# Patient Record
Sex: Female | Born: 1940 | Race: White | Hispanic: No | Marital: Married | Smoking: Never smoker
Health system: Southern US, Community
[De-identification: ages and names within clinical notes are randomized; demographics above are authoritative.]

## PROBLEM LIST (undated history)

## (undated) DIAGNOSIS — N1330 Unspecified hydronephrosis: Secondary | ICD-10-CM

## (undated) DIAGNOSIS — E785 Hyperlipidemia, unspecified: Secondary | ICD-10-CM

## (undated) DIAGNOSIS — K219 Gastro-esophageal reflux disease without esophagitis: Secondary | ICD-10-CM

## (undated) DIAGNOSIS — K222 Esophageal obstruction: Secondary | ICD-10-CM

## (undated) DIAGNOSIS — K635 Polyp of colon: Secondary | ICD-10-CM

## (undated) DIAGNOSIS — G231 Progressive supranuclear ophthalmoplegia [Steele-Richardson-Olszewski]: Secondary | ICD-10-CM

## (undated) DIAGNOSIS — N319 Neuromuscular dysfunction of bladder, unspecified: Secondary | ICD-10-CM

## (undated) DIAGNOSIS — G709 Myoneural disorder, unspecified: Secondary | ICD-10-CM

## (undated) DIAGNOSIS — E876 Hypokalemia: Secondary | ICD-10-CM

## (undated) DIAGNOSIS — K648 Other hemorrhoids: Secondary | ICD-10-CM

## (undated) DIAGNOSIS — I10 Essential (primary) hypertension: Secondary | ICD-10-CM

## (undated) DIAGNOSIS — M1990 Unspecified osteoarthritis, unspecified site: Secondary | ICD-10-CM

## (undated) DIAGNOSIS — G20 Parkinson's disease: Secondary | ICD-10-CM

## (undated) DIAGNOSIS — I6381 Other cerebral infarction due to occlusion or stenosis of small artery: Secondary | ICD-10-CM

## (undated) DIAGNOSIS — G20C Parkinsonism, unspecified: Secondary | ICD-10-CM

## (undated) DIAGNOSIS — N390 Urinary tract infection, site not specified: Secondary | ICD-10-CM

## (undated) HISTORY — PX: OTHER SURGICAL HISTORY: SHX169

## (undated) HISTORY — DX: Polyp of colon: K63.5

## (undated) HISTORY — DX: Urinary tract infection, site not specified: N39.0

## (undated) HISTORY — DX: Essential (primary) hypertension: I10

## (undated) HISTORY — DX: Progressive supranuclear ophthalmoplegia (steele-Richardson-olszewski): G23.1

## (undated) HISTORY — DX: Unspecified osteoarthritis, unspecified site: M19.90

## (undated) HISTORY — DX: Parkinson's disease: G20

## (undated) HISTORY — DX: Parkinsonism, unspecified: G20.C

## (undated) HISTORY — DX: Other cerebral infarction due to occlusion or stenosis of small artery: I63.81

## (undated) HISTORY — DX: Unspecified hydronephrosis: N13.30

## (undated) HISTORY — PX: TUBAL LIGATION: SHX77

## (undated) HISTORY — DX: Myoneural disorder, unspecified: G70.9

## (undated) HISTORY — DX: Hyperlipidemia, unspecified: E78.5

## (undated) HISTORY — DX: Other hemorrhoids: K64.8

## (undated) HISTORY — DX: Hypokalemia: E87.6

## (undated) HISTORY — DX: Gastro-esophageal reflux disease without esophagitis: K21.9

## (undated) HISTORY — DX: Neuromuscular dysfunction of bladder, unspecified: N31.9

## (undated) HISTORY — PX: CARPAL TUNNEL RELEASE: SHX101

## (undated) HISTORY — PX: CHOLECYSTECTOMY: SHX55

## (undated) HISTORY — PX: ESOPHAGEAL DILATION: SHX303

## (undated) HISTORY — DX: Esophageal obstruction: K22.2

## (undated) HISTORY — PX: KNEE SURGERY: SHX244

## (undated) HISTORY — PX: BREAST SURGERY: SHX581

---

## 1997-05-14 ENCOUNTER — Other Ambulatory Visit: Admission: RE | Admit: 1997-05-14 | Discharge: 1997-05-14 | Payer: Self-pay | Admitting: Gynecology

## 1997-06-04 ENCOUNTER — Encounter: Payer: Self-pay | Admitting: Internal Medicine

## 1998-05-29 ENCOUNTER — Emergency Department (HOSPITAL_COMMUNITY): Admission: EM | Admit: 1998-05-29 | Discharge: 1998-05-29 | Payer: Self-pay | Admitting: Emergency Medicine

## 1998-05-30 ENCOUNTER — Other Ambulatory Visit: Admission: RE | Admit: 1998-05-30 | Discharge: 1998-05-30 | Payer: Self-pay | Admitting: Gynecology

## 1998-12-23 ENCOUNTER — Other Ambulatory Visit: Admission: RE | Admit: 1998-12-23 | Discharge: 1998-12-23 | Payer: Self-pay | Admitting: Gynecology

## 1999-01-16 ENCOUNTER — Ambulatory Visit (HOSPITAL_COMMUNITY): Admission: RE | Admit: 1999-01-16 | Discharge: 1999-01-16 | Payer: Self-pay | Admitting: Internal Medicine

## 1999-01-16 ENCOUNTER — Encounter: Payer: Self-pay | Admitting: Internal Medicine

## 1999-07-24 ENCOUNTER — Other Ambulatory Visit: Admission: RE | Admit: 1999-07-24 | Discharge: 1999-07-24 | Payer: Self-pay | Admitting: Gynecology

## 1999-07-24 ENCOUNTER — Encounter: Admission: RE | Admit: 1999-07-24 | Discharge: 1999-07-24 | Payer: Self-pay | Admitting: Gynecology

## 1999-07-24 ENCOUNTER — Encounter: Payer: Self-pay | Admitting: Gynecology

## 1999-08-14 ENCOUNTER — Other Ambulatory Visit: Admission: RE | Admit: 1999-08-14 | Discharge: 1999-08-14 | Payer: Self-pay | Admitting: Gynecology

## 1999-08-14 ENCOUNTER — Encounter (INDEPENDENT_AMBULATORY_CARE_PROVIDER_SITE_OTHER): Payer: Self-pay

## 1999-12-22 ENCOUNTER — Other Ambulatory Visit: Admission: RE | Admit: 1999-12-22 | Discharge: 1999-12-22 | Payer: Self-pay | Admitting: Gynecology

## 2000-07-26 ENCOUNTER — Encounter: Payer: Self-pay | Admitting: Gynecology

## 2000-07-26 ENCOUNTER — Encounter: Admission: RE | Admit: 2000-07-26 | Discharge: 2000-07-26 | Payer: Self-pay | Admitting: Gynecology

## 2000-07-26 ENCOUNTER — Other Ambulatory Visit: Admission: RE | Admit: 2000-07-26 | Discharge: 2000-07-26 | Payer: Self-pay | Admitting: Gynecology

## 2001-03-25 ENCOUNTER — Other Ambulatory Visit: Admission: RE | Admit: 2001-03-25 | Discharge: 2001-03-25 | Payer: Self-pay | Admitting: Gynecology

## 2001-07-24 ENCOUNTER — Emergency Department (HOSPITAL_COMMUNITY): Admission: EM | Admit: 2001-07-24 | Discharge: 2001-07-24 | Payer: Self-pay | Admitting: Emergency Medicine

## 2001-07-24 ENCOUNTER — Encounter: Payer: Self-pay | Admitting: Emergency Medicine

## 2002-03-27 ENCOUNTER — Other Ambulatory Visit: Admission: RE | Admit: 2002-03-27 | Discharge: 2002-03-27 | Payer: Self-pay | Admitting: Gynecology

## 2002-06-02 ENCOUNTER — Emergency Department (HOSPITAL_COMMUNITY): Admission: EM | Admit: 2002-06-02 | Discharge: 2002-06-03 | Payer: Self-pay | Admitting: Emergency Medicine

## 2002-06-03 ENCOUNTER — Encounter: Payer: Self-pay | Admitting: Emergency Medicine

## 2002-06-08 ENCOUNTER — Ambulatory Visit (HOSPITAL_COMMUNITY): Admission: RE | Admit: 2002-06-08 | Discharge: 2002-06-08 | Payer: Self-pay | Admitting: Internal Medicine

## 2002-06-08 ENCOUNTER — Encounter: Payer: Self-pay | Admitting: Internal Medicine

## 2002-07-25 ENCOUNTER — Encounter: Payer: Self-pay | Admitting: Gynecology

## 2002-07-25 ENCOUNTER — Encounter: Admission: RE | Admit: 2002-07-25 | Discharge: 2002-07-25 | Payer: Self-pay | Admitting: Gynecology

## 2003-04-24 ENCOUNTER — Encounter: Admission: RE | Admit: 2003-04-24 | Discharge: 2003-04-24 | Payer: Self-pay | Admitting: Internal Medicine

## 2003-05-02 ENCOUNTER — Encounter: Admission: RE | Admit: 2003-05-02 | Discharge: 2003-05-02 | Payer: Self-pay | Admitting: Internal Medicine

## 2003-06-11 ENCOUNTER — Other Ambulatory Visit: Admission: RE | Admit: 2003-06-11 | Discharge: 2003-06-11 | Payer: Self-pay | Admitting: Gynecology

## 2003-12-07 ENCOUNTER — Ambulatory Visit: Payer: Self-pay | Admitting: Internal Medicine

## 2004-07-08 ENCOUNTER — Ambulatory Visit: Payer: Self-pay | Admitting: Internal Medicine

## 2004-07-18 ENCOUNTER — Ambulatory Visit: Payer: Self-pay | Admitting: Internal Medicine

## 2004-08-04 ENCOUNTER — Encounter: Admission: RE | Admit: 2004-08-04 | Discharge: 2004-08-04 | Payer: Self-pay | Admitting: Gynecology

## 2004-08-04 ENCOUNTER — Other Ambulatory Visit: Admission: RE | Admit: 2004-08-04 | Discharge: 2004-08-04 | Payer: Self-pay | Admitting: Gynecology

## 2004-11-18 ENCOUNTER — Ambulatory Visit: Payer: Self-pay | Admitting: Internal Medicine

## 2005-01-28 ENCOUNTER — Ambulatory Visit: Payer: Self-pay | Admitting: Internal Medicine

## 2005-02-09 ENCOUNTER — Ambulatory Visit: Payer: Self-pay | Admitting: Internal Medicine

## 2005-03-04 ENCOUNTER — Ambulatory Visit: Payer: Self-pay | Admitting: Internal Medicine

## 2005-03-09 ENCOUNTER — Ambulatory Visit: Payer: Self-pay | Admitting: Internal Medicine

## 2005-07-09 ENCOUNTER — Ambulatory Visit (HOSPITAL_COMMUNITY): Admission: RE | Admit: 2005-07-09 | Discharge: 2005-07-09 | Payer: Self-pay | Admitting: Gastroenterology

## 2005-07-09 ENCOUNTER — Encounter: Payer: Self-pay | Admitting: Gastroenterology

## 2005-07-14 ENCOUNTER — Ambulatory Visit: Payer: Self-pay | Admitting: Gastroenterology

## 2005-08-18 ENCOUNTER — Other Ambulatory Visit: Admission: RE | Admit: 2005-08-18 | Discharge: 2005-08-18 | Payer: Self-pay | Admitting: Gynecology

## 2006-05-31 ENCOUNTER — Ambulatory Visit: Payer: Self-pay | Admitting: Internal Medicine

## 2006-08-09 ENCOUNTER — Ambulatory Visit: Payer: Self-pay | Admitting: Internal Medicine

## 2006-08-09 DIAGNOSIS — E8881 Metabolic syndrome: Secondary | ICD-10-CM

## 2006-08-13 ENCOUNTER — Encounter (INDEPENDENT_AMBULATORY_CARE_PROVIDER_SITE_OTHER): Payer: Self-pay | Admitting: *Deleted

## 2006-08-17 ENCOUNTER — Ambulatory Visit: Payer: Self-pay | Admitting: Internal Medicine

## 2006-08-18 ENCOUNTER — Encounter (INDEPENDENT_AMBULATORY_CARE_PROVIDER_SITE_OTHER): Payer: Self-pay | Admitting: *Deleted

## 2006-09-02 ENCOUNTER — Encounter: Admission: RE | Admit: 2006-09-02 | Discharge: 2006-09-02 | Payer: Self-pay | Admitting: Gynecology

## 2006-09-02 ENCOUNTER — Encounter: Payer: Self-pay | Admitting: Internal Medicine

## 2006-09-02 ENCOUNTER — Other Ambulatory Visit: Admission: RE | Admit: 2006-09-02 | Discharge: 2006-09-02 | Payer: Self-pay | Admitting: Gynecology

## 2007-04-13 ENCOUNTER — Telehealth (INDEPENDENT_AMBULATORY_CARE_PROVIDER_SITE_OTHER): Payer: Self-pay | Admitting: *Deleted

## 2007-08-01 ENCOUNTER — Encounter: Payer: Self-pay | Admitting: Internal Medicine

## 2007-09-07 DIAGNOSIS — J45909 Unspecified asthma, uncomplicated: Secondary | ICD-10-CM | POA: Insufficient documentation

## 2007-09-08 ENCOUNTER — Ambulatory Visit: Payer: Self-pay | Admitting: Internal Medicine

## 2007-09-08 DIAGNOSIS — E781 Pure hyperglyceridemia: Secondary | ICD-10-CM | POA: Insufficient documentation

## 2007-09-12 ENCOUNTER — Encounter (INDEPENDENT_AMBULATORY_CARE_PROVIDER_SITE_OTHER): Payer: Self-pay | Admitting: *Deleted

## 2007-09-19 ENCOUNTER — Encounter (INDEPENDENT_AMBULATORY_CARE_PROVIDER_SITE_OTHER): Payer: Self-pay | Admitting: *Deleted

## 2007-09-21 ENCOUNTER — Ambulatory Visit: Payer: Self-pay | Admitting: Internal Medicine

## 2007-09-21 ENCOUNTER — Encounter (INDEPENDENT_AMBULATORY_CARE_PROVIDER_SITE_OTHER): Payer: Self-pay | Admitting: *Deleted

## 2007-09-21 LAB — CONVERTED CEMR LAB
OCCULT 1: NEGATIVE
OCCULT 2: NEGATIVE
OCCULT 3: NEGATIVE

## 2007-11-24 ENCOUNTER — Encounter: Admission: RE | Admit: 2007-11-24 | Discharge: 2007-11-24 | Payer: Self-pay | Admitting: Gynecology

## 2008-01-27 HISTORY — PX: OTHER SURGICAL HISTORY: SHX169

## 2008-01-27 LAB — HM COLONOSCOPY

## 2008-05-09 DIAGNOSIS — K649 Unspecified hemorrhoids: Secondary | ICD-10-CM | POA: Insufficient documentation

## 2008-05-09 DIAGNOSIS — K222 Esophageal obstruction: Secondary | ICD-10-CM

## 2008-05-10 ENCOUNTER — Ambulatory Visit: Payer: Self-pay | Admitting: Internal Medicine

## 2008-05-10 DIAGNOSIS — R1319 Other dysphagia: Secondary | ICD-10-CM | POA: Insufficient documentation

## 2008-05-10 DIAGNOSIS — K219 Gastro-esophageal reflux disease without esophagitis: Secondary | ICD-10-CM

## 2008-06-06 ENCOUNTER — Ambulatory Visit: Payer: Self-pay | Admitting: Internal Medicine

## 2008-07-02 ENCOUNTER — Encounter: Payer: Self-pay | Admitting: Internal Medicine

## 2008-07-02 ENCOUNTER — Ambulatory Visit: Payer: Self-pay | Admitting: Internal Medicine

## 2008-07-03 ENCOUNTER — Encounter: Payer: Self-pay | Admitting: Internal Medicine

## 2008-07-26 ENCOUNTER — Encounter: Payer: Self-pay | Admitting: Internal Medicine

## 2008-09-13 ENCOUNTER — Ambulatory Visit: Payer: Self-pay | Admitting: Internal Medicine

## 2008-09-13 DIAGNOSIS — R7309 Other abnormal glucose: Secondary | ICD-10-CM

## 2008-09-13 DIAGNOSIS — M810 Age-related osteoporosis without current pathological fracture: Secondary | ICD-10-CM | POA: Insufficient documentation

## 2008-09-13 DIAGNOSIS — M1990 Unspecified osteoarthritis, unspecified site: Secondary | ICD-10-CM | POA: Insufficient documentation

## 2008-09-17 ENCOUNTER — Encounter (INDEPENDENT_AMBULATORY_CARE_PROVIDER_SITE_OTHER): Payer: Self-pay | Admitting: *Deleted

## 2008-10-29 ENCOUNTER — Encounter: Payer: Self-pay | Admitting: Internal Medicine

## 2008-11-09 ENCOUNTER — Encounter: Payer: Self-pay | Admitting: Internal Medicine

## 2009-04-01 ENCOUNTER — Ambulatory Visit: Payer: Self-pay | Admitting: Internal Medicine

## 2009-04-01 DIAGNOSIS — M545 Low back pain: Secondary | ICD-10-CM

## 2009-09-01 ENCOUNTER — Encounter: Admission: RE | Admit: 2009-09-01 | Discharge: 2009-09-01 | Payer: Self-pay | Admitting: Neurosurgery

## 2009-09-26 DIAGNOSIS — I6381 Other cerebral infarction due to occlusion or stenosis of small artery: Secondary | ICD-10-CM

## 2009-09-26 HISTORY — DX: Other cerebral infarction due to occlusion or stenosis of small artery: I63.81

## 2009-10-15 ENCOUNTER — Encounter: Payer: Self-pay | Admitting: Internal Medicine

## 2010-01-29 ENCOUNTER — Telehealth: Payer: Self-pay | Admitting: Internal Medicine

## 2010-02-23 LAB — CONVERTED CEMR LAB
ALT: 17 units/L (ref 0–35)
ALT: 18 units/L (ref 0–35)
ALT: 22 units/L (ref 0–35)
AST: 22 units/L (ref 0–37)
AST: 23 units/L (ref 0–37)
Albumin: 3.6 g/dL (ref 3.5–5.2)
Albumin: 3.6 g/dL (ref 3.5–5.2)
Alkaline Phosphatase: 88 units/L (ref 39–117)
Alkaline Phosphatase: 88 units/L (ref 39–117)
BUN: 10 mg/dL (ref 6–23)
Basophils Absolute: 0.1 10*3/uL (ref 0.0–0.1)
Basophils Relative: 0.9 % (ref 0.0–1.0)
Basophils Relative: 0.9 % (ref 0.0–3.0)
Basophils Relative: 1 % (ref 0.0–3.0)
Bilirubin, Direct: 0.1 mg/dL (ref 0.0–0.3)
CO2: 31 meq/L (ref 19–32)
CO2: 31 meq/L (ref 19–32)
Calcium: 8.7 mg/dL (ref 8.4–10.5)
Cholesterol: 167 mg/dL (ref 0–200)
Cholesterol: 177 mg/dL (ref 0–200)
Cholesterol: 182 mg/dL (ref 0–200)
Creatinine, Ser: 0.6 mg/dL (ref 0.4–1.2)
Creatinine, Ser: 0.7 mg/dL (ref 0.4–1.2)
Creatinine,U: 197.8 mg/dL
Direct LDL: 117.3 mg/dL
Direct LDL: 88 mg/dL
Eosinophils Absolute: 0.4 10*3/uL (ref 0.0–0.7)
Eosinophils Absolute: 0.4 10*3/uL (ref 0.0–0.7)
Eosinophils Relative: 4.8 % (ref 0.0–5.0)
Eosinophils Relative: 5.3 % — ABNORMAL HIGH (ref 0.0–5.0)
Eosinophils Relative: 6.2 % — ABNORMAL HIGH (ref 0.0–5.0)
GFR calc Af Amer: 108 mL/min
GFR calc non Af Amer: 106.15 mL/min (ref >60)
HCT: 41.5 % (ref 36.0–46.0)
HCT: 41.7 % (ref 36.0–46.0)
Hemoglobin: 14.4 g/dL (ref 12.0–15.0)
Hgb A1c MFr Bld: 6.3 % — ABNORMAL HIGH (ref 4.6–6.0)
Lymphocytes Relative: 18.4 % (ref 12.0–46.0)
Lymphocytes Relative: 19.3 % (ref 12.0–46.0)
MCHC: 34 g/dL (ref 30.0–36.0)
MCHC: 34.9 g/dL (ref 30.0–36.0)
MCV: 90.3 fL (ref 78.0–100.0)
Microalb, Ur: 0.9 mg/dL (ref 0.0–1.9)
Monocytes Absolute: 0.7 10*3/uL (ref 0.1–1.0)
Monocytes Absolute: 0.7 10*3/uL (ref 0.1–1.0)
Monocytes Relative: 10.6 % (ref 3.0–11.0)
Monocytes Relative: 8.7 % (ref 3.0–12.0)
Monocytes Relative: 8.8 % (ref 3.0–12.0)
Neutro Abs: 5.1 10*3/uL (ref 1.4–7.7)
Neutro Abs: 5.7 10*3/uL (ref 1.4–7.7)
Neutrophils Relative %: 64.5 % (ref 43.0–77.0)
Neutrophils Relative %: 66.5 % (ref 43.0–77.0)
Platelets: 233 10*3/uL (ref 150–400)
Platelets: 278 10*3/uL (ref 150–400)
Potassium: 3.8 meq/L (ref 3.5–5.1)
RBC: 4.68 M/uL (ref 3.87–5.11)
RDW: 12.2 % (ref 11.5–14.6)
RDW: 12.5 % (ref 11.5–14.6)
TSH: 1.94 microintl units/mL (ref 0.35–5.50)
TSH: 3.2 microintl units/mL (ref 0.35–5.50)
Total Bilirubin: 0.7 mg/dL (ref 0.3–1.2)
Total Bilirubin: 0.7 mg/dL (ref 0.3–1.2)
Total CHOL/HDL Ratio: 5.2
Total Protein: 6.7 g/dL (ref 6.0–8.3)
Total Protein: 7.4 g/dL (ref 6.0–8.3)
Triglycerides: 208 mg/dL — ABNORMAL HIGH (ref 0.0–149.0)
VLDL: 41.6 mg/dL — ABNORMAL HIGH (ref 0.0–40.0)
VLDL: 50 mg/dL — ABNORMAL HIGH (ref 0–40)
Vit D, 25-Hydroxy: 33 ng/mL (ref 30–89)
WBC: 7.7 10*3/uL (ref 4.5–10.5)
WBC: 8.7 10*3/uL (ref 4.5–10.5)

## 2010-02-27 NOTE — Progress Notes (Signed)
Summary: Requesting to Switch PCP  Phone Note Call from Patient Call back at Home Phone [phone]   Caller: Patient Summary of Call: Pt requesting to switch from Dr. Alwyn Ren to Dr. Clent Ridges due to traveling arrangements. Initial call taken by: Trixie Dredge,  January 29, 2010 12:11 PM  Follow-up for Phone Call        certainly; I shall miss her  Follow-up by: Marga Melnick MD,  January 29, 2010 1:46 PM  Additional Follow-up for Phone Call Additional follow up Details #1::        Forward to Dr. Clent Ridges for approval. Lucious Groves CMA  January 29, 2010 2:45 PM     Additional Follow-up for Phone Call Additional follow up Details #2::    I would be happy to see her  Follow-up by: Nelwyn Salisbury MD,  January 31, 2010 10:05 AM  Additional Follow-up for Phone Call Additional follow up Details #3:: Details for Additional Follow-up Action Taken: Patient notified and notes that she still likes [facility], but due to her age she is getting as close as she can to home. Lucious Groves CMA  January 31, 2010 11:52 AM

## 2010-02-27 NOTE — Miscellaneous (Signed)
Summary: [facility]  [facility]   Imported By: Lanelle Bal 10/24/2009 09:41:11  _____________________________________________________________________  External Attachment:    Type:   Image     Comment:   External Document

## 2010-02-27 NOTE — Assessment & Plan Note (Signed)
Summary: backpain/kdc   Vital Signs:  Patient profile:   70 year old female Weight:      222.4 pounds BMI:     43.59 Temp:     98.3 degrees F oral Pulse rate:   72 / minute Resp:     17 per minute BP sitting:   114 / 68  (left arm) Cuff size:   large  Vitals Entered By: Shonna Chock (April 01, 2009 12:17 PM) CC: Back pain since June-2010, mainly on left side. Pain from the back will travel down the left leg at times. Comments REVIEWED MED LIST, PATIENT AGREED DOSE AND INSTRUCTION CORRECT    Primary Care Provider:  Marga Melnick MD  CC:  Back pain since June-2010 and mainly on left side. Pain from the back will travel down the left leg at times..  History of Present Illness: LS  pain as "stretching, worse than having a baby"  > 6 months with intermittent LLE radiation to below knee & occasionally to R LS area. No PMH of trauma to back. She did have syncope ? from dysphagia in 06/2008. S/P esophageal dilation.Rx: Tylenol  & topical heat  help , but pain progressing, worse walking & better leaning forward  Allergies: 1)  Sulfa 2)  * Ppi 3)  * Hctz  Review of Systems General:  Complains of sweats; denies chills, fever, and weight loss. GI:  Complains of gas and hemorrhoids; denies abdominal pain, bloody stools, dark tarry stools, and indigestion. GU:  Complains of urinary hesitancy; denies abnormal vaginal bleeding, discharge, dysuria, hematuria, and incontinence. Derm:  Denies lesion(s) and rash. Neuro:  Complains of numbness; denies brief paralysis, tingling, and weakness; Numbness L foot after sitting for a while.  Physical Exam  General:  in no acute distress; alert,appropriate and cooperative throughout examination Abdomen:  Bowel sounds positive,abdomen soft and non-tender without masses, organomegaly or hernias noted. Msk:  No deformity or scoliosis noted of thoracic or lumbar spine.  Sat up& lay down w/o help Extremities:  No clubbing, cyanosis, edema. Neg SLR ;  crepitus of knees Neurologic:  alert & oriented X3, strength ? decreased LLE, gait(including toe/heel walking)  normal, and DTRs symmetrical and normal.   Skin:  Intact without suspicious lesions or rashes   Impression & Recommendations:  Problem # 1:  LOW BACK PAIN, CHRONIC (ICD-724.2) R/O Spinal Stenosis Her updated medication list for this problem includes:    Tylenol Extra Strength 500 Mg Tabs (Acetaminophen) .Marland Kitchen... 3-4 x daily    Tramadol Hcl 50 Mg Tabs (Tramadol hcl) .Marland Kitchen... 1/2 -1  q 6 hrs as needed  Orders: T-Lumbar Spine Complete, 5 Views [id number]) Prescription Created Electronically [id number])  Problem # 2:  OSTEOPOROSIS (ICD-733.00)  Complete Medication List: 1)  Metoprolol Tartrate 100 Mg Tabs (Metoprolol tartrate) .... 1/2 bid 2)  Triamterene-hctz 37.5-25 Mg Tabs (Triamterene-hctz) .... 1/2 tab qd 3)  Vitamin D3 2000 Unit Caps (Cholecalciferol) .Marland Kitchen.. 1 by mouth once daily 4)  Tylenol Extra Strength 500 Mg Tabs (Acetaminophen) .... 3-4 x daily 5)  Advair Diskus 100-50 Mcg/dose Misc (Fluticasone-salmeterol) .Marland Kitchen.. 1 inhalation q 12 hrs ; gargle & swallow after use 6)  Tramadol Hcl 50 Mg Tabs (Tramadol hcl) .... 1/2 -1  q 6 hrs as needed  Other Orders: UA Dipstick w/o Micro (manual) ([id number])  Patient Instructions: 1)  Complete Xrays @ [facility] . Prescriptions: TRAMADOL HCL 50 MG TABS (TRAMADOL HCL) 1/2 -1  q 6 hrs as needed  #30 x 1  Entered and Authorized by:   Marga Melnick MD   Signed by:   Marga Melnick MD on 04/01/2009   Method used:   Faxed to ...       [facility] [address] [phone]* (retail)       [address] [place]  [address]       Ph: [phone]       Fax: [phone]   RxID:   [id number]   Laboratory Results

## 2010-03-26 ENCOUNTER — Ambulatory Visit (INDEPENDENT_AMBULATORY_CARE_PROVIDER_SITE_OTHER): Payer: Medicare Other | Admitting: Family Medicine

## 2010-03-26 ENCOUNTER — Encounter: Payer: Self-pay | Admitting: Family Medicine

## 2010-03-26 VITALS — BP 94/60 | HR 93 | Temp 98.2°F | Resp 12 | Ht 61.0 in | Wt 215.0 lb

## 2010-03-26 DIAGNOSIS — R7309 Other abnormal glucose: Secondary | ICD-10-CM

## 2010-03-26 DIAGNOSIS — R5383 Other fatigue: Secondary | ICD-10-CM

## 2010-03-26 DIAGNOSIS — R739 Hyperglycemia, unspecified: Secondary | ICD-10-CM

## 2010-03-26 DIAGNOSIS — E785 Hyperlipidemia, unspecified: Secondary | ICD-10-CM

## 2010-03-26 DIAGNOSIS — M899 Disorder of bone, unspecified: Secondary | ICD-10-CM

## 2010-03-26 DIAGNOSIS — R5381 Other malaise: Secondary | ICD-10-CM

## 2010-03-26 DIAGNOSIS — I10 Essential (primary) hypertension: Secondary | ICD-10-CM

## 2010-03-26 DIAGNOSIS — R531 Weakness: Secondary | ICD-10-CM

## 2010-03-26 DIAGNOSIS — M8580 Other specified disorders of bone density and structure, unspecified site: Secondary | ICD-10-CM

## 2010-03-26 LAB — CBC WITH DIFFERENTIAL/PLATELET
Basophils Absolute: 0 10*3/uL (ref 0.0–0.1)
Basophils Relative: 0.4 % (ref 0.0–3.0)
Eosinophils Absolute: 0.6 10*3/uL (ref 0.0–0.7)
Lymphocytes Relative: 21 % (ref 12.0–46.0)
Lymphs Abs: 1.8 10*3/uL (ref 0.7–4.0)
Platelets: 222 10*3/uL (ref 150.0–400.0)
RBC: 4.17 Mil/uL (ref 3.87–5.11)
WBC: 8.6 10*3/uL (ref 4.5–10.5)

## 2010-03-26 LAB — TSH: TSH: 1.72 u[IU]/mL (ref 0.35–5.50)

## 2010-03-26 LAB — HEPATIC FUNCTION PANEL
ALT: 18 U/L (ref 0–35)
Albumin: 3.9 g/dL (ref 3.5–5.2)
Alkaline Phosphatase: 71 U/L (ref 39–117)
Bilirubin, Direct: 0.1 mg/dL (ref 0.0–0.3)
Total Protein: 6.6 g/dL (ref 6.0–8.3)

## 2010-03-26 LAB — LIPID PANEL
Cholesterol: 187 mg/dL (ref 0–200)
Total CHOL/HDL Ratio: 4
Triglycerides: 264 mg/dL — ABNORMAL HIGH (ref 0.0–149.0)
VLDL: 52.8 mg/dL — ABNORMAL HIGH (ref 0.0–40.0)

## 2010-03-26 LAB — BASIC METABOLIC PANEL
Calcium: 8.9 mg/dL (ref 8.4–10.5)
Creatinine, Ser: 0.6 mg/dL (ref 0.4–1.2)
Glucose, Bld: 95 mg/dL (ref 70–99)
Sodium: 141 mEq/L (ref 135–145)

## 2010-03-26 LAB — POCT URINALYSIS DIPSTICK
Bilirubin, UA: NEGATIVE
Glucose, UA: NEGATIVE
Ketones, UA: NEGATIVE
Nitrite, UA: NEGATIVE
Spec Grav, UA: 1.02

## 2010-03-26 LAB — VITAMIN B12: Vitamin B-12: 961 pg/mL — ABNORMAL HIGH (ref 211–911)

## 2010-03-26 MED ORDER — OMEPRAZOLE 20 MG PO CPDR: 20.0000 mg | DELAYED_RELEASE_CAPSULE | Freq: Two times a day (BID) | ORAL | Status: DC

## 2010-03-26 MED ORDER — CALCIUM CARB-VIT D-SOY ISOFLAV 600-200-25 MG-UNIT-MG PO TABS: ORAL_TABLET | ORAL | Status: DC

## 2010-03-26 MED ORDER — LISINOPRIL-HYDROCHLOROTHIAZIDE 20-12.5 MG PO TABS: ORAL_TABLET | ORAL | Status: DC

## 2010-03-26 NOTE — Progress Notes (Signed)
  Subjective:    Patient ID: Katherine Ware, female    DOB: [birth year], 70 y.o.   MRN: [id number]  HPI 70 yr old female here with her husband to establish with [place]. She had seen Dr. Marga Melnick for years and then recently saw Dr. Maryelizabeth Rowan for several months. This patient had been doing well until one year ago when she started to develop some lower back pain. She has rapidly developed a number of other medical issues in the past 6 months. 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 She saw Dr. Newell Coral., and he gave her 2 epidural steroid injections, one in 08-2009 and another in 09-2009. These did not help her pain at all. Most of the pain is in the left lower back and the left leg. She also has significant weakness in the left leg which makes walking very difficult. Now in the past few months she has also developed weakness in the left arm and some mild generalized weakness. Her voice has been hoarse for no apparent reason. She had a head CT and a brain MRI without contrast in 11-2009, but these were unremarkable. Certainly no signs of a stroke were seen. She has had her BP meds adjusted a few times. She also complains of urgency to urinate, causing her to have to go to the bathroom every hour day and night. No burning or fever.    Review of Systems  Constitutional: Positive for activity change and fatigue. Negative for unexpected weight change.  HENT: Positive for voice change. Negative for congestion, sore throat, trouble swallowing, postnasal drip and sinus pressure.   Eyes: Negative.   Respiratory: Negative.   Cardiovascular: Negative.   Gastrointestinal: Negative.   Genitourinary: Positive for urgency and frequency. Negative for dysuria.  Musculoskeletal: Positive for back pain.  Neurological: Positive for speech difficulty and weakness. Negative for tremors, seizures and headaches.         Objective:   Physical Exam  Constitutional: She is oriented to person, place, and time.       Morbidly obese, walks very slowly, requires assistance to get up on the exam table  HENT:  Head: Normocephalic and atraumatic.  Eyes: EOM are normal. Pupils are equal, round, and reactive to light.  Neck: No thyromegaly present.  Cardiovascular: Regular rhythm, normal heart sounds and intact distal pulses.  Exam reveals no gallop and no friction rub.   No murmur heard. Pulmonary/Chest: Effort normal and breath sounds normal. No respiratory distress. She has no wheezes. She has no rales. She exhibits no tenderness.  Musculoskeletal:       3+ edema in both legs   Lymphadenopathy:    She has no cervical adenopathy.  Neurological: She is alert and oriented to person, place, and time. She exhibits abnormal muscle tone. Coordination abnormal.       Very weak diffusely          Assessment & Plan:  This patient has a number of issues currently, including low back pain, overactive bladder, and unexplained generalized weakness. We will get fasting labs today for a baseline. I suspect she may have a neurologic condition like myasthenia gravis, etc. So we will probably have Neurology see her soon.

## 2010-03-28 ENCOUNTER — Telehealth: Payer: Self-pay | Admitting: Family Medicine

## 2010-03-28 DIAGNOSIS — R531 Weakness: Secondary | ICD-10-CM

## 2010-03-28 NOTE — Telephone Encounter (Signed)
Requesting lab results done yesterday, home phone not working please call me at son's house [phone]

## 2010-03-28 NOTE — Progress Notes (Signed)
LMTCB for labs. 

## 2010-03-31 ENCOUNTER — Telehealth: Payer: Self-pay | Admitting: *Deleted

## 2010-03-31 NOTE — Telephone Encounter (Signed)
Pt called back again and is still needing to get lab results. Pls call back on pts sons #[phone]. Pt will be at this number til 11:15am today and then again after 2pm.

## 2010-03-31 NOTE — Telephone Encounter (Signed)
Left message on machine for patient  On home phone

## 2010-03-31 NOTE — Telephone Encounter (Signed)
Would like to have complete lab results.........did not understand. [phone] until 5: 30.  Then to her house.

## 2010-04-03 NOTE — Telephone Encounter (Signed)
Pt would like a referral to neurologist for left side weakness. Pt will be at her son house call back [phone]

## 2010-04-04 ENCOUNTER — Other Ambulatory Visit: Payer: Self-pay | Admitting: Family Medicine

## 2010-04-04 NOTE — Telephone Encounter (Signed)
Triage vm------called yesterday. Checking on status of a referral of urolgist. Please advise about her cholesterol being elevated. Requesting meds.

## 2010-04-04 NOTE — Telephone Encounter (Signed)
Call in Simvastatin 40 mg qd , #30 with 11 rf. Recheck labs in 90 days

## 2010-04-08 MED ORDER — ATORVASTATIN CALCIUM 20 MG PO TABS: 20.0000 mg | ORAL_TABLET | Freq: Every day | ORAL | Status: DC

## 2010-04-08 NOTE — Telephone Encounter (Signed)
Spoke with pt about her rx for simvastatin and she stated Dr Duanne Guess had her on this and it made her hair come out. Pls advise.

## 2010-04-08 NOTE — Telephone Encounter (Signed)
Change from Simvastatin to Lipitor 20 mg qd ,#30 with 11 rf

## 2010-04-08 NOTE — Telephone Encounter (Signed)
Pt aware of med changed and called to [facility].  Also, pt question about referral to neurologist. Referral is in the works and advised pt to call back in 3 days to office if no word from [facility].

## 2010-04-11 NOTE — Telephone Encounter (Signed)
Spoke with patient.

## 2010-04-24 ENCOUNTER — Ambulatory Visit: Payer: Medicare Other | Admitting: Physical Therapy

## 2010-04-24 ENCOUNTER — Ambulatory Visit: Payer: Medicare Other | Attending: Neurology | Admitting: Occupational Therapy

## 2010-04-24 DIAGNOSIS — Z5189 Encounter for other specified aftercare: Secondary | ICD-10-CM | POA: Insufficient documentation

## 2010-04-24 DIAGNOSIS — I69919 Unspecified symptoms and signs involving cognitive functions following unspecified cerebrovascular disease: Secondary | ICD-10-CM | POA: Insufficient documentation

## 2010-04-24 DIAGNOSIS — M6281 Muscle weakness (generalized): Secondary | ICD-10-CM | POA: Insufficient documentation

## 2010-04-24 DIAGNOSIS — I69998 Other sequelae following unspecified cerebrovascular disease: Secondary | ICD-10-CM | POA: Insufficient documentation

## 2010-04-24 DIAGNOSIS — R269 Unspecified abnormalities of gait and mobility: Secondary | ICD-10-CM | POA: Insufficient documentation

## 2010-04-24 DIAGNOSIS — R279 Unspecified lack of coordination: Secondary | ICD-10-CM | POA: Insufficient documentation

## 2010-04-28 ENCOUNTER — Ambulatory Visit: Payer: Medicare Other | Attending: Neurology | Admitting: Occupational Therapy

## 2010-04-28 DIAGNOSIS — R269 Unspecified abnormalities of gait and mobility: Secondary | ICD-10-CM | POA: Insufficient documentation

## 2010-04-28 DIAGNOSIS — I69998 Other sequelae following unspecified cerebrovascular disease: Secondary | ICD-10-CM | POA: Insufficient documentation

## 2010-04-28 DIAGNOSIS — Z5189 Encounter for other specified aftercare: Secondary | ICD-10-CM | POA: Insufficient documentation

## 2010-04-28 DIAGNOSIS — R279 Unspecified lack of coordination: Secondary | ICD-10-CM | POA: Insufficient documentation

## 2010-04-28 DIAGNOSIS — M6281 Muscle weakness (generalized): Secondary | ICD-10-CM | POA: Insufficient documentation

## 2010-04-28 DIAGNOSIS — I69919 Unspecified symptoms and signs involving cognitive functions following unspecified cerebrovascular disease: Secondary | ICD-10-CM | POA: Insufficient documentation

## 2010-04-29 ENCOUNTER — Encounter: Payer: Self-pay | Admitting: Family Medicine

## 2010-04-30 ENCOUNTER — Ambulatory Visit (INDEPENDENT_AMBULATORY_CARE_PROVIDER_SITE_OTHER): Payer: Medicare Other | Admitting: Family Medicine

## 2010-04-30 ENCOUNTER — Encounter: Payer: Self-pay | Admitting: Family Medicine

## 2010-04-30 VITALS — BP 130/84 | HR 88 | Temp 98.2°F

## 2010-04-30 DIAGNOSIS — G20 Parkinson's disease: Secondary | ICD-10-CM

## 2010-04-30 DIAGNOSIS — N3281 Overactive bladder: Secondary | ICD-10-CM

## 2010-04-30 DIAGNOSIS — N318 Other neuromuscular dysfunction of bladder: Secondary | ICD-10-CM

## 2010-04-30 MED ORDER — OXYBUTYNIN CHLORIDE 5 MG PO TABS: 5.0000 mg | ORAL_TABLET | Freq: Two times a day (BID) | ORAL | Status: DC

## 2010-04-30 NOTE — Progress Notes (Signed)
  Subjective:    Patient ID: Katherine Ware, female    DOB: 1940/06/28, 70 y.o.   MRN: [id number]  HPI Here to followup on bladder issues. She seems to have an overactive bladder with urgency and frequency. No burning or pain. She tried Information systems manager and this helped a lot, however it caused constipation. She saw Dr. Newell Coral, and he thinks she may have Parkinsons disease. She is set to see Dr. Pearlean Brownie soon, but Dr. Newell Coral went ahead and started her on Sinemet.    Review of Systems  Constitutional: Negative.   Respiratory: Negative.   Cardiovascular: Negative.   Gastrointestinal: Negative.   Genitourinary: Positive for frequency. Negative for dysuria.       Objective:   Physical Exam  Constitutional: She is oriented to person, place, and time. She appears well-developed and well-nourished.  Abdominal: Soft. Bowel sounds are normal.  Neurological: She is alert and oriented to person, place, and time. She exhibits normal muscle tone. Coordination normal.          Assessment & Plan:  Try Ditropan bid and let me know.

## 2010-05-01 ENCOUNTER — Ambulatory Visit: Payer: Medicare Other | Admitting: Occupational Therapy

## 2010-05-05 ENCOUNTER — Ambulatory Visit: Payer: Medicare Other | Admitting: Occupational Therapy

## 2010-05-07 ENCOUNTER — Ambulatory Visit: Payer: Medicare Other | Admitting: Occupational Therapy

## 2010-05-09 ENCOUNTER — Ambulatory Visit: Payer: Medicare Other | Admitting: Physical Therapy

## 2010-05-13 ENCOUNTER — Ambulatory Visit: Payer: Medicare Other | Admitting: Occupational Therapy

## 2010-05-13 ENCOUNTER — Ambulatory Visit: Payer: Medicare Other | Admitting: Physical Therapy

## 2010-05-16 ENCOUNTER — Ambulatory Visit: Payer: Medicare Other | Admitting: Physical Therapy

## 2010-05-16 ENCOUNTER — Ambulatory Visit: Payer: Medicare Other | Admitting: Occupational Therapy

## 2010-05-20 ENCOUNTER — Ambulatory Visit: Payer: Medicare Other | Admitting: Occupational Therapy

## 2010-05-20 ENCOUNTER — Ambulatory Visit: Payer: Medicare Other | Admitting: Physical Therapy

## 2010-05-23 ENCOUNTER — Ambulatory Visit: Payer: Medicare Other | Admitting: Occupational Therapy

## 2010-05-23 ENCOUNTER — Ambulatory Visit: Payer: Medicare Other | Admitting: Physical Therapy

## 2010-05-26 ENCOUNTER — Ambulatory Visit (INDEPENDENT_AMBULATORY_CARE_PROVIDER_SITE_OTHER): Payer: Medicare Other | Admitting: Family Medicine

## 2010-05-26 ENCOUNTER — Ambulatory Visit: Payer: BC Managed Care – PPO | Admitting: Physical Therapy

## 2010-05-26 ENCOUNTER — Encounter: Payer: Self-pay | Admitting: Family Medicine

## 2010-05-26 ENCOUNTER — Ambulatory Visit: Payer: Medicare Other | Admitting: Occupational Therapy

## 2010-05-26 VITALS — BP 122/72 | HR 81 | Temp 98.1°F | Wt 212.0 lb

## 2010-05-26 DIAGNOSIS — M79673 Pain in unspecified foot: Secondary | ICD-10-CM

## 2010-05-26 DIAGNOSIS — I10 Essential (primary) hypertension: Secondary | ICD-10-CM

## 2010-05-26 DIAGNOSIS — I639 Cerebral infarction, unspecified: Secondary | ICD-10-CM

## 2010-05-26 DIAGNOSIS — M79609 Pain in unspecified limb: Secondary | ICD-10-CM

## 2010-05-26 DIAGNOSIS — I6350 Cerebral infarction due to unspecified occlusion or stenosis of unspecified cerebral artery: Secondary | ICD-10-CM

## 2010-05-26 MED ORDER — METOPROLOL SUCCINATE ER 100 MG PO TB24: 50.0000 mg | ORAL_TABLET | Freq: Two times a day (BID) | ORAL | Status: DC

## 2010-05-26 MED ORDER — DICLOFENAC SODIUM 1 % TD GEL: 1.0000 "application " | Freq: Four times a day (QID) | TRANSDERMAL | Status: DC

## 2010-05-26 MED ORDER — LISINOPRIL 20 MG PO TABS: 20.0000 mg | ORAL_TABLET | Freq: Every day | ORAL | Status: DC

## 2010-05-26 MED ORDER — HYDROCHLOROTHIAZIDE 25 MG PO TABS: 25.0000 mg | ORAL_TABLET | Freq: Every day | ORAL | Status: DC

## 2010-05-26 NOTE — Progress Notes (Signed)
  Subjective:    Patient ID: Katherine Ware, female    DOB: [birth year], 70 y.o.   MRN: [id number]  HPI Here for several  Issues. First she has had biltaeral heel pain for about one year, but the left heel is now much worse. She is getting PT and is on her feet much more lately than usual. The heel hurts the worst around the back edge of it. Second, she asks that we separate out her BP prescriptions so that the diuretic is taken alone. This gives her the flexibility to not take it if she is going out that day.    Review of Systems  Constitutional: Negative.   Respiratory: Negative.   Cardiovascular: Negative.   Musculoskeletal: Positive for arthralgias.       Objective:   Physical Exam  Constitutional:       Walks slowly with a cane  Cardiovascular: Normal rate, regular rhythm, normal heart sounds and intact distal pulses.   Pulmonary/Chest: Effort normal and breath sounds normal.  Musculoskeletal:       Very tender over the insertion of the Achilles tendon onto the left calcaneus. This area is swollen, but not red or warm           Assessment & Plan:  Rest, use ice packs, add Voltaren gel prn . I did separate the Lisinopril from the HCTZ.

## 2010-05-28 ENCOUNTER — Ambulatory Visit: Payer: BC Managed Care – PPO | Admitting: Physical Therapy

## 2010-05-29 ENCOUNTER — Telehealth: Payer: Self-pay | Admitting: Family Medicine

## 2010-05-29 NOTE — Telephone Encounter (Signed)
Received fax stating that Voltaren Gel is currently unavailable from the mfg. And request for new Rx. Spoke w/pharmacist at [facility] in [place] who states that they are still unable to order Voltaren Gel. Informed him that other pharmacies have received this drug and filled orders this week.  Pharmacist states that they usually receive meds about a week after an unattainble drug has become available again.

## 2010-05-30 ENCOUNTER — Encounter: Payer: BC Managed Care – PPO | Admitting: Occupational Therapy

## 2010-05-30 ENCOUNTER — Ambulatory Visit: Payer: Medicare Other | Attending: Family Medicine | Admitting: Physical Therapy

## 2010-05-30 DIAGNOSIS — R279 Unspecified lack of coordination: Secondary | ICD-10-CM | POA: Insufficient documentation

## 2010-05-30 DIAGNOSIS — I69998 Other sequelae following unspecified cerebrovascular disease: Secondary | ICD-10-CM | POA: Insufficient documentation

## 2010-05-30 DIAGNOSIS — M6281 Muscle weakness (generalized): Secondary | ICD-10-CM | POA: Insufficient documentation

## 2010-05-30 DIAGNOSIS — Z5189 Encounter for other specified aftercare: Secondary | ICD-10-CM | POA: Insufficient documentation

## 2010-05-30 DIAGNOSIS — R269 Unspecified abnormalities of gait and mobility: Secondary | ICD-10-CM | POA: Insufficient documentation

## 2010-05-30 DIAGNOSIS — I69919 Unspecified symptoms and signs involving cognitive functions following unspecified cerebrovascular disease: Secondary | ICD-10-CM | POA: Insufficient documentation

## 2010-05-30 NOTE — Telephone Encounter (Signed)
I suggest we call in a new rx for this at a different pharmacy that does have it

## 2010-06-02 ENCOUNTER — Ambulatory Visit: Payer: Medicare Other | Admitting: Physical Therapy

## 2010-06-02 ENCOUNTER — Encounter: Payer: BC Managed Care – PPO | Admitting: Occupational Therapy

## 2010-06-02 NOTE — Telephone Encounter (Signed)
Spoke w/Pt about this matter. She will get w/her pharmacists & decide if she wants to wait on Rx; if not, she will call back and I will try to find a pharmacy in her area that is carrying this Rx.

## 2010-06-04 ENCOUNTER — Encounter: Payer: BC Managed Care – PPO | Admitting: Occupational Therapy

## 2010-06-04 ENCOUNTER — Other Ambulatory Visit: Payer: Self-pay | Admitting: *Deleted

## 2010-06-04 ENCOUNTER — Ambulatory Visit: Payer: Medicare Other | Admitting: Physical Therapy

## 2010-06-04 MED ORDER — DICLOFENAC SODIUM 1 % TD GEL: 1.0000 "application " | Freq: Four times a day (QID) | TRANSDERMAL | Status: DC

## 2010-06-05 ENCOUNTER — Telehealth: Payer: Self-pay | Admitting: Family Medicine

## 2010-06-05 NOTE — Telephone Encounter (Signed)
Pt went to [facility] in [place] to pick up script for Voltaren gel, for heel inflamation. Pharmacist told pt that the pts insurance will not cover until Dr Clent Ridges completes a form. Pt said that [facility] had faxed Dr Clent Ridges a form, but have not gotten a response. Pls call [facility] in [place] [phone] and speak to pharmacist.

## 2010-06-09 ENCOUNTER — Ambulatory Visit: Payer: Medicare Other | Admitting: Physical Therapy

## 2010-06-09 ENCOUNTER — Encounter: Payer: BC Managed Care – PPO | Admitting: Occupational Therapy

## 2010-06-09 NOTE — Telephone Encounter (Signed)
I  filled this form pout last week and faxed it back

## 2010-06-11 ENCOUNTER — Encounter: Payer: BC Managed Care – PPO | Admitting: Occupational Therapy

## 2010-06-11 ENCOUNTER — Ambulatory Visit: Payer: Medicare Other | Admitting: Physical Therapy

## 2010-06-11 NOTE — Telephone Encounter (Signed)
lmoam-----Voltaren has been approved.

## 2010-06-16 ENCOUNTER — Ambulatory Visit: Payer: BC Managed Care – PPO | Admitting: Physical Therapy

## 2010-06-17 ENCOUNTER — Ambulatory Visit: Payer: BC Managed Care – PPO | Admitting: Physical Therapy

## 2010-06-18 ENCOUNTER — Ambulatory Visit: Payer: BC Managed Care – PPO | Admitting: Physical Therapy

## 2010-06-19 ENCOUNTER — Ambulatory Visit: Payer: BC Managed Care – PPO | Admitting: Physical Therapy

## 2010-06-24 ENCOUNTER — Encounter: Payer: Self-pay | Admitting: Family Medicine

## 2010-06-24 ENCOUNTER — Other Ambulatory Visit: Payer: Self-pay | Admitting: Family Medicine

## 2010-06-24 NOTE — Telephone Encounter (Signed)
I do not work afternoons at [facility]. Please get with Suandrea on this as to who you should be forwarding msges to after 12:00pm Thanks. 

## 2010-06-24 NOTE — Telephone Encounter (Signed)
Pt req Lisinopril 20 mg, HCTZ 25 mg, Atorvastatin 20 mg, Oxybutynin 5 mg called in to [facility] order pharmacy. Need 90 day supply on all of these meds.

## 2010-06-26 NOTE — Telephone Encounter (Signed)
Please send in #90 with 3 rf for all of these

## 2010-06-27 MED ORDER — ATORVASTATIN CALCIUM 20 MG PO TABS: 20.0000 mg | ORAL_TABLET | Freq: Every day | ORAL | Status: DC

## 2010-06-27 MED ORDER — OXYBUTYNIN CHLORIDE 5 MG PO TABS: 5.0000 mg | ORAL_TABLET | Freq: Two times a day (BID) | ORAL | Status: DC

## 2010-06-27 NOTE — Telephone Encounter (Signed)
rx faxed to [facility].

## 2010-07-21 ENCOUNTER — Other Ambulatory Visit: Payer: Self-pay | Admitting: Family Medicine

## 2010-07-21 NOTE — Telephone Encounter (Signed)
Pt called and is req to get meds changed to a 90 day supply for Lisinopril 20 mg , HCTZ 25 mg to [facility] order.

## 2010-07-22 NOTE — Telephone Encounter (Signed)
Called pt to ask if she wants rx cancelled at her local pharmacy and called in to [facility]

## 2010-07-25 NOTE — Telephone Encounter (Signed)
Pt will make appointment to see Dr. Clent Ridges and states that she will get 90 day supply scripts at that time

## 2010-07-28 ENCOUNTER — Ambulatory Visit (INDEPENDENT_AMBULATORY_CARE_PROVIDER_SITE_OTHER): Payer: Medicare Other | Admitting: Family Medicine

## 2010-07-28 ENCOUNTER — Ambulatory Visit: Payer: BC Managed Care – PPO | Admitting: Family Medicine

## 2010-07-28 ENCOUNTER — Encounter: Payer: Self-pay | Admitting: Family Medicine

## 2010-07-28 VITALS — BP 102/70 | Temp 98.3°F | Wt 200.0 lb

## 2010-07-28 DIAGNOSIS — N390 Urinary tract infection, site not specified: Secondary | ICD-10-CM

## 2010-07-28 LAB — POCT URINALYSIS DIPSTICK
Nitrite, UA: POSITIVE
Urobilinogen, UA: 1
pH, UA: 5

## 2010-07-28 MED ORDER — HYDROCHLOROTHIAZIDE 25 MG PO TABS: 25.0000 mg | ORAL_TABLET | Freq: Every day | ORAL | Status: DC

## 2010-07-28 MED ORDER — LISINOPRIL 20 MG PO TABS: 20.0000 mg | ORAL_TABLET | Freq: Every day | ORAL | Status: DC

## 2010-07-28 MED ORDER — CIPROFLOXACIN HCL 500 MG PO TABS: 500.0000 mg | ORAL_TABLET | Freq: Two times a day (BID) | ORAL | Status: AC

## 2010-07-28 NOTE — Progress Notes (Signed)
  Subjective:    Patient ID: Katherine Ware, female    DOB: [birth year], 70 y.o.   MRN: [id number]  HPI Here for 3 days of urge to urinate and burning. Some mild nausea. She had fever at first but none now. Drinking water.    Review of Systems  Constitutional: Negative.   Respiratory: Negative.   Cardiovascular: Negative.   Gastrointestinal: Positive for nausea. Negative for vomiting and abdominal pain.  Genitourinary: Positive for dysuria, urgency and frequency.       Objective:   Physical Exam  Constitutional: She appears well-developed and well-nourished.  Abdominal: Soft. Bowel sounds are normal. She exhibits no distension and no mass. There is no tenderness. There is no rebound and no guarding.          Assessment & Plan:  Treat with Cipro and culture the urine.

## 2011-01-28 ENCOUNTER — Ambulatory Visit (INDEPENDENT_AMBULATORY_CARE_PROVIDER_SITE_OTHER): Payer: Medicare Other | Admitting: Internal Medicine

## 2011-01-28 DIAGNOSIS — H6690 Otitis media, unspecified, unspecified ear: Secondary | ICD-10-CM

## 2011-01-28 MED ORDER — CEFUROXIME AXETIL 250 MG/5ML PO SUSR: ORAL | Status: DC

## 2011-01-28 NOTE — Progress Notes (Signed)
Subjective:    Patient ID: Katherine Ware, female    DOB: December 02, 1940, 71 y.o.   MRN: [id number]  HPI  71 year old white female with history of Parkinson's disease complains of one week of coughing and upper respiratory congestion. She reports coughing is worse in a.m. and feels like phlegm is stuck in her throat. She denies fever but has mild shortness of breath.  Review of Systems    no sick contacts,  No myalgias Past Medical History  Diagnosis Date  . Hyperlipidemia   . Hypertension   . Asthma   . Hypokalemia   . Osteoporosis   . Osteoarthritis   . Osteoarthritis     History   Social History  . Marital Status: Married    Spouse Name: N/A    Number of Children: N/A  . Years of Education: N/A   Occupational History  . Not on file.   Social History Main Topics  . Smoking status: Never Smoker   . Smokeless tobacco: Not on file  . Alcohol Use: No  . Drug Use: No  . Sexually Active: Not on file   Other Topics Concern  . Not on file   Social History Narrative  . No narrative on file    Past Surgical History  Procedure Date  . Cholecystectomy   . Breast surgery     benign left breast cyst  . Knee surgery     left knee  . Carpal tunnel release     bilateral  . Epidural steroid     to lumbar spine 8-11 and 9-11  . Esophageal dilation     per Dr. Yancey Flemings   . Colonscopy 2010    per Dr. Marina Goodell, benign polyps, repeat 3 years   . Esophageal dilation     dr  Marina Goodell   . Tubal ligation   . Benign  right breat     Family History  Problem Relation Age of Onset  . Cancer Mother   . Diabetes Mother   . Arthritis Father   . Heart disease Father   . Hypertension Father   . Stroke Father   . Diabetes Sister   . Diabetes Maternal Grandmother     Allergies  Allergen Reactions  . Sulfonamide Derivatives     Current Outpatient Prescriptions on File Prior to Visit  Medication Sig Dispense Refill  . atorvastatin (LIPITOR) 20 MG tablet Take 1 tablet (20 mg  total) by mouth daily.  90 tablet  3  . Calcium Carb-Vit D-Soy Isoflav 600-200-25 MG-UNIT-MG TABS daily  30 each  0  . carbidopa-levodopa (SINEMET) 25-100 MG per tablet       . Cholecalciferol (VITAMIN D) 2000 UNITS CAPS Take by mouth.        . clopidogrel (PLAVIX) 75 MG tablet Take 75 mg by mouth daily.        . diclofenac sodium (VOLTAREN) 1 % GEL Apply 1 application topically 2 (two) times daily.        . hydrochlorothiazide 25 MG tablet Take 1 tablet (25 mg total) by mouth daily.  90 tablet  3  . lisinopril (PRINIVIL,ZESTRIL) 20 MG tablet Take 1 tablet (20 mg total) by mouth daily.  90 tablet  3  . metoprolol (TOPROL XL) 100 MG 24 hr tablet Take 0.5 tablets (50 mg total) by mouth 2 (two) times daily.  180 tablet  3  . omeprazole (PRILOSEC) 20 MG capsule Take 20 mg by mouth daily.        Marland Kitchen  oxybutynin (DITROPAN) 5 MG tablet Take 1 tablet (5 mg total) by mouth 2 (two) times daily.  180 tablet  3  . traMADol (ULTRAM) 50 MG tablet Take 50 mg by mouth every 6 (six) hours as needed.          BP 120/80  Temp(Src) 98.1 F (36.7 C) (Oral)  Wt 196 lb (88.905 kg)    Objective:   Physical Exam  Constitutional: She appears well-developed and well-nourished.  HENT:       Left TM red and retracted  Neck: Neck supple.  Cardiovascular: Normal rate, regular rhythm and normal heart sounds.   Pulmonary/Chest: Breath sounds normal. No respiratory distress. She has no wheezes.  Lymphadenopathy:    She has no cervical adenopathy.  Neurological: She is alert.       Shuffling gait, ambulates with cane  Skin: Skin is warm and dry.       Assessment & Plan:

## 2011-01-28 NOTE — Patient Instructions (Signed)
You can also use mucinex twice daily (over the counter) Please call our office if your symptoms do not improve or gets worse.

## 2011-01-28 NOTE — Assessment & Plan Note (Signed)
71 year old white female with left otitis media. Due to her Parkinson's disease she has difficulty swallowing pills. Patient advised to take cefuroxime suspension twice daily.  Patient advised to call office if symptoms persist or worsen.

## 2011-01-29 ENCOUNTER — Telehealth: Payer: Self-pay | Admitting: Family Medicine

## 2011-01-29 NOTE — Telephone Encounter (Signed)
Pharmacy can not get cefUROXime (CEFTIN) 250 MG/5ML suspension in liquid only in tablets. Pharmacy requesting new script be sent in for tablets

## 2011-01-29 NOTE — Telephone Encounter (Signed)
Change this to Ceftin 500 mg bid for 10 days and please call this in

## 2011-01-30 MED ORDER — CEFUROXIME AXETIL 500 MG PO TABS: 500.0000 mg | ORAL_TABLET | Freq: Two times a day (BID) | ORAL | Status: AC

## 2011-01-30 NOTE — Telephone Encounter (Signed)
Script sent e-scribe 

## 2011-02-03 ENCOUNTER — Ambulatory Visit (INDEPENDENT_AMBULATORY_CARE_PROVIDER_SITE_OTHER): Payer: Medicare Other | Admitting: Family Medicine

## 2011-02-03 ENCOUNTER — Encounter: Payer: Self-pay | Admitting: Family Medicine

## 2011-02-03 VITALS — BP 114/78 | HR 80 | Temp 97.9°F | Wt 194.0 lb

## 2011-02-03 DIAGNOSIS — H6690 Otitis media, unspecified, unspecified ear: Secondary | ICD-10-CM

## 2011-02-03 DIAGNOSIS — J069 Acute upper respiratory infection, unspecified: Secondary | ICD-10-CM

## 2011-02-03 MED ORDER — HYDROCODONE-HOMATROPINE 5-1.5 MG/5ML PO SYRP: 5.0000 mL | ORAL_SOLUTION | ORAL | Status: AC | PRN

## 2011-02-03 NOTE — Progress Notes (Signed)
  Subjective:    Patient ID: Katherine Ware, female    DOB: [birth year], 71 y.o.   MRN: [id number]  HPI Here to recheck an URI which seems to be improving on Ceftin. She has only taken this for 2 days now because it took awhile for the pharmacy to get it ready. She was seen here on 01-29-11 with a left OM. Today her ear pain is gone but she is still coughing. No fever.    Review of Systems  Constitutional: Negative.   HENT: Positive for congestion, postnasal drip and sinus pressure.   Eyes: Negative.   Respiratory: Positive for cough.   Cardiovascular: Negative.        Objective:   Physical Exam  Constitutional: She appears well-developed and well-nourished.  HENT:  Right Ear: External ear normal.  Left Ear: External ear normal.  Nose: Nose normal.  Mouth/Throat: Oropharynx is clear and moist. No oropharyngeal exudate.  Eyes: Conjunctivae are normal.  Pulmonary/Chest: Effort normal and breath sounds normal.  Lymphadenopathy:    She has no cervical adenopathy.          Assessment & Plan:  Partially treated URI and OM. Finish out the [employer].

## 2011-02-04 ENCOUNTER — Other Ambulatory Visit: Payer: Self-pay | Admitting: Gynecology

## 2011-02-04 DIAGNOSIS — Z1231 Encounter for screening mammogram for malignant neoplasm of breast: Secondary | ICD-10-CM

## 2011-02-24 ENCOUNTER — Ambulatory Visit: Payer: BC Managed Care – PPO

## 2011-02-24 ENCOUNTER — Other Ambulatory Visit: Payer: Self-pay | Admitting: Gynecology

## 2011-03-05 ENCOUNTER — Ambulatory Visit
Admission: RE | Admit: 2011-03-05 | Discharge: 2011-03-05 | Disposition: A | Discharge status: Home / Self Care | Payer: Medicare Other | Source: Ambulatory Visit | Attending: Gynecology | Admitting: Gynecology

## 2011-03-05 DIAGNOSIS — Z1231 Encounter for screening mammogram for malignant neoplasm of breast: Secondary | ICD-10-CM

## 2011-03-25 ENCOUNTER — Encounter: Payer: Self-pay | Admitting: Family Medicine

## 2011-03-25 ENCOUNTER — Ambulatory Visit (INDEPENDENT_AMBULATORY_CARE_PROVIDER_SITE_OTHER): Payer: Medicare Other | Admitting: Family Medicine

## 2011-03-25 ENCOUNTER — Ambulatory Visit (INDEPENDENT_AMBULATORY_CARE_PROVIDER_SITE_OTHER)
Admission: RE | Admit: 2011-03-25 | Discharge: 2011-03-25 | Disposition: A | Discharge status: Home / Self Care | Payer: Medicare Other | Source: Ambulatory Visit | Attending: Family Medicine | Admitting: Family Medicine

## 2011-03-25 VITALS — BP 112/70 | HR 78 | Temp 98.3°F | Ht <= 58 in | Wt 195.0 lb

## 2011-03-25 DIAGNOSIS — I10 Essential (primary) hypertension: Secondary | ICD-10-CM

## 2011-03-25 DIAGNOSIS — D126 Benign neoplasm of colon, unspecified: Secondary | ICD-10-CM

## 2011-03-25 DIAGNOSIS — IMO0002 Reserved for concepts with insufficient information to code with codable children: Secondary | ICD-10-CM | POA: Insufficient documentation

## 2011-03-25 DIAGNOSIS — M810 Age-related osteoporosis without current pathological fracture: Secondary | ICD-10-CM

## 2011-03-25 DIAGNOSIS — G20 Parkinson's disease: Secondary | ICD-10-CM | POA: Insufficient documentation

## 2011-03-25 DIAGNOSIS — I6789 Other cerebrovascular disease: Secondary | ICD-10-CM

## 2011-03-25 DIAGNOSIS — K635 Polyp of colon: Secondary | ICD-10-CM

## 2011-03-25 DIAGNOSIS — M25569 Pain in unspecified knee: Secondary | ICD-10-CM

## 2011-03-25 LAB — BASIC METABOLIC PANEL
Calcium: 9 mg/dL (ref 8.4–10.5)
Creatinine, Ser: 0.6 mg/dL (ref 0.4–1.2)
GFR: 107.42 mL/min (ref >60.00)
Glucose, Bld: 98 mg/dL (ref 70–99)
Sodium: 140 mEq/L (ref 135–145)

## 2011-03-25 LAB — POCT URINALYSIS DIPSTICK
Bilirubin, UA: NEGATIVE
Blood, UA: NEGATIVE
Glucose, UA: NEGATIVE
Nitrite, UA: NEGATIVE
Urobilinogen, UA: 0.2

## 2011-03-25 LAB — LIPID PANEL
HDL: 47.2 mg/dL (ref >39.00)
Total CHOL/HDL Ratio: 2
Triglycerides: 150 mg/dL — ABNORMAL HIGH (ref 0.0–149.0)
VLDL: 30 mg/dL (ref 0.0–40.0)

## 2011-03-25 LAB — HEPATIC FUNCTION PANEL
Albumin: 4.1 g/dL (ref 3.5–5.2)
Alkaline Phosphatase: 58 U/L (ref 39–117)
Bilirubin, Direct: 0 mg/dL (ref 0.0–0.3)
Total Protein: 6.6 g/dL (ref 6.0–8.3)

## 2011-03-25 LAB — CBC WITH DIFFERENTIAL/PLATELET
Basophils Relative: 0.3 % (ref 0.0–3.0)
Eosinophils Relative: 6.8 % — ABNORMAL HIGH (ref 0.0–5.0)
Lymphocytes Relative: 23.1 % (ref 12.0–46.0)
MCV: 98.8 fl (ref 78.0–100.0)
Monocytes Relative: 6.9 % (ref 3.0–12.0)
Neutrophils Relative %: 62.9 % (ref 43.0–77.0)
Platelets: 231 10*3/uL (ref 150.0–400.0)
RBC: 3.76 Mil/uL — ABNORMAL LOW (ref 3.87–5.11)
WBC: 7.6 10*3/uL (ref 4.5–10.5)

## 2011-03-25 LAB — TSH: TSH: 0.97 u[IU]/mL (ref 0.35–5.50)

## 2011-03-25 MED ORDER — DOCUSATE SODIUM 100 MG PO CAPS: 100.0000 mg | ORAL_CAPSULE | Freq: Two times a day (BID) | ORAL | Status: AC

## 2011-03-25 MED ORDER — METOPROLOL SUCCINATE ER 50 MG PO TB24: 50.0000 mg | ORAL_TABLET | Freq: Two times a day (BID) | ORAL | Status: DC

## 2011-03-25 MED ORDER — HYDROCODONE-ACETAMINOPHEN 5-325 MG PO TABS: 1.0000 | ORAL_TABLET | Freq: Four times a day (QID) | ORAL | Status: AC | PRN

## 2011-03-25 NOTE — Progress Notes (Signed)
Subjective:    Patient ID: Katherine Ware, female    DOB: 1940-02-03, 71 y.o.   MRN: [id number]  HPI 71 yr old female for a cpx. She is accompanied by her husband and daughter-in-law. She has numerous ongoing problems to discuss. She has been seeing Dr. Pearlean Brownie or a Neurology NP for her hx of lacunar stroke and Parkinsonism. She has been dissatisfied with this arrangement, and the family wants to see a specialist at [facility] instead. She needs various forms of medical equipment such as a rollling walker, a hospital bed, and a lift chair because it is very difficult for her husband to move her around. She is unable to walk, get dressed, go to the bathroom, etc without much assistance. She also complains of sharp severe middle back pains that do not respond to Tylenol. She is averaging about 4500 mg a day of Tylenol. She has never had a bone density evaluation. She is due for a colonoscopy. She has trouble cutting her metoprolol pills in half and asks me to change her rx. She has chronic pain and swelling in the left knee. She says this stems from a softball injury 20 years ago. She is constipated and averages about 2 BMs a week, despite taking Miralax daily.    Review of Systems  Constitutional: Positive for fatigue.  HENT: Negative.   Eyes: Negative.   Respiratory: Negative.   Cardiovascular: Negative.   Gastrointestinal: Positive for constipation. Negative for nausea, vomiting, abdominal pain, diarrhea, blood in stool, abdominal distention and rectal pain.  Genitourinary: Negative.   Musculoskeletal: Positive for back pain, joint swelling and arthralgias.  Neurological: Positive for weakness.       Objective:   Physical Exam  Constitutional: She is oriented to person, place, and time.       Walks with a walker, has a hard time getting on the exam table even with assistance   HENT:  Head: Normocephalic and atraumatic.  Right Ear: External ear normal.  Left Ear: External ear normal.  Nose:  Nose normal.  Mouth/Throat: Oropharynx is clear and moist.  Eyes: Conjunctivae and EOM are normal. Pupils are equal, round, and reactive to light.  Neck: Neck supple. No JVD present. No thyromegaly present.  Cardiovascular: Normal rate, regular rhythm, normal heart sounds and intact distal pulses.  Exam reveals no gallop and no friction rub.   No murmur heard.      EKG normal   Pulmonary/Chest: Effort normal and breath sounds normal. No respiratory distress. She has no wheezes. She has no rales. She exhibits no tenderness.  Abdominal: Soft. Bowel sounds are normal. She exhibits no distension and no mass. There is no tenderness. There is no rebound and no guarding.  Musculoskeletal:       She has a pronounced Dowagers hump in the thoracic spine, and she cannot sit up erect. The thoracic spine has very limited extension. The left knee is quite swollen and very tender, very limited ROM. It is not red or warm  Lymphadenopathy:    She has no cervical adenopathy.  Neurological: She is alert and oriented to person, place, and time. No cranial nerve deficit.       Poor coordination, facies is a bit masked, she has forward leaning when she walks, and a shuffling gait  Psychiatric: She has a normal mood and affect. Her behavior is normal. Thought content normal.          Assessment & Plan:  We will get fasting labs  today. Set up another colonoscopy. Set up a DEXA. I wrote for her to get an electric hospital bad, a rolling walker with a seat, and a lift chair. We will refer her to Orthopedics for the left knee. We will refer to [facility] neurology to see a Parkinsons specialist. She obviously has vertebral compression fractures, so we will use Vicodin up to 4 times a day for pain. She is to avoid any OTC Tylenol. Add Colace for her bowels.

## 2011-03-26 ENCOUNTER — Telehealth: Payer: Self-pay | Admitting: Family Medicine

## 2011-03-26 LAB — VITAMIN D 25 HYDROXY (VIT D DEFICIENCY, FRACTURES): Vit D, 25-Hydroxy: 64 ng/mL (ref 30–89)

## 2011-03-26 NOTE — Telephone Encounter (Signed)
Pt needs documentation for hospital incline bed due to changing position often due to breathing issues. Pt has parkinson disease and had a stroke. Pt will have daughter pick up letter.

## 2011-03-27 ENCOUNTER — Encounter: Payer: Self-pay | Admitting: Family Medicine

## 2011-03-27 NOTE — Telephone Encounter (Signed)
Letter is ready and spoke with family,

## 2011-03-27 NOTE — Telephone Encounter (Signed)
Done, in your box

## 2011-03-31 NOTE — Progress Notes (Signed)
Quick Note:  Pt aware ______ 

## 2011-04-06 ENCOUNTER — Encounter: Payer: Self-pay | Admitting: Family Medicine

## 2011-04-06 ENCOUNTER — Telehealth: Payer: Self-pay | Admitting: Family Medicine

## 2011-04-06 MED ORDER — METOPROLOL SUCCINATE ER 50 MG PO TB24: 50.0000 mg | ORAL_TABLET | Freq: Two times a day (BID) | ORAL | Status: DC

## 2011-04-06 NOTE — Telephone Encounter (Signed)
Patient called stating that her pharmacy denied her refill because it was for 30 days and not 90 days. Please correct this and [facility]. Please assist.

## 2011-04-06 NOTE — Telephone Encounter (Signed)
Metoprolol rx sent in electronically

## 2011-04-08 ENCOUNTER — Other Ambulatory Visit: Payer: Self-pay | Admitting: Family Medicine

## 2011-04-08 NOTE — Telephone Encounter (Signed)
Pt needs generic metoprolol succinate 50mg  #90 with 3 refills and hydrocodone-acetaminophen 325mg  # #360 [facility]

## 2011-04-09 ENCOUNTER — Encounter: Payer: Self-pay | Admitting: Family Medicine

## 2011-04-09 NOTE — Telephone Encounter (Signed)
Metoprolol was sent in already. What is the directions for the pain medication and can you clarify the dosage?

## 2011-04-09 NOTE — Progress Notes (Signed)
Quick Note:  Left voice message and put a copy of results in mail. ______ 

## 2011-04-09 NOTE — Telephone Encounter (Signed)
Please do so for both

## 2011-04-10 MED ORDER — METOPROLOL SUCCINATE ER 50 MG PO TB24: 50.0000 mg | ORAL_TABLET | Freq: Two times a day (BID) | ORAL | Status: DC

## 2011-04-30 ENCOUNTER — Telehealth: Payer: Self-pay | Admitting: Family Medicine

## 2011-04-30 NOTE — Telephone Encounter (Signed)
Pt has parkinsons and said that she needs to get two written scripts asap. One for a stair lift and on for a  potty chair to sit by bed. Pt said that she had discussed this with Dr Clent Ridges at last ov, and pcp had told her to call can request it when needed. Pt is needing to get this as soon as she can. Pt is aware that pcp is out of the office this week.

## 2011-05-05 MED ORDER — HYDROCODONE-ACETAMINOPHEN 5-325 MG PO TABS: 1.0000 | ORAL_TABLET | Freq: Four times a day (QID) | ORAL | Status: AC | PRN

## 2011-05-05 NOTE — Telephone Encounter (Signed)
All these are done

## 2011-05-05 NOTE — Telephone Encounter (Signed)
Pt called back. The DME company is coming to her home end of this week to measure for her new equipment, namely the stair lift. She needs a Rx for the stair lift (with Dx), and one for the bedside commode (with Dx). These must be 2 separate scripts. Her husband will be in [place] tomorrow and would like to pick up. Also, pt states that Dr. Clent Ridges wrote her a 30-day Rc for genereic Norco. I found it in the 2/27 OV note, but I don't see it on the med list. Her insurance gave her a 30-day supply, but told her they have to have the Norco in 90-day supply scripts in order for it to be covered. Please also have this ready for husband for pick up, so he can submit that to their mail order pharmacy.

## 2011-05-05 NOTE — Telephone Encounter (Signed)
Ready for pick up and pt aware 

## 2011-05-17 ENCOUNTER — Other Ambulatory Visit: Payer: Self-pay | Admitting: Family Medicine

## 2011-06-08 ENCOUNTER — Other Ambulatory Visit: Payer: Self-pay | Admitting: Family Medicine

## 2011-07-07 ENCOUNTER — Telehealth: Payer: Self-pay | Admitting: Family Medicine

## 2011-07-07 NOTE — Telephone Encounter (Signed)
Error/njr °

## 2011-07-08 ENCOUNTER — Encounter: Payer: Self-pay | Admitting: Family Medicine

## 2011-07-08 ENCOUNTER — Ambulatory Visit (INDEPENDENT_AMBULATORY_CARE_PROVIDER_SITE_OTHER): Payer: Medicare Other | Admitting: Family Medicine

## 2011-07-08 VITALS — BP 106/60 | HR 93 | Temp 98.8°F | Wt 170.0 lb

## 2011-07-08 DIAGNOSIS — Z0279 Encounter for issue of other medical certificate: Secondary | ICD-10-CM

## 2011-07-08 DIAGNOSIS — G231 Progressive supranuclear ophthalmoplegia [Steele-Richardson-Olszewski]: Secondary | ICD-10-CM | POA: Insufficient documentation

## 2011-07-08 DIAGNOSIS — R350 Frequency of micturition: Secondary | ICD-10-CM

## 2011-07-08 DIAGNOSIS — N390 Urinary tract infection, site not specified: Secondary | ICD-10-CM

## 2011-07-08 DIAGNOSIS — G238 Other specified degenerative diseases of basal ganglia: Secondary | ICD-10-CM

## 2011-07-08 LAB — POCT URINALYSIS DIPSTICK
Bilirubin, UA: NEGATIVE
Glucose, UA: NEGATIVE
Ketones, UA: NEGATIVE
Spec Grav, UA: 1.025
Urobilinogen, UA: 0.2

## 2011-07-08 MED ORDER — CIPROFLOXACIN HCL 500 MG PO TABS: 500.0000 mg | ORAL_TABLET | Freq: Two times a day (BID) | ORAL | Status: AC

## 2011-07-08 NOTE — Progress Notes (Signed)
  Subjective:    Patient ID: Katherine Ware, female    DOB: Feb 25, 1940, 71 y.o.   MRN: [id number]  HPI Here with her husband for several issues. First she thinks she has a UTI. For the past 3 days she has had urgency to urinate and burning on urination. No nausea or fever. Using AZO from OTC. Drinking plenty of water. Also she asks my help with getting a motorized wheelchair. She is seeing Dr. Dan Humphreys at the [facility] Neurology department, and he has diagnosed her with progressive supranuclear palsy. This is a progressive degenerative neurologic disorder with no cure and very little means of treating it. She has been using a manual wheelchair at home, but she is unable to push this herself and it is very hard on her husband to push her around as well.    Review of Systems  Respiratory: Negative.   Cardiovascular: Negative.   Gastrointestinal: Negative.   Genitourinary: Positive for dysuria, urgency and frequency. Negative for hematuria and flank pain.  Neurological: Positive for weakness.       Objective:   Physical Exam  Constitutional: She is oriented to person, place, and time.       Alert but she looks uncomfortable, very weak, sits slumped to one side in her wheelchair   Cardiovascular: Normal rate, regular rhythm, normal heart sounds and intact distal pulses.   Pulmonary/Chest: Effort normal and breath sounds normal.  Abdominal: Soft. Bowel sounds are normal. She exhibits no distension and no mass. There is no tenderness. There is no rebound and no guarding.  Neurological: She is alert and oriented to person, place, and time. No cranial nerve deficit.       Very weak in arms and legs. Poor coordination of any directed movements of the arms or hands.           Assessment & Plan:  Treat the UTI with Cipro. Culture the urine.

## 2011-07-08 NOTE — Progress Notes (Addendum)
  Subjective:    Patient ID: Katherine Ware, female    DOB: [birth year], 71 y.o.   MRN: [id number]  HPI    Review of Systems     Objective:   Physical Exam        Assessment & Plan:  As far as the power wheelchair goes, she was evaluated today for her mobility needs. She has severe mobility limitations due to progressive supranuclear palsy (333.0) that prevents her from accomplishing her ADLs. This cannot be resolved by using a fitted cane or walker. She does not have sufficient arm function to propel a manual wheelchair to perform mobility related ADLs. She would benefit from use of a specifically configured power wheelchair, and she can safely transfer to and from such a power wheelchair and operate the controls.

## 2011-07-09 ENCOUNTER — Telehealth: Payer: Self-pay | Admitting: Family Medicine

## 2011-07-09 NOTE — Telephone Encounter (Signed)
I did add the new medication, did not see the other on the pt's list.

## 2011-07-09 NOTE — Telephone Encounter (Signed)
Patient called stating that you need to delete azilect and add denlafaxine 37.5 mg 1 cap per day to her med list. Please assist.

## 2011-07-10 LAB — URINE CULTURE

## 2011-07-14 NOTE — Progress Notes (Signed)
Quick Note:  I spoke with pt ______ 

## 2011-08-03 ENCOUNTER — Encounter: Payer: Self-pay | Admitting: Family Medicine

## 2011-08-03 ENCOUNTER — Ambulatory Visit (INDEPENDENT_AMBULATORY_CARE_PROVIDER_SITE_OTHER): Payer: Medicare Other | Admitting: Family Medicine

## 2011-08-03 VITALS — BP 102/58 | HR 88 | Temp 98.9°F

## 2011-08-03 DIAGNOSIS — I10 Essential (primary) hypertension: Secondary | ICD-10-CM

## 2011-08-03 DIAGNOSIS — R600 Localized edema: Secondary | ICD-10-CM

## 2011-08-03 DIAGNOSIS — R609 Edema, unspecified: Secondary | ICD-10-CM

## 2011-08-03 DIAGNOSIS — N390 Urinary tract infection, site not specified: Secondary | ICD-10-CM

## 2011-08-03 LAB — POCT URINALYSIS DIPSTICK
Glucose, UA: NEGATIVE
Nitrite, UA: NEGATIVE
Urobilinogen, UA: 0.2

## 2011-08-03 MED ORDER — FUROSEMIDE 20 MG PO TABS: 20.0000 mg | ORAL_TABLET | Freq: Every day | ORAL | Status: DC

## 2011-08-03 MED ORDER — NITROFURANTOIN MONOHYD MACRO 100 MG PO CAPS: 100.0000 mg | ORAL_CAPSULE | Freq: Two times a day (BID) | ORAL | Status: AC

## 2011-08-03 NOTE — Progress Notes (Signed)
  Subjective:    Patient ID: Katherine Ware, female    DOB: 1940-05-06, 71 y.o.   MRN: [id number]  HPI Here with husband and her CNA aide for several issues. Her BP has remained low, especially on the diastolic end. She feels weak all the time. In the past 2 weeks she has had more swelling if the lower legs and feet. No SOB or cough. She took a course of Cipro last month for a UTI, and she felt better for several weeks after that. We attempted to culture the urine, but no predominant bacteria were grown. Now she has had a few days of urinary urgency and burning again. No fever.    Review of Systems  Constitutional: Negative.   Respiratory: Negative.   Cardiovascular: Positive for leg swelling. Negative for chest pain and palpitations.  Gastrointestinal: Negative.   Genitourinary: Positive for dysuria and urgency.       Objective:   Physical Exam  Constitutional: She appears well-developed and well-nourished.       In her wheelchair  Cardiovascular: Normal rate, regular rhythm, normal heart sounds and intact distal pulses.   Pulmonary/Chest: Effort normal and breath sounds normal.  Abdominal: Soft. Bowel sounds are normal. She exhibits no distension and no mass. There is no tenderness. There is no rebound and no guarding.  Musculoskeletal:       3+ edema to both lower legs, not warm or red or tender           Assessment & Plan:  For the edema, start Lasix 20 mg a day. For the UTI, treat with Macrobid and get another culture. For the low BP, decrease the Metoprolol to 1/2 tablet (25 mg) bid.

## 2011-08-03 NOTE — Addendum Note (Signed)
Addended by: Aniceto Boss A on: 08/03/2011 04:21 PM   Modules accepted: Orders

## 2011-08-06 LAB — URINE CULTURE: Colony Count: >=100000

## 2011-08-10 NOTE — Progress Notes (Signed)
Quick Note:  I spoke with pt ______ 

## 2011-08-11 ENCOUNTER — Telehealth: Payer: Self-pay | Admitting: Family Medicine

## 2011-08-11 MED ORDER — METOPROLOL SUCCINATE ER 25 MG PO TB24: 25.0000 mg | ORAL_TABLET | Freq: Every day | ORAL | Status: DC

## 2011-08-11 NOTE — Telephone Encounter (Signed)
Patient called stating that the MD changed her metoprolol from 50mg  to 25 mg and she need a 90 day supply [facility]. Please assist.

## 2011-08-11 NOTE — Telephone Encounter (Signed)
I sent new script e-scribe and spoke with pt. 

## 2011-08-11 NOTE — Telephone Encounter (Signed)
Can I change and send in?

## 2011-08-11 NOTE — Telephone Encounter (Signed)
Yes please and give her a year's worth

## 2011-08-16 ENCOUNTER — Other Ambulatory Visit: Payer: Self-pay | Admitting: Family Medicine

## 2011-08-17 ENCOUNTER — Telehealth: Payer: Self-pay | Admitting: Family Medicine

## 2011-08-17 NOTE — Telephone Encounter (Signed)
Caller: Katherine Ware/Patient; PCP: Nelwyn Salisbury.; CB#: [phone]; ; ; Call regarding Medication Verification;  She said blood pressure medicine was recently changed and she just wanted to make sure she was taking the right amt.  I verified in Epic she is to be taking 25 mg of Metoprolol.  I had her verify with me the strength on the bottle and it is 25 mg so I told her she is to take one pill a Futures trader

## 2011-09-01 ENCOUNTER — Other Ambulatory Visit: Payer: Self-pay | Admitting: Family Medicine

## 2011-09-01 ENCOUNTER — Other Ambulatory Visit: Payer: Self-pay

## 2011-09-01 MED ORDER — LISINOPRIL 20 MG PO TABS: 20.0000 mg | ORAL_TABLET | Freq: Every day | ORAL | Status: DC

## 2011-09-09 ENCOUNTER — Telehealth: Payer: Self-pay | Admitting: Family Medicine

## 2011-09-09 NOTE — Telephone Encounter (Signed)
Caller: Douglas/Spouse; Patient Name: Katherine Ware; PCP: Nelwyn Salisbury.; Best Callback Phone Number: [phone]. Caller reports this lady had a fever of 103 Orally around 7pm last night, Tues 8/13. Caller reports she was "talking out of her head."  Fever reduced to nml appx 60 mins after she was given ASA and Motrin. She is afebrile this am at 98.2 O. Caller asking for an appt for Thurs to "be sure she does not have another uti or resp illness." No sxs of illness at all. Appetite is nml for patient, taking fluids well, sleeping well. Per Adult Fever Protocol, All other situations, home care given, to be used in the event fever returns. Appt scheduled as requested for Thurs 8/15 at 11am with Dr Abran Cantor. Callers are agreeable.

## 2011-09-10 ENCOUNTER — Ambulatory Visit (INDEPENDENT_AMBULATORY_CARE_PROVIDER_SITE_OTHER): Payer: Medicare Other | Admitting: Family Medicine

## 2011-09-10 ENCOUNTER — Encounter: Payer: Self-pay | Admitting: Family Medicine

## 2011-09-10 VITALS — Temp 98.7°F

## 2011-09-10 DIAGNOSIS — R300 Dysuria: Secondary | ICD-10-CM

## 2011-09-10 DIAGNOSIS — R509 Fever, unspecified: Secondary | ICD-10-CM

## 2011-09-10 DIAGNOSIS — N390 Urinary tract infection, site not specified: Secondary | ICD-10-CM

## 2011-09-10 LAB — CBC WITH DIFFERENTIAL/PLATELET
Basophils Absolute: 0 10*3/uL (ref 0.0–0.1)
Basophils Relative: 0.3 % (ref 0.0–3.0)
Eosinophils Absolute: 0.2 10*3/uL (ref 0.0–0.7)
Hemoglobin: 10.8 g/dL — ABNORMAL LOW (ref 12.0–15.0)
Lymphs Abs: 1.6 10*3/uL (ref 0.7–4.0)
MCHC: 32.6 g/dL (ref 30.0–36.0)
MCV: 93 fl (ref 78.0–100.0)
Monocytes Absolute: 1.7 10*3/uL — ABNORMAL HIGH (ref 0.1–1.0)
Neutro Abs: 12.1 10*3/uL — ABNORMAL HIGH (ref 1.4–7.7)
RBC: 3.54 Mil/uL — ABNORMAL LOW (ref 3.87–5.11)
RDW: 14.2 % (ref 11.5–14.6)

## 2011-09-10 LAB — POCT URINALYSIS DIPSTICK
Ketones, UA: NEGATIVE
Spec Grav, UA: 1.02
Urobilinogen, UA: 0.2
pH, UA: 7

## 2011-09-10 LAB — BASIC METABOLIC PANEL
CO2: 28 mEq/L (ref 19–32)
Chloride: 98 mEq/L (ref 96–112)
Glucose, Bld: 106 mg/dL — ABNORMAL HIGH (ref 70–99)
Sodium: 136 mEq/L (ref 135–145)

## 2011-09-10 MED ORDER — CEFTRIAXONE SODIUM 1 G IJ SOLR
1.0000 g | Freq: Once | INTRAMUSCULAR | Status: AC
  Administered 2011-09-10: 1 g via INTRAMUSCULAR

## 2011-09-10 MED ORDER — DOXYCYCLINE HYCLATE 100 MG PO CAPS: 100.0000 mg | ORAL_CAPSULE | Freq: Two times a day (BID) | ORAL | Status: AC

## 2011-09-10 NOTE — Addendum Note (Signed)
Addended by: Duard Brady I on: 09/10/2011 12:51 PM   Modules accepted: Orders

## 2011-09-10 NOTE — Progress Notes (Signed)
  Subjective:    Patient ID: Katherine Ware, female    DOB: 05-16-1940, 71 y.o.   MRN: [id number]  HPI Here with her husband and sitter for 3 days of fevers to 103 degrees, weakness, and chills. No cough or SOB. No NVD. She has been treated twice this summer for UTIs, and her culture last month grew MRSA. This was sensitive to Septra, and she took 10 days of this last month. She seemed to recover at first but now the infection has come back. She drinks plenty of water.    Review of Systems  Constitutional: Positive for fever and fatigue.  Respiratory: Negative.   Cardiovascular: Negative.   Gastrointestinal: Negative.   Genitourinary: Negative.        Objective:   Physical Exam  Constitutional:       Alert, weak, in her wheelchair   Cardiovascular: Normal rate, regular rhythm, normal heart sounds and intact distal pulses.   Pulmonary/Chest: Effort normal and breath sounds normal.  Abdominal: Soft. Bowel sounds are normal. She exhibits no distension and no mass. There is no tenderness. There is no rebound and no guarding.          Assessment & Plan:  Recurrent UTIs, now with pyelonephritis. Given a shot of Rocephin. Start on 30 days of Doxycycline. Labs and urine culture pending.

## 2011-09-11 ENCOUNTER — Other Ambulatory Visit: Payer: Self-pay | Admitting: Family Medicine

## 2011-09-11 MED ORDER — POTASSIUM CHLORIDE 20 MEQ PO PACK: 20.0000 meq | PACK | Freq: Once | ORAL | Status: DC

## 2011-09-11 NOTE — Progress Notes (Signed)
Quick Note:  Spoke with husband and informed of lab and med to start - [facility] [place] ______

## 2011-09-12 LAB — URINE CULTURE: Colony Count: >=100000

## 2011-09-14 ENCOUNTER — Telehealth: Payer: Self-pay | Admitting: Family Medicine

## 2011-09-14 NOTE — Telephone Encounter (Signed)
Opened in Error.

## 2011-09-14 NOTE — Progress Notes (Signed)
Quick Note:  I spoke with pt ______ 

## 2011-11-16 ENCOUNTER — Encounter: Payer: Self-pay | Admitting: Family Medicine

## 2011-11-16 ENCOUNTER — Ambulatory Visit (INDEPENDENT_AMBULATORY_CARE_PROVIDER_SITE_OTHER): Payer: Medicare Other | Admitting: Family Medicine

## 2011-11-16 VITALS — BP 106/68 | HR 86 | Temp 99.0°F

## 2011-11-16 DIAGNOSIS — IMO0002 Reserved for concepts with insufficient information to code with codable children: Secondary | ICD-10-CM

## 2011-11-16 DIAGNOSIS — G20 Parkinson's disease: Secondary | ICD-10-CM

## 2011-11-16 DIAGNOSIS — I6789 Other cerebrovascular disease: Secondary | ICD-10-CM

## 2011-11-16 DIAGNOSIS — N390 Urinary tract infection, site not specified: Secondary | ICD-10-CM

## 2011-11-16 DIAGNOSIS — M545 Low back pain: Secondary | ICD-10-CM

## 2011-11-16 DIAGNOSIS — E781 Pure hyperglyceridemia: Secondary | ICD-10-CM

## 2011-11-16 DIAGNOSIS — G238 Other specified degenerative diseases of basal ganglia: Secondary | ICD-10-CM

## 2011-11-16 DIAGNOSIS — K219 Gastro-esophageal reflux disease without esophagitis: Secondary | ICD-10-CM

## 2011-11-16 DIAGNOSIS — G231 Progressive supranuclear ophthalmoplegia [Steele-Richardson-Olszewski]: Secondary | ICD-10-CM

## 2011-11-16 LAB — POCT URINALYSIS DIPSTICK
Bilirubin, UA: NEGATIVE
Glucose, UA: NEGATIVE
Nitrite, UA: NEGATIVE
Spec Grav, UA: 1.015
Urobilinogen, UA: 0.2
pH, UA: 6.5

## 2011-11-16 MED ORDER — OXYBUTYNIN CHLORIDE 5 MG PO TABS: 5.0000 mg | ORAL_TABLET | Freq: Two times a day (BID) | ORAL | Status: DC

## 2011-11-16 MED ORDER — POTASSIUM CHLORIDE 20 MEQ PO PACK: 20.0000 meq | PACK | Freq: Once | ORAL | Status: DC

## 2011-11-16 MED ORDER — HYDROCODONE-ACETAMINOPHEN 5-325 MG PO TABS: 1.0000 | ORAL_TABLET | ORAL | Status: DC | PRN

## 2011-11-16 MED ORDER — SULFAMETHOXAZOLE-TRIMETHOPRIM 800-160 MG PO TABS: 1.0000 | ORAL_TABLET | Freq: Two times a day (BID) | ORAL | Status: DC

## 2011-11-16 MED ORDER — METOPROLOL SUCCINATE ER 25 MG PO TB24: 25.0000 mg | ORAL_TABLET | Freq: Every day | ORAL | Status: AC

## 2011-11-16 MED ORDER — CLOPIDOGREL BISULFATE 75 MG PO TABS: 75.0000 mg | ORAL_TABLET | Freq: Every day | ORAL | Status: DC

## 2011-11-16 MED ORDER — CARBIDOPA-LEVODOPA 25-100 MG PO TABS: 1.0000 | ORAL_TABLET | Freq: Three times a day (TID) | ORAL | Status: DC

## 2011-11-16 MED ORDER — HYDROCHLOROTHIAZIDE 25 MG PO TABS: 25.0000 mg | ORAL_TABLET | Freq: Every day | ORAL | Status: DC

## 2011-11-16 MED ORDER — VENLAFAXINE HCL 37.5 MG PO TABS: 37.5000 mg | ORAL_TABLET | Freq: Every day | ORAL | Status: DC

## 2011-11-16 MED ORDER — DOXYCYCLINE HYCLATE 100 MG PO CAPS: 100.0000 mg | ORAL_CAPSULE | Freq: Every day | ORAL | Status: AC

## 2011-11-16 MED ORDER — LISINOPRIL 20 MG PO TABS: 20.0000 mg | ORAL_TABLET | Freq: Every day | ORAL | Status: DC

## 2011-11-16 MED ORDER — ATORVASTATIN CALCIUM 20 MG PO TABS: 20.0000 mg | ORAL_TABLET | Freq: Every day | ORAL | Status: DC

## 2011-11-16 NOTE — Addendum Note (Signed)
Addended by: Aniceto Boss A on: 11/16/2011 02:09 PM   Modules accepted: Orders

## 2011-11-16 NOTE — Progress Notes (Signed)
  Subjective:    Patient ID: Katherine Ware, female    DOB: 1940/08/04, 71 y.o.   MRN: [id number]  HPI Here for several things. She thinks she has another UTI because she has had burning for the past week. No fever or back pain. Her culture in July grew MRSA, then another culture in August was not helpful. She has had 4 UTIs in the past year. She takes showers not baths, but she is incontinent and she wears Depends. She needs refills on all her meds. Also she has a broken tooth that needs to be pulled by her dentist, a Dr. Tenny Craw. He needs my authorization to stop the Plavix for a few days before this.    Review of Systems  Constitutional: Negative.   Respiratory: Negative.   Cardiovascular: Negative.   Gastrointestinal: Negative.   Genitourinary: Positive for dysuria, urgency and frequency.       Objective:   Physical Exam  Constitutional: She appears well-developed and well-nourished.  Cardiovascular: Normal rate, regular rhythm, normal heart sounds and intact distal pulses.   Pulmonary/Chest: Effort normal and breath sounds normal.  Abdominal: Soft. Bowel sounds are normal. She exhibits no distension and no mass. There is no tenderness. There is no rebound and no guarding.          Assessment & Plan:  We will treat the acute UTI with a round of Septra DS. Culture the urine. After this acute infection is taken care of, we will begin daily Doxycycline for prophylaxis. Refilled her meds. We wrote a note for her dentist to allow her to hold the Plavix for 5 days before her procedure, and to resume taking it after that.

## 2011-11-19 LAB — URINE CULTURE

## 2011-11-24 NOTE — Progress Notes (Signed)
Quick Note:  I spoke with pt ______ 

## 2011-11-27 ENCOUNTER — Other Ambulatory Visit: Payer: Self-pay | Admitting: Family Medicine

## 2011-12-04 ENCOUNTER — Telehealth: Payer: Self-pay | Admitting: Family Medicine

## 2011-12-04 NOTE — Addendum Note (Signed)
Addended by: Gershon Crane A on: 12/04/2011 02:19 PM   Modules accepted: Orders

## 2011-12-04 NOTE — Telephone Encounter (Signed)
I spoke with pt and went over the below information. 

## 2011-12-04 NOTE — Telephone Encounter (Signed)
Caller: Bellina/Patient, Caregiver- Sharolyn Douglas; Patient Name: Katherine Ware; PCP: Gershon Crane [facility]); Best Callback Phone Number: [phone]; Reason for call: Urinary Pain/Bleeding.  Patient states seen in the office 11/16/11 for UTI .  She has four UTI's in the past year.  She was placed on antibotiic Doxycycline and this is the second antibiotic she has been on in the last few week.  Temp 98.1 AX .  She is complaining of pain "up in her' , that is constant. +weak feeling. No blood in urine, +cloudy in color. She is able to urinate - Last UOP- some incontinence/wears pad.  Her Care giver is with her. She is drinking some. Emergent s/sx ruled out per Urinary Symptoms Female Protocol with the exception to - "evaluated by provider and symptoms worsening after following treatment plan for 72 hours. ". See provider in 24 hours. Epic reviewed for medication, history and allergies.  Patient states she cannot be seen today. Declines appt. Home care advice and call back parameters reviewed .  Advised i would forward messge to Dr . Clent Ridges.  PATIENT WITH UTI/DIAGNOSED ALREADY ON DOXYCYCLINE. COMPLAINING OF UTI PAIN, URINE CLOUDY. CANNOT COME TO OFFICE TODAY. PLEASE ADVISE.

## 2011-12-04 NOTE — Telephone Encounter (Signed)
Stay on the Doxycycline. Drink plenty of water. I will refer her to Urology for these recurrent infections.

## 2011-12-04 NOTE — Telephone Encounter (Signed)
Attempted to call pt no answer.  Will attempt to call at a later time.

## 2011-12-08 ENCOUNTER — Other Ambulatory Visit: Payer: Self-pay | Admitting: Urology

## 2011-12-08 ENCOUNTER — Ambulatory Visit (INDEPENDENT_AMBULATORY_CARE_PROVIDER_SITE_OTHER): Payer: Medicare Other | Admitting: Urology

## 2011-12-08 DIAGNOSIS — N952 Postmenopausal atrophic vaginitis: Secondary | ICD-10-CM

## 2011-12-08 DIAGNOSIS — N390 Urinary tract infection, site not specified: Secondary | ICD-10-CM

## 2011-12-08 DIAGNOSIS — R319 Hematuria, unspecified: Secondary | ICD-10-CM

## 2011-12-08 DIAGNOSIS — R3129 Other microscopic hematuria: Secondary | ICD-10-CM

## 2011-12-11 ENCOUNTER — Ambulatory Visit (HOSPITAL_COMMUNITY)
Admission: RE | Admit: 2011-12-11 | Discharge: 2011-12-11 | Disposition: A | Discharge status: Home / Self Care | Payer: Medicare Other | Source: Ambulatory Visit | Attending: Urology | Admitting: Urology

## 2011-12-11 DIAGNOSIS — N134 Hydroureter: Secondary | ICD-10-CM | POA: Insufficient documentation

## 2011-12-11 DIAGNOSIS — K829 Disease of gallbladder, unspecified: Secondary | ICD-10-CM | POA: Insufficient documentation

## 2011-12-11 DIAGNOSIS — Z9889 Other specified postprocedural states: Secondary | ICD-10-CM | POA: Insufficient documentation

## 2011-12-11 DIAGNOSIS — R319 Hematuria, unspecified: Secondary | ICD-10-CM

## 2011-12-11 DIAGNOSIS — K7689 Other specified diseases of liver: Secondary | ICD-10-CM | POA: Insufficient documentation

## 2011-12-11 LAB — POCT I-STAT, CHEM 8
BUN: 32 mg/dL — ABNORMAL HIGH (ref 6–23)
Chloride: 108 mEq/L (ref 96–112)
Creatinine, Ser: 0.9 mg/dL (ref 0.50–1.10)
Potassium: 4.5 mEq/L (ref 3.5–5.1)
Sodium: 138 mEq/L (ref 135–145)

## 2011-12-11 MED ORDER — IOHEXOL 300 MG/ML  SOLN
125.0000 mL | Freq: Once | INTRAMUSCULAR | Status: AC | PRN
  Administered 2011-12-11: 125 mL via INTRAVENOUS

## 2011-12-11 NOTE — Progress Notes (Signed)
IV started in right dorsal hand with 22g angiocath. Blood sample obtained from right hand IV for Creatnine level

## 2011-12-16 ENCOUNTER — Telehealth: Payer: Self-pay | Admitting: Family Medicine

## 2011-12-16 DIAGNOSIS — G20 Parkinson's disease: Secondary | ICD-10-CM

## 2011-12-16 MED ORDER — DICLOFENAC SODIUM 1 % TD GEL: 2.0000 g | Freq: Two times a day (BID) | TRANSDERMAL | Status: DC

## 2011-12-16 MED ORDER — TEMAZEPAM 30 MG PO CAPS: 30.0000 mg | ORAL_CAPSULE | Freq: Every evening | ORAL | Status: DC | PRN

## 2011-12-16 NOTE — Telephone Encounter (Signed)
The referral was put in, and her rx is ready to be faxed

## 2011-12-16 NOTE — Telephone Encounter (Signed)
Patient called stating that she has run out of refills of her diclofenac sodium (VOLTAREN) 1 % GEL [[id number]] Apply 1 application topically 2 (two) times daily and patient would like for the MD togive her something to help her sleep as she has not been able to sleep and would like to have something non-habit forming. Patient is due for her check up with Dr. Dan Humphreys and they state her insurance is requiring a referral as this is her Parkinson's MD in [facility] Dept. Please advise/assist.

## 2011-12-16 NOTE — Telephone Encounter (Signed)
I spoke to pt and faxed scripts.

## 2011-12-16 NOTE — Telephone Encounter (Signed)
Pt called back and is req that meds from previous note be sent in to [facility] mail order pharmacy.

## 2011-12-25 ENCOUNTER — Telehealth: Payer: Self-pay | Admitting: Family Medicine

## 2011-12-25 NOTE — Telephone Encounter (Signed)
Spoke to pt told her unable to call abx in, but can try OTC Monistat if symptoms persist call on Monday for an appointment. Pt verbalized understanding.

## 2011-12-25 NOTE — Telephone Encounter (Signed)
Pt has a yeast inf. Pt req an salve or abx to be called in to [facility] in [place]. Pt req a call back from nurse. Pt is aware that Dr Clent Ridges is out of the office today.

## 2011-12-29 ENCOUNTER — Other Ambulatory Visit: Payer: Self-pay | Admitting: Urology

## 2011-12-29 ENCOUNTER — Ambulatory Visit (INDEPENDENT_AMBULATORY_CARE_PROVIDER_SITE_OTHER): Payer: Medicare Other | Admitting: Urology

## 2011-12-29 DIAGNOSIS — N390 Urinary tract infection, site not specified: Secondary | ICD-10-CM

## 2011-12-29 DIAGNOSIS — N1330 Unspecified hydronephrosis: Secondary | ICD-10-CM

## 2011-12-29 DIAGNOSIS — N952 Postmenopausal atrophic vaginitis: Secondary | ICD-10-CM

## 2011-12-30 ENCOUNTER — Telehealth: Payer: Self-pay | Admitting: Family Medicine

## 2011-12-30 MED ORDER — DICLOFENAC SODIUM 1 % TD GEL: 2.0000 g | Freq: Two times a day (BID) | TRANSDERMAL | Status: DC

## 2011-12-30 NOTE — Telephone Encounter (Signed)
I resent to [facility]

## 2011-12-30 NOTE — Telephone Encounter (Signed)
Pt called and said that script for diclofenac sodium (VOLTAREN) 1 % GEL was supposed to be sent to [facility] mail order pharmacy.

## 2012-01-06 ENCOUNTER — Ambulatory Visit (INDEPENDENT_AMBULATORY_CARE_PROVIDER_SITE_OTHER): Payer: Medicare Other | Admitting: Family Medicine

## 2012-01-06 ENCOUNTER — Encounter: Payer: Self-pay | Admitting: Family Medicine

## 2012-01-06 VITALS — BP 112/76 | HR 88 | Temp 98.9°F

## 2012-01-06 DIAGNOSIS — R319 Hematuria, unspecified: Secondary | ICD-10-CM

## 2012-01-06 DIAGNOSIS — N390 Urinary tract infection, site not specified: Secondary | ICD-10-CM

## 2012-01-06 LAB — POCT URINALYSIS DIPSTICK
Bilirubin, UA: NEGATIVE
Ketones, UA: NEGATIVE
Spec Grav, UA: 1.015
pH, UA: >=9

## 2012-01-06 MED ORDER — SULFAMETHOXAZOLE-TRIMETHOPRIM 800-160 MG PO TABS: 1.0000 | ORAL_TABLET | Freq: Two times a day (BID) | ORAL | Status: DC

## 2012-01-06 NOTE — Progress Notes (Signed)
  Subjective:    Patient ID: Katherine Ware, female    DOB: August 02, 1940, 71 y.o.   MRN: [id number]  HPI Here for 2 days of urinary burning and foul smelling urine. No fever. She has seen Dr. Retta Diones several times, and he thinks she has a neurogenic bladder. A CT scan revealed bilateral hydronephrosis and bilateral hydroureters with no signs of stones or obstruction. He has tried her on low dose Amitriptyline but she does not tolerate this well. She feels very weak and fatigued on this. Still on daily Doxycycline.    Review of Systems  Constitutional: Positive for fatigue. Negative for fever.  Respiratory: Negative.   Cardiovascular: Negative.   Genitourinary: Positive for dysuria, urgency and frequency.       Objective:   Physical Exam  Constitutional: She is oriented to person, place, and time. She appears well-developed and well-nourished.  Abdominal: Soft. Bowel sounds are normal. She exhibits no distension and no mass. There is no tenderness. There is no rebound and no guarding.  Neurological: She is oriented to person, place, and time.       Somewhat lethargic           Assessment & Plan:  Treat the acute UTI with Septra DS for 10 days. Culture the urine. Stop the Amitriptyline. She is scheduled to see Dr. Retta Diones again on 02-03-12. We will contact home health to set up a nursing visit once a week to assess her.

## 2012-01-07 ENCOUNTER — Inpatient Hospital Stay (HOSPITAL_COMMUNITY)
Admission: EM | Admit: 2012-01-07 | Discharge: 2012-01-15 | DRG: 683 | Disposition: A | Discharge status: Home / Self Care | Payer: Medicare Other | Attending: Internal Medicine | Admitting: Internal Medicine

## 2012-01-07 ENCOUNTER — Observation Stay (HOSPITAL_COMMUNITY): Payer: Medicare Other

## 2012-01-07 ENCOUNTER — Telehealth: Payer: Self-pay | Admitting: Family Medicine

## 2012-01-07 ENCOUNTER — Emergency Department (HOSPITAL_COMMUNITY): Payer: Medicare Other

## 2012-01-07 ENCOUNTER — Encounter (HOSPITAL_COMMUNITY): Payer: Self-pay | Admitting: Emergency Medicine

## 2012-01-07 DIAGNOSIS — B964 Proteus (mirabilis) (morganii) as the cause of diseases classified elsewhere: Secondary | ICD-10-CM | POA: Diagnosis present

## 2012-01-07 DIAGNOSIS — R29898 Other symptoms and signs involving the musculoskeletal system: Secondary | ICD-10-CM | POA: Diagnosis present

## 2012-01-07 DIAGNOSIS — L97409 Non-pressure chronic ulcer of unspecified heel and midfoot with unspecified severity: Secondary | ICD-10-CM | POA: Diagnosis present

## 2012-01-07 DIAGNOSIS — M810 Age-related osteoporosis without current pathological fracture: Secondary | ICD-10-CM | POA: Diagnosis present

## 2012-01-07 DIAGNOSIS — G20A1 Parkinson's disease without dyskinesia, without mention of fluctuations: Secondary | ICD-10-CM | POA: Diagnosis present

## 2012-01-07 DIAGNOSIS — N19 Unspecified kidney failure: Secondary | ICD-10-CM

## 2012-01-07 DIAGNOSIS — Z7982 Long term (current) use of aspirin: Secondary | ICD-10-CM

## 2012-01-07 DIAGNOSIS — G231 Progressive supranuclear ophthalmoplegia [Steele-Richardson-Olszewski]: Secondary | ICD-10-CM | POA: Diagnosis present

## 2012-01-07 DIAGNOSIS — Z66 Do not resuscitate: Secondary | ICD-10-CM | POA: Diagnosis present

## 2012-01-07 DIAGNOSIS — I10 Essential (primary) hypertension: Secondary | ICD-10-CM | POA: Diagnosis present

## 2012-01-07 DIAGNOSIS — R339 Retention of urine, unspecified: Secondary | ICD-10-CM | POA: Diagnosis present

## 2012-01-07 DIAGNOSIS — N179 Acute kidney failure, unspecified: Principal | ICD-10-CM | POA: Diagnosis present

## 2012-01-07 DIAGNOSIS — N1330 Unspecified hydronephrosis: Secondary | ICD-10-CM

## 2012-01-07 DIAGNOSIS — G20 Parkinson's disease: Secondary | ICD-10-CM

## 2012-01-07 DIAGNOSIS — E785 Hyperlipidemia, unspecified: Secondary | ICD-10-CM | POA: Diagnosis present

## 2012-01-07 DIAGNOSIS — N390 Urinary tract infection, site not specified: Secondary | ICD-10-CM | POA: Diagnosis present

## 2012-01-07 DIAGNOSIS — D72829 Elevated white blood cell count, unspecified: Secondary | ICD-10-CM | POA: Diagnosis present

## 2012-01-07 DIAGNOSIS — Z79899 Other long term (current) drug therapy: Secondary | ICD-10-CM

## 2012-01-07 DIAGNOSIS — G238 Other specified degenerative diseases of basal ganglia: Secondary | ICD-10-CM

## 2012-01-07 DIAGNOSIS — J45909 Unspecified asthma, uncomplicated: Secondary | ICD-10-CM | POA: Diagnosis present

## 2012-01-07 DIAGNOSIS — D649 Anemia, unspecified: Secondary | ICD-10-CM | POA: Diagnosis present

## 2012-01-07 LAB — URINALYSIS, ROUTINE W REFLEX MICROSCOPIC
Bilirubin Urine: NEGATIVE
Nitrite: NEGATIVE
Specific Gravity, Urine: 1.013 (ref 1.005–1.030)
Urobilinogen, UA: 0.2 mg/dL (ref 0.0–1.0)

## 2012-01-07 LAB — CBC WITH DIFFERENTIAL/PLATELET
Basophils Absolute: 0 10*3/uL (ref 0.0–0.1)
Eosinophils Absolute: 0.6 10*3/uL (ref 0.0–0.7)
Lymphs Abs: 1.4 10*3/uL (ref 0.7–4.0)
MCH: 29.4 pg (ref 26.0–34.0)
Neutrophils Relative %: 77 % (ref 43–77)
Platelets: 311 10*3/uL (ref 150–400)
RBC: 3.47 MIL/uL — ABNORMAL LOW (ref 3.87–5.11)
RDW: 14.3 % (ref 11.5–15.5)
WBC: 14.4 10*3/uL — ABNORMAL HIGH (ref 4.0–10.5)

## 2012-01-07 LAB — URINE MICROSCOPIC-ADD ON

## 2012-01-07 LAB — BASIC METABOLIC PANEL
BUN: 74 mg/dL — ABNORMAL HIGH (ref 6–23)
Chloride: 95 mEq/L — ABNORMAL LOW (ref 96–112)
Creatinine, Ser: 2.38 mg/dL — ABNORMAL HIGH (ref 0.50–1.10)
GFR calc Af Amer: 23 mL/min — ABNORMAL LOW (ref >90)

## 2012-01-07 LAB — SODIUM, URINE, RANDOM: Sodium, Ur: 59 mEq/L

## 2012-01-07 MED ORDER — SODIUM CHLORIDE 0.9 % IV BOLUS (SEPSIS)
1000.0000 mL | Freq: Once | INTRAVENOUS | Status: AC
  Administered 2012-01-07: 1000 mL via INTRAVENOUS

## 2012-01-07 MED ORDER — SODIUM CHLORIDE 0.9 % IV SOLN: Freq: Once | INTRAVENOUS | Status: DC

## 2012-01-07 MED ORDER — MORPHINE SULFATE 4 MG/ML IJ SOLN
4.0000 mg | Freq: Once | INTRAMUSCULAR | Status: AC
  Administered 2012-01-07: 4 mg via INTRAVENOUS
  Filled 2012-01-07: qty 1

## 2012-01-07 MED ORDER — DEXTROSE 5 % IV SOLN
1.0000 g | Freq: Once | INTRAVENOUS | Status: AC
  Administered 2012-01-07: 1 g via INTRAVENOUS
  Filled 2012-01-07: qty 10

## 2012-01-07 NOTE — ED Notes (Signed)
Assisted Josh, EMT with placing pt on the stretcher from her personal wheelchair; myself and Josh, EMT undressed pt and placed her in a gown; warm blanket given; family at bedside

## 2012-01-07 NOTE — Telephone Encounter (Signed)
Daughters in Social worker called our office at 3:40 because they had not heard back on a possible admission. CAN call had not been routed to [place], so office was unaware of updates situation. Dr. Clent Ridges gone for the day. Daughter in law Katherine Ware reported that Katherine Ware fell Sat but never went to ED. UTI symptoms going on for almost a week. States fever of 102-103 for several days, alternating Ibuprofen & Advil. States pt's legs red and splotchy, now cannot feels toes. Is "talkig out of her head" and unable to swallow her pills. Crying a lot, urine still gravy consistency. Has chills as well. I spoke with Dr. Artist Pais and he recommended family take Katherine Ware to ED to be evaluated for admission. I spoke with both Katherine Ware and Katherine Ware (daughter in law as well). Family verbalized understanding and will transport to ED for eval.

## 2012-01-07 NOTE — Telephone Encounter (Signed)
Caller: Christie/Other; Phone: [phone]; Reason for Call: [name] is Katherine Ware's daughter-in-law and is requesting that she be directly admitted to the hospital.  Pt has been dealing with a UTI for four weeks now and is progressively getting worse.  Family believes she will need to be admitted so she can receive IV antibiotics.  Family instructed offices uses hospitalist for admissions, but requesting Dr Clent Ridges to try and facilitate a direct admission with the hospitalist.

## 2012-01-07 NOTE — ED Notes (Signed)
Pt c/o UTI sx x 2 days; pt seen by PCP yesterday for same and started on SMZ; pt feeling worse today with some fever at night and confusion; pt wheelchair bound due to parkinson's

## 2012-01-07 NOTE — ED Provider Notes (Addendum)
History     CSN: [id number]  Arrival date & time 01/07/12  1716   First MD Initiated Contact with Patient 01/07/12 1848      Chief Complaint  Patient presents with  . Urinary Tract Infection    (Consider location/radiation/quality/duration/timing/severity/associated sxs/prior treatment) HPI Comments: Pt comes in with cc of possible UTI. Pt has been having some dysuria since Thursday, and family reports that she has been appearing confused, mostly at night time. They have also noticed a new pressure ulcer in her legs. Pt is quite healthy, besides having peripheral palsy and being unstable from that. She has hx of UTI, but no admission in the recent past. No n/v/chills/abd pain/chest pain/sob. Pt has a new cough, dry, and she also has fevers, tmax 103. Pt is aox3 for me.  Patient is a 71 y.o. female presenting with urinary tract infection. The history is provided by the patient, medical records, a relative and the spouse.  Urinary Tract Infection Pertinent negatives include no chest pain, no abdominal pain and no shortness of breath.    Past Medical History  Diagnosis Date  . Hyperlipidemia   . Hypertension   . Asthma   . Hypokalemia   . Osteoporosis   . Osteoarthritis   . 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 Pearlean Brownie  . Parkinsonian syndrome     saw Darrol Angel NP   . Progressive supranuclear palsy     sees Dr. Dan Humphreys at [facility]   . Neurogenic bladder     sees Dr. Retta Diones   . Frequent UTI     Past Surgical History  Procedure Date  . Cholecystectomy   . Breast surgery     benign left breast cyst  . Knee surgery     left knee  . Carpal tunnel release     bilateral  . Epidural steroid     to lumbar spine 8-11 and 9-11  . Esophageal dilation     per Dr. Yancey Flemings   . Colonscopy 2010    per Dr. Marina Goodell, benign polyps, repeat 3 years   . Esophageal dilation     dr  Marina Goodell   . Tubal ligation   . Benign  right breat     Family History  Problem  Relation Age of Onset  . Cancer Mother   . Diabetes Mother   . Arthritis Father   . Heart disease Father   . Hypertension Father   . Stroke Father   . Diabetes Sister   . Diabetes Maternal Grandmother     History  Substance Use Topics  . Smoking status: Never Smoker   . Smokeless tobacco: Never Used  . Alcohol Use: No    OB History    Grav Para Term Preterm Abortions TAB SAB Ect Mult Living                  Review of Systems  Constitutional: Positive for fever. Negative for activity change.  HENT: Negative for facial swelling and neck pain.   Respiratory: Negative for cough, shortness of breath and wheezing.   Cardiovascular: Negative for chest pain.  Gastrointestinal: Negative for nausea, vomiting, abdominal pain, diarrhea, constipation, blood in stool and abdominal distention.  Genitourinary: Positive for dysuria. Negative for hematuria and difficulty urinating.  Skin: Negative for color change.  Neurological: Positive for dizziness. Negative for speech difficulty.  Hematological: Does not bruise/bleed easily.  Psychiatric/Behavioral: Positive for confusion.    Allergies  Sulfa antibiotics  Home  Medications   Current Outpatient Rx  Name  Route  Sig  Dispense  Refill  . ACETAMINOPHEN 500 MG PO TABS   Oral   Take 500 mg by mouth every 6 (six) hours as needed.         . ASPIRIN 81 MG PO TABS   Oral   Take 81 mg by mouth daily.         . ATORVASTATIN CALCIUM 20 MG PO TABS   Oral   Take 1 tablet (20 mg total) by mouth daily.   90 tablet   3   . CALCIUM CARB-VIT D-SOY ISOFLAV 600-200-25 MG-UNIT-MG PO TABS      daily   30 each   0   . CARBIDOPA-LEVODOPA 25-100 MG PO TABS   Oral   Take 1 tablet by mouth 3 (three) times daily.   270 tablet   3   . VITAMIN D 2000 UNITS PO CAPS   Oral   Take by mouth.           . CLOPIDOGREL BISULFATE 75 MG PO TABS   Oral   Take 1 tablet (75 mg total) by mouth daily.   90 tablet   3   . DICLOFENAC SODIUM 1  % TD GEL   Topical   Apply 2 g topically 2 (two) times daily.   400 g   3   . DOCUSATE SODIUM 100 MG PO CAPS   Oral   Take 100 mg by mouth daily.         Marland Kitchen DOXYCYCLINE HYCLATE 100 MG PO CAPS   Oral   Take 100 mg by mouth daily.         Marland Kitchen HYDROCHLOROTHIAZIDE 25 MG PO TABS   Oral   Take 1 tablet (25 mg total) by mouth daily.   90 tablet   3   . HYDROCODONE-ACETAMINOPHEN 5-325 MG PO TABS   Oral   Take 1 tablet by mouth every 4 (four) hours as needed for pain.   540 tablet   1   . IBUPROFEN 200 MG PO TABS   Oral   Take 200 mg by mouth every 6 (six) hours as needed. For pain         . LISINOPRIL 20 MG PO TABS   Oral   Take 1 tablet (20 mg total) by mouth daily.   90 tablet   3   . METOPROLOL SUCCINATE ER 25 MG PO TB24   Oral   Take 1 tablet (25 mg total) by mouth daily.   90 tablet   3   . ONE-DAILY MULTI VITAMINS PO TABS   Oral   Take 1 tablet by mouth daily.         Marland Kitchen OMEPRAZOLE 20 MG PO CPDR   Oral   Take 20 mg by mouth daily.         . OXYBUTYNIN CHLORIDE 5 MG PO TABS   Oral   Take 1 tablet (5 mg total) by mouth 2 (two) times daily.   180 tablet   3   . SULFAMETHOXAZOLE-TRIMETHOPRIM 800-160 MG PO TABS   Oral   Take 1 tablet by mouth 2 (two) times daily.   20 tablet   0   . VENLAFAXINE HCL 37.5 MG PO TABS   Oral   Take 1 tablet (37.5 mg total) by mouth daily.   90 tablet   3   . OMEPRAZOLE 20 MG PO CPDR   Oral   Take  20 mg by mouth daily.             BP 101/58  Pulse 95  Temp 98.8 F (37.1 C) (Oral)  Resp 18  SpO2 95%  Physical Exam  Nursing note and vitals reviewed. Constitutional: She is oriented to person, place, and time. She appears well-developed.  HENT:  Head: Normocephalic and atraumatic.  Eyes: Conjunctivae normal and EOM are normal. Pupils are equal, round, and reactive to light.  Neck: Normal range of motion. Neck supple.  Cardiovascular: Normal rate and regular rhythm.   Murmur heard.      Bibasilar  crackles  Pulmonary/Chest: Effort normal and breath sounds normal. No respiratory distress.  Abdominal: Soft. Bowel sounds are normal. She exhibits no distension. There is no tenderness. There is no rebound and no guarding.  Musculoskeletal:       Left heel - there is a 4 cm area with hyperpigmentation at the heel with surrounding erythema. Tender to palpation  Neurological: She is alert and oriented to person, place, and time.  Skin: Skin is warm and dry.    ED Course  Procedures (including critical care time)  Labs Reviewed  CBC WITH DIFFERENTIAL - Abnormal; Notable for the following:    WBC 14.4 (*)     RBC 3.47 (*)     Hemoglobin 10.2 (*)     HCT 31.0 (*)     Neutro Abs 11.1 (*)     Lymphocytes Relative 10 (*)     Monocytes Absolute 1.3 (*)     All other components within normal limits  BASIC METABOLIC PANEL - Abnormal; Notable for the following:    Sodium 134 (*)     Chloride 95 (*)     Glucose, Bld 111 (*)     BUN 74 (*)     Creatinine, Ser 2.38 (*)     GFR calc non Af Amer 20 (*)     GFR calc Af Amer 23 (*)     All other components within normal limits  URINALYSIS, ROUTINE W REFLEX MICROSCOPIC  MAGNESIUM  URINE CULTURE  LACTIC ACID, PLASMA  TROPONIN I  SODIUM, URINE, RANDOM  CREATININE, URINE, RANDOM   No results found.   No diagnosis found.    MDM  DDx: Sepsis syndrome ACS syndrome Stroke CHF exacerbation Infection - pneumonia/UTI/Cellulitis PE Dehydration Electrolyte abnormality Tox syndrome  Pt comes in with cc of intermittent mental status changes, and some dysuria. She is also febrile. Initial impression is that patient has urosepsis. She is on bactrim, day 2 today, and no recent admissions. Currently aox3. Does have a foot ulcer - that is unstageable at this time - but it could have superimposed infection as well.  Will get basic labs and reassess. Possible admission.   Derwood Kaplan, MD 01/07/12 1957  9:51 PM Pt has acute kidney  injury. Had a CT with contrast yday - possible contrast induced? Possible post obstructive. She also has a UTI - ceftriaxone given. Urology called - recommend foley.  Derwood Kaplan, MD 01/07/12 2219   Date: 01/07/2012  Rate: 97  Rhythm: normal sinus rhythm  QRS Axis: normal  Intervals: normal  ST/T Wave abnormalities: nonspecific ST/T changes  Conduction Disutrbances:right bundle branch block  Narrative Interpretation:   Old EKG Reviewed: unchanged    Derwood Kaplan, MD 01/07/12 2229

## 2012-01-07 NOTE — ED Notes (Signed)
Pt's family st's pt has had a UTI and been taking antibiotic for same just not getting any better.  St's pt's MD sent pt to ED for IV antibiotics for same.  Family at bedside.

## 2012-01-08 ENCOUNTER — Ambulatory Visit: Payer: Medicare Other | Admitting: Family Medicine

## 2012-01-08 ENCOUNTER — Encounter (HOSPITAL_COMMUNITY): Payer: Self-pay | Admitting: *Deleted

## 2012-01-08 DIAGNOSIS — G20 Parkinson's disease: Secondary | ICD-10-CM

## 2012-01-08 DIAGNOSIS — N1330 Unspecified hydronephrosis: Secondary | ICD-10-CM

## 2012-01-08 DIAGNOSIS — L97409 Non-pressure chronic ulcer of unspecified heel and midfoot with unspecified severity: Secondary | ICD-10-CM

## 2012-01-08 LAB — CBC
HCT: 27.5 % — ABNORMAL LOW (ref 36.0–46.0)
Hemoglobin: 9.3 g/dL — ABNORMAL LOW (ref 12.0–15.0)
WBC: 12.3 10*3/uL — ABNORMAL HIGH (ref 4.0–10.5)

## 2012-01-08 LAB — URINE CULTURE

## 2012-01-08 LAB — BASIC METABOLIC PANEL
Chloride: 103 mEq/L (ref 96–112)
GFR calc Af Amer: 31 mL/min — ABNORMAL LOW (ref >90)
Potassium: 4 mEq/L (ref 3.5–5.1)

## 2012-01-08 LAB — GLUCOSE, CAPILLARY: Glucose-Capillary: 148 mg/dL — ABNORMAL HIGH (ref 70–99)

## 2012-01-08 MED ORDER — VANCOMYCIN HCL 10 G IV SOLR
1250.0000 mg | Freq: Once | INTRAVENOUS | Status: AC
  Administered 2012-01-08: 1250 mg via INTRAVENOUS
  Filled 2012-01-08: qty 1250

## 2012-01-08 MED ORDER — HYDRALAZINE HCL 20 MG/ML IJ SOLN
10.0000 mg | INTRAMUSCULAR | Status: DC | PRN
  Filled 2012-01-08: qty 0.5

## 2012-01-08 MED ORDER — HYDROCODONE-ACETAMINOPHEN 5-325 MG PO TABS
1.0000 | ORAL_TABLET | ORAL | Status: DC | PRN
  Administered 2012-01-08 – 2012-01-10 (×8): 1 via ORAL
  Filled 2012-01-08 (×8): qty 1

## 2012-01-08 MED ORDER — SODIUM CHLORIDE 0.9 % IV SOLN
250.0000 mg | Freq: Four times a day (QID) | INTRAVENOUS | Status: DC
  Administered 2012-01-08 – 2012-01-09 (×5): 250 mg via INTRAVENOUS
  Filled 2012-01-08 (×6): qty 250

## 2012-01-08 MED ORDER — ENSURE COMPLETE PO LIQD
237.0000 mL | Freq: Two times a day (BID) | ORAL | Status: DC
  Administered 2012-01-08 – 2012-01-10 (×3): 237 mL via ORAL

## 2012-01-08 MED ORDER — SODIUM CHLORIDE 0.9 % IV SOLN
250.0000 mg | Freq: Two times a day (BID) | INTRAVENOUS | Status: DC
  Administered 2012-01-08 (×2): 250 mg via INTRAVENOUS
  Filled 2012-01-08 (×3): qty 250

## 2012-01-08 MED ORDER — METOPROLOL SUCCINATE 12.5 MG HALF TABLET
12.5000 mg | ORAL_TABLET | Freq: Every day | ORAL | Status: DC
  Administered 2012-01-09 – 2012-01-15 (×7): 12.5 mg via ORAL
  Filled 2012-01-08 (×7): qty 1

## 2012-01-08 MED ORDER — OXYBUTYNIN CHLORIDE 5 MG PO TABS
5.0000 mg | ORAL_TABLET | Freq: Two times a day (BID) | ORAL | Status: DC
  Administered 2012-01-08 – 2012-01-15 (×16): 5 mg via ORAL
  Filled 2012-01-08 (×17): qty 1

## 2012-01-08 MED ORDER — ACETAMINOPHEN 650 MG RE SUPP: 650.0000 mg | Freq: Four times a day (QID) | RECTAL | Status: DC | PRN

## 2012-01-08 MED ORDER — VANCOMYCIN HCL IN DEXTROSE 1-5 GM/200ML-% IV SOLN: 1000.0000 mg | Freq: Once | INTRAVENOUS | Status: DC

## 2012-01-08 MED ORDER — ONDANSETRON HCL 4 MG/2ML IJ SOLN: 4.0000 mg | Freq: Four times a day (QID) | INTRAMUSCULAR | Status: DC | PRN

## 2012-01-08 MED ORDER — ATORVASTATIN CALCIUM 20 MG PO TABS
20.0000 mg | ORAL_TABLET | Freq: Every day | ORAL | Status: DC
  Administered 2012-01-08 – 2012-01-15 (×8): 20 mg via ORAL
  Filled 2012-01-08 (×8): qty 1

## 2012-01-08 MED ORDER — ONDANSETRON HCL 4 MG/2ML IJ SOLN: 4.0000 mg | Freq: Three times a day (TID) | INTRAMUSCULAR | Status: DC | PRN

## 2012-01-08 MED ORDER — ADULT MULTIVITAMIN W/MINERALS CH
1.0000 | ORAL_TABLET | Freq: Every day | ORAL | Status: DC
  Administered 2012-01-08 – 2012-01-15 (×8): 1 via ORAL
  Filled 2012-01-08 (×8): qty 1

## 2012-01-08 MED ORDER — SODIUM CHLORIDE 0.9 % IV SOLN: INTRAVENOUS | Status: DC

## 2012-01-08 MED ORDER — ONDANSETRON HCL 4 MG PO TABS: 4.0000 mg | ORAL_TABLET | Freq: Four times a day (QID) | ORAL | Status: DC | PRN

## 2012-01-08 MED ORDER — SODIUM CHLORIDE 0.9 % IJ SOLN
3.0000 mL | Freq: Two times a day (BID) | INTRAMUSCULAR | Status: DC
  Administered 2012-01-08 – 2012-01-15 (×11): 3 mL via INTRAVENOUS

## 2012-01-08 MED ORDER — ACETAMINOPHEN 325 MG PO TABS
650.0000 mg | ORAL_TABLET | Freq: Four times a day (QID) | ORAL | Status: DC | PRN
  Administered 2012-01-09 – 2012-01-13 (×5): 650 mg via ORAL
  Filled 2012-01-08 (×5): qty 2

## 2012-01-08 MED ORDER — ENSURE PUDDING PO PUDG
1.0000 | Freq: Three times a day (TID) | ORAL | Status: DC
  Administered 2012-01-08 – 2012-01-15 (×13): 1 via ORAL

## 2012-01-08 MED ORDER — SODIUM CHLORIDE 0.9 % IV SOLN
INTRAVENOUS | Status: AC
  Administered 2012-01-08 – 2012-01-09 (×3): via INTRAVENOUS

## 2012-01-08 MED ORDER — VENLAFAXINE HCL 37.5 MG PO TABS
37.5000 mg | ORAL_TABLET | Freq: Every day | ORAL | Status: DC
  Administered 2012-01-08 – 2012-01-15 (×8): 37.5 mg via ORAL
  Filled 2012-01-08 (×9): qty 1

## 2012-01-08 MED ORDER — PANTOPRAZOLE SODIUM 40 MG PO TBEC
40.0000 mg | DELAYED_RELEASE_TABLET | Freq: Every day | ORAL | Status: DC
  Administered 2012-01-08 – 2012-01-15 (×8): 40 mg via ORAL
  Filled 2012-01-08 (×5): qty 1

## 2012-01-08 MED ORDER — METOPROLOL SUCCINATE ER 25 MG PO TB24
25.0000 mg | ORAL_TABLET | Freq: Every day | ORAL | Status: DC
  Administered 2012-01-08: 25 mg via ORAL
  Filled 2012-01-08: qty 1

## 2012-01-08 MED ORDER — VANCOMYCIN HCL 1000 MG IV SOLR
750.0000 mg | INTRAVENOUS | Status: DC
  Administered 2012-01-09: 750 mg via INTRAVENOUS
  Filled 2012-01-08: qty 750

## 2012-01-08 MED ORDER — DOCUSATE SODIUM 100 MG PO CAPS
100.0000 mg | ORAL_CAPSULE | Freq: Every day | ORAL | Status: DC
  Administered 2012-01-08 – 2012-01-15 (×6): 100 mg via ORAL
  Filled 2012-01-08 (×8): qty 1

## 2012-01-08 MED ORDER — CARBIDOPA-LEVODOPA 25-100 MG PO TABS
1.0000 | ORAL_TABLET | Freq: Three times a day (TID) | ORAL | Status: DC
  Administered 2012-01-08 – 2012-01-15 (×24): 1 via ORAL
  Filled 2012-01-08 (×25): qty 1

## 2012-01-08 MED ORDER — ASPIRIN EC 81 MG PO TBEC: 81.0000 mg | DELAYED_RELEASE_TABLET | Freq: Every day | ORAL | Status: DC

## 2012-01-08 NOTE — Progress Notes (Addendum)
I was consulted regarding progressive worsening of gait and possible neurogenic bladder in this patient with PSP. On my arrival to the room, she was in significant pain and indicated that it was in her groin area that she was experiencing this. A catheterization was done and what appeared to be frank blood was returned. She does have increased tone, however she was unable to comply with any further exam at this time due to her pain.   At this time, I do not that an examination would be of much use given her inability to cooperate. Once her pain and urinary issues has been better controlled, please call and we can further evaluate her. I discussed this with Dr. Toniann Fail.   Ritta Slot, MD [facility] [phone]  If 7pm- 7am, please page neurology on call at [phone].

## 2012-01-08 NOTE — Consult Note (Signed)
Urology Consult   Physician requesting consult: Dr. Toniann Fail Reason for consult: Hydronephrosis  History of Present Illness: Katherine Ware is a 71 y.o. female who I have seen twice before him a him initially referred by Dr. Gershon Crane. She does have a history of supranuclear palsy. Her initial evaluation was on 12/08/2011. At that point, over the past year she had had at least for symptomatic UTIs including Escherichia coli, methicillin-resistant staph and Proteus. Because of the possible history of pneumaturia, she underwent CT of the abdomen and pelvis earlier this month, as well as cystoscopy on 12/29/2011. That revealed significant trabeculation without evidence of urothelial abnormalities/carcinoma. I did not see the ureteral orifice is secondary to her significant trabeculations. Her CT of the abdomen and pelvis revealed bilateral hydroureteronephrosis with dilatation of the ureters down to her bladder, which was thickwalled throughout. There was filling of her ureters with the contrast. At that point, her creatinine was 0.6. I did put her on amitriptyline, with the hope that this would decrease her bladder contractility and perhaps treat ladder outlet obstruction. She did not tolerate this medication, and was stopped shortly after she initiated therapy with this.  She was admitted to the hospital last night with a few day history of decreased mental status and fever. She was found to have a UTI. Urinalysis on admission was obviously infected. A Foley catheter was placed due to incomplete emptying of her bladder, which was not tolerated because of bleeding and pain. Her creatinine prior to her CT scan earlier this month was 0.6. At the time of admission, it was 2.38. It has decreased to 1.86 this morning.  She is currently incontinent of urine. She is having no bladder pain. Mental status has improved somewhat.    Past Medical History  Diagnosis Date  . Hyperlipidemia   . Hypertension   .  Asthma   . Hypokalemia   . Osteoporosis   . Osteoarthritis   . 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 Pearlean Brownie  . Parkinsonian syndrome     saw Darrol Angel NP   . Progressive supranuclear palsy     sees Dr. Dan Humphreys at [facility]   . Neurogenic bladder     sees Dr. Retta Diones   . Frequent UTI     Past Surgical History  Procedure Date  . Cholecystectomy   . Breast surgery     benign left breast cyst  . Knee surgery     left knee  . Carpal tunnel release     bilateral  . Epidural steroid     to lumbar spine 8-11 and 9-11  . Esophageal dilation     per Dr. Yancey Flemings   . Colonscopy 2010    per Dr. Marina Goodell, benign polyps, repeat 3 years   . Esophageal dilation     dr  Marina Goodell   . Tubal ligation   . Benign  right breat      Current Hospital Medications: Scheduled Meds:   . atorvastatin  20 mg Oral Daily  . carbidopa-levodopa  1 tablet Oral TID WC  . docusate sodium  100 mg Oral Daily  . feeding supplement  237 mL Oral BID BM  . feeding supplement  1 Container Oral TID BM  . imipenem-cilastatin  250 mg Intravenous Q6H  . metoprolol succinate  12.5 mg Oral Daily  . multivitamin with minerals  1 tablet Oral Daily  . oxybutynin  5 mg Oral BID  . pantoprazole  40 mg  Oral Daily  . sodium chloride  3 mL Intravenous Q12H  . vancomycin  750 mg Intravenous Q24H  . venlafaxine  37.5 mg Oral Q breakfast   Continuous Infusions:   . sodium chloride 75 mL/hr at 01/08/12 0337   PRN Meds:.acetaminophen, acetaminophen, hydrALAZINE, HYDROcodone-acetaminophen, ondansetron (ZOFRAN) IV, ondansetron  Allergies:  Allergies  Allergen Reactions  . Sulfa Antibiotics     unknown    Family History  Problem Relation Age of Onset  . Cancer Mother   . Diabetes Mother   . Arthritis Father   . Heart disease Father   . Hypertension Father   . Stroke Father   . Diabetes Sister   . Diabetes Maternal Grandmother     Social History:  reports that she has never smoked. She has never  used smokeless tobacco. She reports that she does not drink alcohol or use illicit drugs.  ROS: Constitutional: Positive for fever. Negative for activity change.  HENT: Negative for facial swelling and neck pain.  Respiratory: Negative for cough, shortness of breath and wheezing.  Cardiovascular: Negative for chest pain.  Gastrointestinal: Negative for nausea, vomiting, abdominal pain, diarrhea, constipation, blood in stool and abdominal distention.  Genitourinary: Positive for dysuria. Negative for hematuria and difficulty urinating.  Skin: Negative for color change.  Neurological: Positive for dizziness. Negative for speech difficulty.  Hematological: Does not bruise/bleed easily.  Psychiatric/Behavioral: Positive for confusion.    Physical Exam:  Vital signs in last 24 hours: Temp:  [98.2 F (36.8 C)-99.9 F (37.7 C)] 98.5 F (36.9 C) (12/13 1358) Pulse Rate:  [95-114] 104  (12/13 1358) Resp:  [18-22] 18  (12/13 1358) BP: (99-133)/(54-69) 99/54 mmHg (12/13 1358) SpO2:  [90 %-95 %] 94 % (12/13 1358) Weight:  [81.4 kg (179 lb 7.3 oz)] 81.4 kg (179 lb 7.3 oz) (12/13 0017) General:  She is somewhat lethargic but is oriented and answers questions appropriately Respiratory: Normal rate, no laboring Skin: Warm and dry  Laboratory Data:   Basename 01/08/12 0600 01/07/12 1740  WBC 12.3* 14.4*  HGB 9.3* 10.2*  HCT 27.5* 31.0*  PLT 316 311     Basename 01/08/12 0600 01/07/12 1740  NA 138 134*  K 4.0 4.6  CL 103 95*  GLUCOSE 111* 111*  BUN 60* 74*  CALCIUM 8.7 9.1  CREATININE 1.86* 2.38*     Results for orders placed during the hospital encounter of 01/07/12 (from the past 24 hour(s))  CBC WITH DIFFERENTIAL     Status: Abnormal   Collection Time   01/07/12  5:40 PM      Component Value Range   WBC 14.4 (*) 4.0 - 10.5 K/uL   RBC 3.47 (*) 3.87 - 5.11 MIL/uL   Hemoglobin 10.2 (*) 12.0 - 15.0 g/dL   HCT 46.9 (*) 62.9 - 52.8 %   MCV 89.3  78.0 - 100.0 fL   MCH 29.4   26.0 - 34.0 pg   MCHC 32.9  30.0 - 36.0 g/dL   RDW 41.3  24.4 - 01.0 %   Platelets 311  150 - 400 K/uL   Neutrophils Relative 77  43 - 77 %   Neutro Abs 11.1 (*) 1.7 - 7.7 K/uL   Lymphocytes Relative 10 (*) 12 - 46 %   Lymphs Abs 1.4  0.7 - 4.0 K/uL   Monocytes Relative 9  3 - 12 %   Monocytes Absolute 1.3 (*) 0.1 - 1.0 K/uL   Eosinophils Relative 4  0 - 5 %  Eosinophils Absolute 0.6  0.0 - 0.7 K/uL   Basophils Relative 0  0 - 1 %   Basophils Absolute 0.0  0.0 - 0.1 K/uL  BASIC METABOLIC PANEL     Status: Abnormal   Collection Time   01/07/12  5:40 PM      Component Value Range   Sodium 134 (*) 135 - 145 mEq/L   Potassium 4.6  3.5 - 5.1 mEq/L   Chloride 95 (*) 96 - 112 mEq/L   CO2 24  19 - 32 mEq/L   Glucose, Bld 111 (*) 70 - 99 mg/dL   BUN 74 (*) 6 - 23 mg/dL   Creatinine, Ser 1.19 (*) 0.50 - 1.10 mg/dL   Calcium 9.1  8.4 - 14.7 mg/dL   GFR calc non Af Amer 20 (*) >90 mL/min   GFR calc Af Amer 23 (*) >90 mL/min  TROPONIN I     Status: Normal   Collection Time   01/07/12  8:08 PM      Component Value Range   Troponin I <0.30  <0.30 ng/mL  MAGNESIUM     Status: Normal   Collection Time   01/07/12  8:10 PM      Component Value Range   Magnesium 2.4  1.5 - 2.5 mg/dL  LACTIC ACID, PLASMA     Status: Normal   Collection Time   01/07/12  8:10 PM      Component Value Range   Lactic Acid, Venous 1.5  0.5 - 2.2 mmol/L  URINALYSIS, ROUTINE W REFLEX MICROSCOPIC     Status: Abnormal   Collection Time   01/07/12  9:18 PM      Component Value Range   Color, Urine YELLOW  YELLOW   APPearance TURBID (*) CLEAR   Specific Gravity, Urine 1.013  1.005 - 1.030   pH 8.0  5.0 - 8.0   Glucose, UA NEGATIVE  NEGATIVE mg/dL   Hgb urine dipstick LARGE (*) NEGATIVE   Bilirubin Urine NEGATIVE  NEGATIVE   Ketones, ur NEGATIVE  NEGATIVE mg/dL   Protein, ur 829 (*) NEGATIVE mg/dL   Urobilinogen, UA 0.2  0.0 - 1.0 mg/dL   Nitrite NEGATIVE  NEGATIVE   Leukocytes, UA LARGE (*) NEGATIVE   SODIUM, URINE, RANDOM     Status: Normal   Collection Time   01/07/12  9:18 PM      Component Value Range   Sodium, Ur 59    CREATININE, URINE, RANDOM     Status: Normal   Collection Time   01/07/12  9:18 PM      Component Value Range   Creatinine, Urine 52.80    URINE MICROSCOPIC-ADD ON     Status: Abnormal   Collection Time   01/07/12  9:18 PM      Component Value Range   Squamous Epithelial / LPF RARE  RARE   WBC, UA TOO NUMEROUS TO COUNT  <3 WBC/hpf   RBC / HPF 7-10  <3 RBC/hpf   Bacteria, UA MANY (*) RARE  BASIC METABOLIC PANEL     Status: Abnormal   Collection Time   01/08/12  6:00 AM      Component Value Range   Sodium 138  135 - 145 mEq/L   Potassium 4.0  3.5 - 5.1 mEq/L   Chloride 103  96 - 112 mEq/L   CO2 21  19 - 32 mEq/L   Glucose, Bld 111 (*) 70 - 99 mg/dL   BUN 60 (*) 6 - 23  mg/dL   Creatinine, Ser 8.29 (*) 0.50 - 1.10 mg/dL   Calcium 8.7  8.4 - 56.2 mg/dL   GFR calc non Af Amer 27 (*) >90 mL/min   GFR calc Af Amer 31 (*) >90 mL/min  CBC     Status: Abnormal   Collection Time   01/08/12  6:00 AM      Component Value Range   WBC 12.3 (*) 4.0 - 10.5 K/uL   RBC 3.10 (*) 3.87 - 5.11 MIL/uL   Hemoglobin 9.3 (*) 12.0 - 15.0 g/dL   HCT 13.0 (*) 86.5 - 78.4 %   MCV 88.7  78.0 - 100.0 fL   MCH 30.0  26.0 - 34.0 pg   MCHC 33.8  30.0 - 36.0 g/dL   RDW 69.6  29.5 - 28.4 %   Platelets 316  150 - 400 K/uL  GLUCOSE, CAPILLARY     Status: Abnormal   Collection Time   01/08/12 10:06 AM      Component Value Range   Glucose-Capillary 148 (*) 70 - 99 mg/dL   Recent Results (from the past 240 hour(s))  URINE CULTURE     Status: Normal   Collection Time   01/06/12 12:24 PM      Component Value Range Status Comment   Colony Count >=100,000 COLONIES/ML   Final    Organism ID, Bacteria Multiple bacterial morphotypes present, none   Final    Organism ID, Bacteria predominant. Suggest appropriate recollection if    Final    Organism ID, Bacteria clinically indicated.    Final     Renal Function:  Basename 01/08/12 0600 01/07/12 1740  CREATININE 1.86* 2.38*   Estimated Creatinine Clearance: 25.4 ml/min (by C-G formula based on Cr of 1.86).  Radiologic Imaging: Dg Chest 2 View  01/07/2012  *RADIOLOGY REPORT*  Clinical Data: Short of breath.  Fever.  CHEST - 2 VIEW  Comparison: 05/02/2011.  Findings: Low volume chest.  Low volumes accentuate the cardiopericardial silhouette which may be mildly enlarged. Pulmonary vascular congestion is suspected.  No focal consolidations.  No effusion.  IMPRESSION: Low volume chest.  Possible mild CHF.  Consider repeat PA and lateral with full inspiration when patient condition permits.   Original Report Authenticated By: Andreas Newport, M.D.    US Renal  01/07/2012  *RADIOLOGY REPORT*  Clinical Data: Urinary tract infection.  Chronic hydronephrosis. Neurogenic bladder.  RENAL/URINARY TRACT ULTRASOUND COMPLETE  Comparison:  CT 12/11/2011.  Findings:  Right Kidney:  13.6 cm.  Chronic severe hydronephrosis.  No pyonephrosis is identified.  Allowing for differences in technique, the renal pelvis appears similar to the prior examination.  Left Kidney:  13.2 cm.  Chronic severe hydronephrosis.  No pyonephrosis is identified.  Allowing for differences in technique, the renal pelvis appears similar to the prior examination.  Bladder:  Poorly visualized.  IMPRESSION: Chronic bilateral hydronephrosis.  Allowing for differences in technique, no interval change from 12/11/2011.  No convincing evidence of pyonephrosis of the dilated collecting system.   Original Report Authenticated By: Andreas Newport, M.D.    Dg Foot Complete Left  01/07/2012  *RADIOLOGY REPORT*  Clinical Data: Short of breath.  Fever.  Urinary tract infection. Osteomyelitis.  LEFT FOOT - COMPLETE 3+ VIEW  Comparison: None.  Findings: No acute osseous abnormality.  Plantar flexion of the toes.  First MTP joint osteoarthritis is moderate to severe.  No osteolysis.  Achilles  insertional of Achilles enthesopathy is present.  Midfoot appears within normal limits.  IMPRESSION: No  acute osseous abnormality.   Original Report Authenticated By: Andreas Newport, M.D.     I independently reviewed the above imaging studies.  Impression/Assessment:  1. Bilateral hydronephrosis. This was asymptomatic at the time of diagnosis with a CT scan recently. Although she had hydronephrosis, there was transit of contrast from the kidney to the ureters. This is most likely due to a noncompliant bladder  2. Recurrent urinary tract infections. This is probably the cause for her hospitalization, and possibly is the cause of her elevated creatinine which is improving  3. Renal insufficiency-baseline creatinine 0.6, 2.38 on admission. This is most likely multifactorial-perhaps contrast nephropathy, hydronephrosis and UTI  4. Thickwalled bladder, probably noncompliant. More than likely, recurrent urinary tract infection/cystitis are contributing to this. This is most likely the cause for the patient's hydronephrosis. r  Plan:  1. I think at worthwhile to provide adequate, regular drainage of the patient's bladder. This will be done with an in and out catheterizations 4 times a day  2. It might be worthwhile to teach the patient's family how to do this-I think this may well improve the bladder and the renal drainage  3. Continue antibiotics, tailored to appropriate organism once it cultures out  4. If her creatinine does not improve significantly, we might need to consider percutaneous nephrostomy tubes  5. I spoke with the patient and her family members during this consultation. 30 minutes spent in direct face-to-face with the family.

## 2012-01-08 NOTE — Progress Notes (Signed)
Utilization review completed.  

## 2012-01-08 NOTE — Progress Notes (Signed)
Quick Note:  I left voice message with results. ______ 

## 2012-01-08 NOTE — H&P (Addendum)
Katherine Ware is an 71 y.o. female.   Patient was seen and examined on January 07, 2012. PCP - Dr. Gershon Crane. Chief Complaint: Dysuria and difficulty urinating with fever and chills. HPI: 71 year-old female with history of progressive supranuclear palsy, hypertension was brought to the ER because patient has been experiencing subjective feeling of fever and chills over last 2-3 days. In addition patient has been having dysuria or difficulty urinating. Patient's dysuria and urinary difficulties has been going on for last few weeks and had followed with urologist and had CT abdomen done last month which showed bilateral hydronephrosis with hydroureter and felt to be having bladder outlet obstruction versus neurogenic bladder. Patient's symptoms again started happening 2 days ago and at that time patient's family care physician has placed patient on Bactrim despite which patient still has symptoms had completely ER. In the ER patient was found to be afebrile though family had noticed fever at her house with temperatures running around 103F. Patient in addition has been becoming progressively weaker in bed bound over the last 2 weeks and also had a fall last week. Patient also developed a heel ulcer.  Past Medical History  Diagnosis Date  . Hyperlipidemia   . Hypertension   . Asthma   . Hypokalemia   . Osteoporosis   . Osteoarthritis   . 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 Pearlean Brownie  . Parkinsonian syndrome     saw Darrol Angel NP   . Progressive supranuclear palsy     sees Dr. Dan Humphreys at [facility]   . Neurogenic bladder     sees Dr. Retta Diones   . Frequent UTI     Past Surgical History  Procedure Date  . Cholecystectomy   . Breast surgery     benign left breast cyst  . Knee surgery     left knee  . Carpal tunnel release     bilateral  . Epidural steroid     to lumbar spine 8-11 and 9-11  . Esophageal dilation     per Dr. Yancey Flemings   . Colonscopy 2010    per Dr. Marina Goodell,  benign polyps, repeat 3 years   . Esophageal dilation     dr  Marina Goodell   . Tubal ligation   . Benign  right breat     Family History  Problem Relation Age of Onset  . Cancer Mother   . Diabetes Mother   . Arthritis Father   . Heart disease Father   . Hypertension Father   . Stroke Father   . Diabetes Sister   . Diabetes Maternal Grandmother    Social History:  reports that she has never smoked. She has never used smokeless tobacco. She reports that she does not drink alcohol or use illicit drugs.  Allergies:  Allergies  Allergen Reactions  . Sulfa Antibiotics     unknown    Medications Prior to Admission  Medication Sig Dispense Refill  . acetaminophen (TYLENOL) 500 MG tablet Take 500 mg by mouth every 6 (six) hours as needed.      Marland Kitchen aspirin 81 MG tablet Take 81 mg by mouth daily.      Marland Kitchen atorvastatin (LIPITOR) 20 MG tablet Take 1 tablet (20 mg total) by mouth daily.  90 tablet  3  . Calcium Carb-Vit D-Soy Isoflav 600-200-25 MG-UNIT-MG TABS daily  30 each  0  . carbidopa-levodopa (SINEMET IR) 25-100 MG per tablet Take 1 tablet by mouth 3 (three)  times daily.  270 tablet  3  . Cholecalciferol (VITAMIN D) 2000 UNITS CAPS Take by mouth.        . clopidogrel (PLAVIX) 75 MG tablet Take 1 tablet (75 mg total) by mouth daily.  90 tablet  3  . diclofenac sodium (VOLTAREN) 1 % GEL Apply 2 g topically 2 (two) times daily.  400 g  3  . docusate sodium (COLACE) 100 MG capsule Take 100 mg by mouth daily.      Marland Kitchen doxycycline (VIBRAMYCIN) 100 MG capsule Take 100 mg by mouth daily.      . hydrochlorothiazide (HYDRODIURIL) 25 MG tablet Take 1 tablet (25 mg total) by mouth daily.  90 tablet  3  . HYDROcodone-acetaminophen (NORCO/VICODIN) 5-325 MG per tablet Take 1 tablet by mouth every 4 (four) hours as needed for pain.  540 tablet  1  . ibuprofen (ADVIL,MOTRIN) 200 MG tablet Take 200 mg by mouth every 6 (six) hours as needed. For pain      . lisinopril (PRINIVIL,ZESTRIL) 20 MG tablet Take 1  tablet (20 mg total) by mouth daily.  90 tablet  3  . metoprolol succinate (TOPROL-XL) 25 MG 24 hr tablet Take 1 tablet (25 mg total) by mouth daily.  90 tablet  3  . Multiple Vitamin (MULTIVITAMIN) tablet Take 1 tablet by mouth daily.      Marland Kitchen omeprazole (PRILOSEC) 20 MG capsule Take 20 mg by mouth daily.      Marland Kitchen oxybutynin (DITROPAN) 5 MG tablet Take 1 tablet (5 mg total) by mouth 2 (two) times daily.  180 tablet  3  . sulfamethoxazole-trimethoprim (BACTRIM DS,SEPTRA DS) 800-160 MG per tablet Take 1 tablet by mouth 2 (two) times daily.  20 tablet  0  . venlafaxine (EFFEXOR) 37.5 MG tablet Take 1 tablet (37.5 mg total) by mouth daily.  90 tablet  3  . omeprazole (PRILOSEC) 20 MG capsule Take 20 mg by mouth daily.          Results for orders placed during the hospital encounter of 01/07/12 (from the past 48 hour(s))  CBC WITH DIFFERENTIAL     Status: Abnormal   Collection Time   01/07/12  5:40 PM      Component Value Range Comment   WBC 14.4 (*) 4.0 - 10.5 K/uL    RBC 3.47 (*) 3.87 - 5.11 MIL/uL    Hemoglobin 10.2 (*) 12.0 - 15.0 g/dL    HCT 16.1 (*) 09.6 - 46.0 %    MCV 89.3  78.0 - 100.0 fL    MCH 29.4  26.0 - 34.0 pg    MCHC 32.9  30.0 - 36.0 g/dL    RDW 04.5  40.9 - 81.1 %    Platelets 311  150 - 400 K/uL    Neutrophils Relative 77  43 - 77 %    Neutro Abs 11.1 (*) 1.7 - 7.7 K/uL    Lymphocytes Relative 10 (*) 12 - 46 %    Lymphs Abs 1.4  0.7 - 4.0 K/uL    Monocytes Relative 9  3 - 12 %    Monocytes Absolute 1.3 (*) 0.1 - 1.0 K/uL    Eosinophils Relative 4  0 - 5 %    Eosinophils Absolute 0.6  0.0 - 0.7 K/uL    Basophils Relative 0  0 - 1 %    Basophils Absolute 0.0  0.0 - 0.1 K/uL   BASIC METABOLIC PANEL     Status: Abnormal   Collection Time  01/07/12  5:40 PM      Component Value Range Comment   Sodium 134 (*) 135 - 145 mEq/L    Potassium 4.6  3.5 - 5.1 mEq/L    Chloride 95 (*) 96 - 112 mEq/L    CO2 24  19 - 32 mEq/L    Glucose, Bld 111 (*) 70 - 99 mg/dL    BUN 74 (*)  6 - 23 mg/dL    Creatinine, Ser 4.09 (*) 0.50 - 1.10 mg/dL    Calcium 9.1  8.4 - 81.1 mg/dL    GFR calc non Af Amer 20 (*) >90 mL/min    GFR calc Af Amer 23 (*) >90 mL/min   TROPONIN I     Status: Normal   Collection Time   01/07/12  8:08 PM      Component Value Range Comment   Troponin I <0.30  <0.30 ng/mL   MAGNESIUM     Status: Normal   Collection Time   01/07/12  8:10 PM      Component Value Range Comment   Magnesium 2.4  1.5 - 2.5 mg/dL   LACTIC ACID, PLASMA     Status: Normal   Collection Time   01/07/12  8:10 PM      Component Value Range Comment   Lactic Acid, Venous 1.5  0.5 - 2.2 mmol/L   URINALYSIS, ROUTINE W REFLEX MICROSCOPIC     Status: Abnormal   Collection Time   01/07/12  9:18 PM      Component Value Range Comment   Color, Urine YELLOW  YELLOW    APPearance TURBID (*) CLEAR    Specific Gravity, Urine 1.013  1.005 - 1.030    pH 8.0  5.0 - 8.0    Glucose, UA NEGATIVE  NEGATIVE mg/dL    Hgb urine dipstick LARGE (*) NEGATIVE    Bilirubin Urine NEGATIVE  NEGATIVE    Ketones, ur NEGATIVE  NEGATIVE mg/dL    Protein, ur 914 (*) NEGATIVE mg/dL    Urobilinogen, UA 0.2  0.0 - 1.0 mg/dL    Nitrite NEGATIVE  NEGATIVE    Leukocytes, UA LARGE (*) NEGATIVE   SODIUM, URINE, RANDOM     Status: Normal   Collection Time   01/07/12  9:18 PM      Component Value Range Comment   Sodium, Ur 59     CREATININE, URINE, RANDOM     Status: Normal   Collection Time   01/07/12  9:18 PM      Component Value Range Comment   Creatinine, Urine 52.80     URINE MICROSCOPIC-ADD ON     Status: Abnormal   Collection Time   01/07/12  9:18 PM      Component Value Range Comment   Squamous Epithelial / LPF RARE  RARE    WBC, UA TOO NUMEROUS TO COUNT  <3 WBC/hpf    RBC / HPF 7-10  <3 RBC/hpf    Bacteria, UA MANY (*) RARE    Dg Chest 2 View  01/07/2012  *RADIOLOGY REPORT*  Clinical Data: Short of breath.  Fever.  CHEST - 2 VIEW  Comparison: 05/02/2011.  Findings: Low volume chest.  Low  volumes accentuate the cardiopericardial silhouette which may be mildly enlarged. Pulmonary vascular congestion is suspected.  No focal consolidations.  No effusion.  IMPRESSION: Low volume chest.  Possible mild CHF.  Consider repeat PA and lateral with full inspiration when patient condition permits.   Original Report Authenticated By: Andreas Newport,  M.D.    US Renal  01/07/2012  *RADIOLOGY REPORT*  Clinical Data: Urinary tract infection.  Chronic hydronephrosis. Neurogenic bladder.  RENAL/URINARY TRACT ULTRASOUND COMPLETE  Comparison:  CT 12/11/2011.  Findings:  Right Kidney:  13.6 cm.  Chronic severe hydronephrosis.  No pyonephrosis is identified.  Allowing for differences in technique, the renal pelvis appears similar to the prior examination.  Left Kidney:  13.2 cm.  Chronic severe hydronephrosis.  No pyonephrosis is identified.  Allowing for differences in technique, the renal pelvis appears similar to the prior examination.  Bladder:  Poorly visualized.  IMPRESSION: Chronic bilateral hydronephrosis.  Allowing for differences in technique, no interval change from 12/11/2011.  No convincing evidence of pyonephrosis of the dilated collecting system.   Original Report Authenticated By: Andreas Newport, M.D.    Dg Foot Complete Left  01/07/2012  *RADIOLOGY REPORT*  Clinical Data: Short of breath.  Fever.  Urinary tract infection. Osteomyelitis.  LEFT FOOT - COMPLETE 3+ VIEW  Comparison: None.  Findings: No acute osseous abnormality.  Plantar flexion of the toes.  First MTP joint osteoarthritis is moderate to severe.  No osteolysis.  Achilles insertional of Achilles enthesopathy is present.  Midfoot appears within normal limits.  IMPRESSION: No acute osseous abnormality.   Original Report Authenticated By: Andreas Newport, M.D.     ROS  Blood pressure 133/68, pulse 114, temperature 99.9 F (37.7 C), temperature source Oral, resp. rate 22, height 4' 9.48" (1.46 m), weight 81.4 kg (179 lb 7.3 oz), SpO2  95.00%. Physical Exam   Assessment/Plan #1. Complicated UTI - at this time Dr. Isabel Caprice was consulted by the ER physician. They have requested Foley catheter placement. Get blood cultures and urine cultures. Patient has received one dose of ceftriaxone and given the complicated nature of patient's possible UTI patient will be placed on Permaxin and will add also vancomycin until cultures are available and also patient has a heel ulcer. Renal ultrasound as been ordered. #2. Progressive weakness with history of progressive supranuclear palsy - neurology has been consulted. We'll follow their recommendations. #3. Acute renal failure - probably secondary to dehydration poor oral intake and patient is also on Bactrim, lisinopril, diuretics and NSAIDs which could all have further worsened her renal function. At this time the above mentioned medications are on hold and we will gently hydrate patient with close followup of metabolic panel and intake output. #4. Anemia - closely follow CBC. #5. Left heel ulcer - wound consult requested. #6. Hypertension - continue home medications except for lisinopril. When necessary IV hydralazine for systolic blood pressure more than 160.  CODE STATUS - DO NOT RESUSCITATE as discussed with patient and patient's husband and son at the bedside.  KAKRAKANDY,ARSHAD N. 01/08/2012, 1:48 AM Addendum - after the Foley was placed patient started having severe pain and also has had frank hematuria with passing clots. I have discussed with on-call urologist Dr. Isabel Caprice. Dr. Orvan July and wished to discontinue the Foley and they will be seeing patient in consult. Follow CBC and metabolic panel closely. Check bladder scan for any urinary retention. I have decreased IV fluids at this time.  Midge Minium

## 2012-01-08 NOTE — Consult Note (Addendum)
WOC consult Note Reason for Consult: Consult requested for left heel pressure ulcer.  Family at bedside states she had the left heel wound prior to admission.  Upon assessment, one was also found to right outer heel.  Family states this was not there a few days ago when she was assessed while bathing, but area is smaller in size and easier to miss; appearance is consistent with both areas occurring at the same time frame and location of right wound is difficult to assess unless turning and lifting foot. Pt has only been in hospital 1 day and appearance of both DTI is consistent with wounds which developed several days ago. Wound type: Left and right heels with deep tissue injuries. Pressure Ulcer POA: Yes Measurement:  Left heel 7X3cm, dark purple.  No open wounds or drainage.  Right heel .8X.3 dark purple.  No open wounds or drainage. Dressing procedure/placement/frequency: Prevalon boots to decrease pressure to BLE.  These wounds are high risk to evolve into full thickness wounds.  Discussed etiology, treatment, and preventive measures with family and caregiver at bedside. Educational handout on pressure ulcers provided.   Cammie Mcgee, RN, MSN, [employer]  [phone]

## 2012-01-08 NOTE — Progress Notes (Signed)
[facility] PROGRESS NOTE  Katherine Ware NFA:[id number] DOB: [birth year] DOA: 01/07/2012 PCP: Nelwyn Salisbury, MD  Assessment/Plan: #1. Complicated UTI - At this time Dr. Isabel Caprice was consulted by the ER physician. They have requested Foley catheter placement.  -  Blood cultures and urine cultures obtained in the ED and currently pending. -  Urine culture/sensitivities pending as such will continue broad spectrum antibiotics.  -  Renal ultrasound shows chronic BL hydronephrosis and also reports No convincing evidence of pyonephrosis of the dilated collecting system.  #2. Progressive weakness with history of progressive supranuclear palsy - neurology has been at this juncture once mentation improves and patient can cooperate with better exam will plan on re consulting neurology as per their request.   #3. Acute renal failure - probably secondary to dehydration poor oral intake and patient is also on Bactrim, lisinopril, diuretics and NSAIDs which could all have further worsened her renal function. At this time the above mentioned medications are on hold and we will gently hydrate patient with close followup of metabolic panel and intake output.  Has had 600 cc of urine output documented from admission will continue to monitor for further values yet to be documented. - Improved with rehydration and and cessation of above listed medications  #4. Anemia - closely follow CBC.   #5. Left heel ulcer - wound consult requested. Prevalon boots to decrease pressure to BLE  #6. Hypertension - continue home medications except for lisinopril. Geven soft blood pressures will plan on decreasing metoprolol. When necessary IV hydralazine for systolic blood pressure more than 160.   Code Status: DNR Family Communication: spoke with grand daughter at bedside Disposition Plan: Pending clinical improvement.   Consultants:  Urology  Neurology contacted will plan on having them reevaluate patient once her  mentation improves.  Procedures:  Renal ultrasound  Chest xray  Antibiotics:  Primaxin  Vancomycin  HPI/Subjective: Patient continue to be somnolent today but arousable.  Nursing reports patient is alert and Oriented when she is awake and alert.  No acute issues reported overnight.  Objective: Filed Vitals:   01/08/12 0033 01/08/12 0506 01/08/12 0959 01/08/12 1358  BP: 133/68 110/65 104/58 99/54  Pulse: 114 103 97 104  Temp: 99.9 F (37.7 C) 99.9 F (37.7 C) 98.2 F (36.8 C) 98.5 F (36.9 C)  TempSrc: Oral Oral Oral Oral  Resp: 22 20 18 18   Height: 4' 9.48" (1.46 m)     Weight:      SpO2: 95% 90% 93% 94%    Intake/Output Summary (Last 24 hours) at 01/08/12 1455 Last data filed at 01/08/12 0335  Gross per 24 hour  Intake      0 ml  Output    600 ml  Net   -600 ml   Filed Weights   01/08/12 0017  Weight: 81.4 kg (179 lb 7.3 oz)    Exam:   General:  Pt in NAD, somnolent but arrousable   Cardiovascular: RRR, no murmurs  Respiratory: no wheezes, no increased work of breathing, breath sounds heard BL  Abdomen: soft, NT, ND  Data Reviewed: Basic Metabolic Panel:  Lab 01/08/12 4696 01/07/12 2010 01/07/12 1740  NA 138 -- 134*  K 4.0 -- 4.6  CL 103 -- 95*  CO2 21 -- 24  GLUCOSE 111* -- 111*  BUN 60* -- 74*  CREATININE 1.86* -- 2.38*  CALCIUM 8.7 -- 9.1  MG -- 2.4 --  PHOS -- -- --   Liver Function Tests: No results found  for this basename: AST:5,ALT:5,ALKPHOS:5,BILITOT:5,PROT:5,ALBUMIN:5 in the last 168 hours No results found for this basename: LIPASE:5,AMYLASE:5 in the last 168 hours No results found for this basename: AMMONIA:5 in the last 168 hours CBC:  Lab 01/08/12 0600 01/07/12 1740  WBC 12.3* 14.4*  NEUTROABS -- 11.1*  HGB 9.3* 10.2*  HCT 27.5* 31.0*  MCV 88.7 89.3  PLT 316 311   Cardiac Enzymes:  Lab 01/07/12 2008  CKTOTAL --  CKMB --  CKMBINDEX --  TROPONINI <0.30   BNP (last 3 results) No results found for this basename:  PROBNP:3 in the last 8760 hours CBG:  Lab 01/08/12 1006  GLUCAP 148*    Recent Results (from the past 240 hour(s))  URINE CULTURE     Status: Normal   Collection Time   01/06/12 12:24 PM      Component Value Range Status Comment   Colony Count >=100,000 COLONIES/ML   Final    Organism ID, Bacteria Multiple bacterial morphotypes present, none   Final    Organism ID, Bacteria predominant. Suggest appropriate recollection if    Final    Organism ID, Bacteria clinically indicated.   Final      Studies: Dg Chest 2 View  01/07/2012  *RADIOLOGY REPORT*  Clinical Data: Short of breath.  Fever.  CHEST - 2 VIEW  Comparison: 05/02/2011.  Findings: Low volume chest.  Low volumes accentuate the cardiopericardial silhouette which may be mildly enlarged. Pulmonary vascular congestion is suspected.  No focal consolidations.  No effusion.  IMPRESSION: Low volume chest.  Possible mild CHF.  Consider repeat PA and lateral with full inspiration when patient condition permits.   Original Report Authenticated By: Andreas Newport, M.D.    US Renal  01/07/2012  *RADIOLOGY REPORT*  Clinical Data: Urinary tract infection.  Chronic hydronephrosis. Neurogenic bladder.  RENAL/URINARY TRACT ULTRASOUND COMPLETE  Comparison:  CT 12/11/2011.  Findings:  Right Kidney:  13.6 cm.  Chronic severe hydronephrosis.  No pyonephrosis is identified.  Allowing for differences in technique, the renal pelvis appears similar to the prior examination.  Left Kidney:  13.2 cm.  Chronic severe hydronephrosis.  No pyonephrosis is identified.  Allowing for differences in technique, the renal pelvis appears similar to the prior examination.  Bladder:  Poorly visualized.  IMPRESSION: Chronic bilateral hydronephrosis.  Allowing for differences in technique, no interval change from 12/11/2011.  No convincing evidence of pyonephrosis of the dilated collecting system.   Original Report Authenticated By: Andreas Newport, M.D.    Dg Foot Complete  Left  01/07/2012  *RADIOLOGY REPORT*  Clinical Data: Short of breath.  Fever.  Urinary tract infection. Osteomyelitis.  LEFT FOOT - COMPLETE 3+ VIEW  Comparison: None.  Findings: No acute osseous abnormality.  Plantar flexion of the toes.  First MTP joint osteoarthritis is moderate to severe.  No osteolysis.  Achilles insertional of Achilles enthesopathy is present.  Midfoot appears within normal limits.  IMPRESSION: No acute osseous abnormality.   Original Report Authenticated By: Andreas Newport, M.D.     Scheduled Meds:   . atorvastatin  20 mg Oral Daily  . carbidopa-levodopa  1 tablet Oral TID WC  . docusate sodium  100 mg Oral Daily  . feeding supplement  237 mL Oral BID BM  . feeding supplement  1 Container Oral TID BM  . imipenem-cilastatin  250 mg Intravenous Q6H  . metoprolol succinate  25 mg Oral Daily  . multivitamin with minerals  1 tablet Oral Daily  . oxybutynin  5 mg Oral BID  .  pantoprazole  40 mg Oral Daily  . sodium chloride  3 mL Intravenous Q12H  . vancomycin  750 mg Intravenous Q24H  . venlafaxine  37.5 mg Oral Q breakfast   Continuous Infusions:   . sodium chloride 75 mL/hr at 01/08/12 1191    Principal Problem:  *Complicated UTI (urinary tract infection) Active Problems:  Supranuclear palsies, progressive  Lower extremity weakness  ARF (acute renal failure)  Heel ulcer    Time spent: > 40 minutes    Penny Pia  [facility] Pager [phone]. If 8PM-8AM, please contact night-coverage at [email or web address], password [facility] 01/08/2012, 2:55 PM  LOS: 1 day

## 2012-01-08 NOTE — Progress Notes (Signed)
ANTIBIOTIC CONSULT NOTE - Follow-up  Pharmacy Consult for vancomycin, Primaxin Indication: Complicated UTI and decubitus ulcer  Allergies  Allergen Reactions  . Sulfa Antibiotics     unknown    Patient Measurements: Height: 4' 9.48" (146 cm) Weight: 179 lb 7.3 oz (81.4 kg) IBW/kg (Calculated) : 39.7   Vital Signs: Temp: 99.9 F (37.7 C) (12/13 0506) Temp src: Oral (12/13 0506) BP: 110/65 mmHg (12/13 0506) Pulse Rate: 103  (12/13 0506) Intake/Output from previous day: 12/12 0701 - 12/13 0700 In: -  Out: 600 [Urine:600] Intake/Output from this shift:    Labs:  [facility] 01/08/12 0600 01/07/12 2118 01/07/12 1740  WBC 12.3* -- 14.4*  HGB 9.3* -- 10.2*  PLT 316 -- 311  LABCREA -- 52.80 --  CREATININE 1.86* -- 2.38*   Estimated Creatinine Clearance: 25.4 ml/min (by C-G formula based on Cr of 1.86). No results found for this basename: VANCOTROUGH:2,VANCOPEAK:2,VANCORANDOM:2,GENTTROUGH:2,GENTPEAK:2,GENTRANDOM:2,TOBRATROUGH:2,TOBRAPEAK:2,TOBRARND:2,AMIKACINPEAK:2,AMIKACINTROU:2,AMIKACIN:2, in the last 72 hours   Microbiology: Recent Results (from the past 720 hour(s))  URINE CULTURE     Status: Normal   Collection Time   01/06/12 12:24 PM      Component Value Range Status Comment   Colony Count >=100,000 COLONIES/ML   Final    Organism ID, Bacteria Multiple bacterial morphotypes present, none   Final    Organism ID, Bacteria predominant. Suggest appropriate recollection if    Final    Organism ID, Bacteria clinically indicated.   Final    Assessment: 71 yo female who presented with possible UTI. Pharmacy to manage vancomycin and Primaxin for complicated UTI and decubitus ulcer. Of note, renal function has improved from admission so doses need to be adjusted  Goal of Therapy:  Vancomycin trough level 10-15 mcg/ml  Plan:  1. Change vancomycin to 750mg  IV Q24H 2. Change primaxin 250mg  IV Q6H 3. F/u renal fxn, C&S, clinical status and trough at [facility]  Katherine Ware, Drake Leach 01/08/2012,9:45 AM

## 2012-01-08 NOTE — Progress Notes (Signed)
INITIAL NUTRITION ASSESSMENT  DOCUMENTATION CODES Per approved criteria  -Obesity Unspecified   INTERVENTION: 1. Down-grade to Dysphagia 2 diet 2. Recommend SLP BSE, discussed with Dr. Cena Benton (family reports pt pockets foods and coughs with thin liquids.) 3. Ensure Complete po BID, each supplement provides 350 kcal and 13 grams of protein. 4. Ensure Pudding po TID, each supplement provides 170 kcal and 4 grams of protein. 5. MVI daily 6. RD to continue to follow nutrition care plan  NUTRITION DIAGNOSIS: Inadequate oral intake related to acute illness as evidenced by family and caregiver report.   Goal: Pt to meet >/= 90% of their estimated nutrition needs.  Monitor:  weight trends, lab trends, I/O's, PO intake, supplement tolerance  Reason for Assessment: Low Braden (Wounds)  71 y.o. female  Admitting Dx: Complicated UTI (urinary tract infection)  ASSESSMENT: Dysuria and urinary difficulties x a few weeks. CT of abdomen done last month which showed bilateral hydronephrosis with hydroureter and felt to be having bladder outlet obstruction versus neurogenic bladder. Pt with worsening symptoms x 2-3 days. Per chart, pt has been becoming progressively weaker and bed bound over the past few weeks.  Discussed nutritional intake with daughter and caregiver at bedside. They report that pt has been having poor intake while febrile. Will drink Ensure when not eating well. Discussed that her foods should be ground as she cannot tolerate regular-consistency foods. They also note that she coughs frequently when drinking and pockets foods. Family interested in having SLP evaluate swallowing. This RD discussed with Dr. Cena Benton.  Height: Ht Readings from Last 1 Encounters:  01/08/12 4' 9.48" (1.46 m)   Weight: Wt Readings from Last 1 Encounters:  01/08/12 179 lb 7.3 oz (81.4 kg)   Ideal Body Weight: 95 lb/43.2 kg  % Ideal Body Weight: 111%  Wt Readings from Last 10 Encounters:  01/08/12 179  lb 7.3 oz (81.4 kg)  07/08/11 170 lb (77.111 kg)  03/25/11 195 lb (88.451 kg)  02/03/11 194 lb (87.998 kg)  01/28/11 196 lb (88.905 kg)  07/28/10 200 lb (90.719 kg)  05/26/10 212 lb (96.163 kg)  03/26/10 215 lb (97.523 kg)  04/01/09 222 lb 6.4 oz (100.88 kg)  09/13/08 213 lb (96.616 kg)   Usual Body Weight: 195 lb  % Usual Body Weight: 92%; 8% wt loss x 10 months (not significant)  BMI:  Body mass index is 38.19 kg/(m^2). Obese Class II  Estimated Nutritional Needs: Kcal: 1500 - 1600 kcal Protein: 80 - 95 grams Fluid: 1.6 - 1.8 liters daily  Skin: DTI to left heel  Diet Order: Cardiac  EDUCATION NEEDS: -No education needs identified at this time   Intake/Output Summary (Last 24 hours) at 01/08/12 1008 Last data filed at 01/08/12 0335  Gross per 24 hour  Intake      0 ml  Output    600 ml  Net   -600 ml   Last BM: 12/12  Labs:   Lab 01/08/12 0600 01/07/12 2010 01/07/12 1740  NA 138 -- 134*  K 4.0 -- 4.6  CL 103 -- 95*  CO2 21 -- 24  BUN 60* -- 74*  CREATININE 1.86* -- 2.38*  CALCIUM 8.7 -- 9.1  MG -- 2.4 --  PHOS -- -- --  GLUCOSE 111* -- 111*    CBG (last 3)  No results found for this basename: GLUCAP:3 in the last 72 hours  Scheduled Meds:   . atorvastatin  20 mg Oral Daily  . carbidopa-levodopa  1 tablet  Oral TID WC  . docusate sodium  100 mg Oral Daily  . imipenem-cilastatin  250 mg Intravenous Q6H  . metoprolol succinate  25 mg Oral Daily  . oxybutynin  5 mg Oral BID  . pantoprazole  40 mg Oral Daily  . sodium chloride  3 mL Intravenous Q12H  . vancomycin  750 mg Intravenous Q24H  . venlafaxine  37.5 mg Oral Q breakfast   Continuous Infusions:   . sodium chloride 75 mL/hr at 01/08/12 1610    Past Medical History  Diagnosis Date  . Hyperlipidemia   . Hypertension   . Asthma   . Hypokalemia   . Osteoporosis   . Osteoarthritis   . 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 Pearlean Brownie  . Parkinsonian syndrome     saw Darrol Angel NP   .  Progressive supranuclear palsy     sees Dr. Dan Humphreys at [facility]   . Neurogenic bladder     sees Dr. Retta Diones   . Frequent UTI     Past Surgical History  Procedure Date  . Cholecystectomy   . Breast surgery     benign left breast cyst  . Knee surgery     left knee  . Carpal tunnel release     bilateral  . Epidural steroid     to lumbar spine 8-11 and 9-11  . Esophageal dilation     per Dr. Yancey Flemings   . Colonscopy 2010    per Dr. Marina Goodell, benign polyps, repeat 3 years   . Esophageal dilation     dr  Marina Goodell   . Tubal ligation   . Benign  right breat    Jarold Motto MS, RD, LDN Pager: [phone] After-hours pager: [phone]

## 2012-01-08 NOTE — Progress Notes (Signed)
ANTIBIOTIC CONSULT NOTE - INITIAL  Pharmacy Consult for vancomycin, Primaxin Indication: Complicated UTI and decubitus ulcer  Allergies  Allergen Reactions  . Sulfa Antibiotics     unknown    Patient Measurements: Height: 4' 9.48" (146 cm) Weight: 179 lb 7.3 oz (81.4 kg) IBW/kg (Calculated) : 39.7   Vital Signs: Temp: 99.9 F (37.7 C) (12/13 0033) Temp src: Oral (12/13 0033) BP: 133/68 mmHg (12/13 0033) Pulse Rate: 114  (12/13 0033) Intake/Output from previous day:   Intake/Output from this shift:    Labs:  [facility] 01/07/12 2118 01/07/12 1740  WBC -- 14.4*  HGB -- 10.2*  PLT -- 311  LABCREA 52.80 --  CREATININE -- 2.38*   Estimated Creatinine Clearance: 19.9 ml/min (by C-G formula based on Cr of 2.38). No results found for this basename: VANCOTROUGH:2,VANCOPEAK:2,VANCORANDOM:2,GENTTROUGH:2,GENTPEAK:2,GENTRANDOM:2,TOBRATROUGH:2,TOBRAPEAK:2,TOBRARND:2,AMIKACINPEAK:2,AMIKACINTROU:2,AMIKACIN:2, in the last 72 hours   Microbiology: No results found for this or any previous visit (from the past 720 hour(s)).  Medical History: Past Medical History  Diagnosis Date  . Hyperlipidemia   . Hypertension   . Asthma   . Hypokalemia   . Osteoporosis   . Osteoarthritis   . 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 Pearlean Brownie  . Parkinsonian syndrome     saw Darrol Angel NP   . Progressive supranuclear palsy     sees Dr. Dan Humphreys at [facility]   . Neurogenic bladder     sees Dr. Retta Diones   . Frequent UTI     Medications:  Scheduled:    . aspirin EC  81 mg Oral Daily  . atorvastatin  20 mg Oral Daily  . carbidopa-levodopa  1 tablet Oral TID WC  . docusate sodium  100 mg Oral Daily  . metoprolol succinate  25 mg Oral Daily  . oxybutynin  5 mg Oral BID  . pantoprazole  40 mg Oral Daily  . sodium chloride  3 mL Intravenous Q12H  . venlafaxine  37.5 mg Oral Q breakfast   Assessment: 71 yo female who presented with possible UTI. Pharmacy to manage vancomycin and  Primaxin for complicated UTI and decubitus ulcer.   Goal of Therapy:  Vancomycin trough level 10-15 mcg/ml  Plan:  1. Vancomycin 1250mg  IV x 1, then 1000mg  Q48H. 2. Primaxin 250mg  IV Q12H  Thad Ranger, Mellody Drown 01/08/2012,12:56 AM

## 2012-01-09 LAB — CBC
HCT: 28.8 % — ABNORMAL LOW (ref 36.0–46.0)
Hemoglobin: 9.4 g/dL — ABNORMAL LOW (ref 12.0–15.0)
MCH: 29.5 pg (ref 26.0–34.0)
MCHC: 32.6 g/dL (ref 30.0–36.0)
RDW: 14.4 % (ref 11.5–15.5)

## 2012-01-09 LAB — URINE CULTURE: Colony Count: >=100000

## 2012-01-09 LAB — BASIC METABOLIC PANEL
BUN: 29 mg/dL — ABNORMAL HIGH (ref 6–23)
Calcium: 8.5 mg/dL (ref 8.4–10.5)
GFR calc Af Amer: 55 mL/min — ABNORMAL LOW (ref >90)
GFR calc non Af Amer: 47 mL/min — ABNORMAL LOW (ref >90)
Glucose, Bld: 151 mg/dL — ABNORMAL HIGH (ref 70–99)
Potassium: 3.7 mEq/L (ref 3.5–5.1)

## 2012-01-09 MED ORDER — VANCOMYCIN HCL 10 G IV SOLR
1500.0000 mg | INTRAVENOUS | Status: DC
  Administered 2012-01-10: 1500 mg via INTRAVENOUS
  Filled 2012-01-09: qty 1500

## 2012-01-09 MED ORDER — SODIUM CHLORIDE 0.9 % IV SOLN
500.0000 mg | Freq: Three times a day (TID) | INTRAVENOUS | Status: DC
  Administered 2012-01-09 – 2012-01-10 (×3): 500 mg via INTRAVENOUS
  Filled 2012-01-09 (×4): qty 500

## 2012-01-09 NOTE — Progress Notes (Signed)
ANTIBIOTIC CONSULT NOTE - Follow-up  Pharmacy Consult for vancomycin, Primaxin Indication: Complicated UTI and decubitus ulcer  Allergies  Allergen Reactions  . Sulfa Antibiotics     unknown    Patient Measurements: Height: 4' 9.48" (146 cm) Weight: 180 lb 12.4 oz (82 kg) IBW/kg (Calculated) : 39.7   Vital Signs: Temp: 99.2 F (37.3 C) (12/14 1316) Temp src: Oral (12/14 1316) BP: 122/81 mmHg (12/14 1316) Pulse Rate: 88  (12/14 1047) Intake/Output from previous day: 12/13 0701 - 12/14 0700 In: 300 [IV Piggyback:300] Out: 350 [Urine:350] Intake/Output from this shift:    Labs:  [facility] 01/09/12 0700 01/08/12 0600 01/07/12 2118 01/07/12 1740  WBC 12.1* 12.3* -- 14.4*  HGB 9.4* 9.3* -- 10.2*  PLT 366 316 -- 311  LABCREA -- -- 52.80 --  CREATININE 1.15* 1.86* -- 2.38*   Estimated Creatinine Clearance: 41.3 ml/min (by C-G formula based on Cr of 1.15). No results found for this basename: VANCOTROUGH:2,VANCOPEAK:2,VANCORANDOM:2,GENTTROUGH:2,GENTPEAK:2,GENTRANDOM:2,TOBRATROUGH:2,TOBRAPEAK:2,TOBRARND:2,AMIKACINPEAK:2,AMIKACINTROU:2,AMIKACIN:2, in the last 72 hours   Microbiology: Recent Results (from the past 720 hour(s))  URINE CULTURE     Status: Normal   Collection Time   01/06/12 12:24 PM      Component Value Range Status Comment   Colony Count >=100,000 COLONIES/ML   Final    Organism ID, Bacteria Multiple bacterial morphotypes present, none   Final    Organism ID, Bacteria predominant. Suggest appropriate recollection if    Final    Organism ID, Bacteria clinically indicated.   Final   URINE CULTURE     Status: Normal (Preliminary result)   Collection Time   01/07/12  9:18 PM      Component Value Range Status Comment   Specimen Description URINE, CATHETERIZED   Final    Special Requests NONE   Final    Culture  Setup Time 01/08/2012 00:35   Final    Colony Count >=100,000 COLONIES/ML   Final    Culture PROTEUS MIRABILIS   Final    Report Status PENDING    Incomplete    Assessment: 71 yo female who presented with possible UTI. Pharmacy to manage vancomycin and Primaxin for complicated UTI and decubitus ulcer. Of note, renal function has improved from admission so doses need to be adjusted.  Urine culture is growing Proteus mirabilis with sensitivities pending. Patient is afebrile and WBC are trending down.  Goal of Therapy:  Vancomycin trough level 10-15 mcg/ml  Plan:  1. Change vancomycin to 1500 mg IV q24h 2. Change Primaxin to 500 mg IV q8h 3. F/u renal fxn, C&S, clinical status, trough at steady state  [facility], [address].D., BCPS Clinical Pharmacist Pager: [phone] 01/09/2012 2:39 PM

## 2012-01-09 NOTE — Progress Notes (Signed)
[facility] PROGRESS NOTE  MINIE ROADCAP ZOX:[id number] DOB: 06/10/1940 DOA: 01/07/2012 PCP: Katherine Salisbury, MD  Assessment/Plan: #1. Complicated UTI - At this time Dr. Isabel Ware was consulted by the ER physician.  -  Blood cultures and urine cultures obtained in the ED and currently pending. -  Urine culture growing proteus mirabilis but sensitivities pending as such will continue broad spectrum antibiotics.  -  Renal ultrasound shows chronic BL hydronephrosis and also reports No convincing evidence of pyonephrosis of the dilated collecting system. - Urology on board and making recommendations.  Have discussed plan of intermittent catheterization 4 times per day.  They verbalize their understanding and agreement. - infectious etiology likely causing hematuria. Per urology's note patient has already had cystoscopy and will defer further work-up/recommendations to them.  #2. Progressive weakness with history of progressive supranuclear palsy - Neurology initially consulted.  At this point plan is for intermittent catheterization   #3. Acute renal failure - probably secondary to dehydration poor oral intake and patient is also on Bactrim, lisinopril, diuretics and NSAIDs which could all have further worsened her renal function.  - Improved with rehydration and and cessation of above listed medications.   - As listed above urology on board and plans for patient are for intermittent catheterization.  #4. Anemia - closely follow CBC and has slight improvement today despite hematuria.   #5. Left heel ulcer - wound consult requested. Prevalon boots to decrease pressure to BLE  #6. Hypertension - continue home medications except for lisinopril. Geven soft blood pressures will plan on decreasing metoprolol. When necessary IV hydralazine for systolic blood pressure more than 160.   Code Status: DNR Family Communication: spoke with grand daughter at bedside Disposition Plan: Pending clinical  improvement.   Consultants:  Urology  Neurology contacted will plan on having them reevaluate patient once her mentation improves.  Procedures:  Renal ultrasound  Chest xray  Antibiotics:  Primaxin  Vancomycin  HPI/Subjective: Patient is more alert today and responding to questions.  No acute issues overnight reported.  Objective: Filed Vitals:   01/08/12 2019 01/09/12 0429 01/09/12 1047 01/09/12 1316  BP: 111/56 118/53 127/69 122/81  Pulse: 99 90 88   Temp: 99.1 F (37.3 C) 98.4 F (36.9 C) 98.9 F (37.2 C) 99.2 F (37.3 C)  TempSrc: Oral Oral Oral Oral  Resp: 18 16 16 16   Height:      Weight: 82 kg (180 lb 12.4 oz)     SpO2: 93% 92% 91% 92%    Intake/Output Summary (Last 24 hours) at 01/09/12 1343 Last data filed at 01/09/12 8119  Gross per 24 hour  Intake    300 ml  Output    350 ml  Net    -50 ml   Filed Weights   01/08/12 0017 01/08/12 2019  Weight: 81.4 kg (179 lb 7.3 oz) 82 kg (180 lb 12.4 oz)    Exam:   General:  Pt in NAD, Alert and Awake  Cardiovascular: RRR, no murmurs  Respiratory: no wheezes, no increased work of breathing, speaking in full sentences  Abdomen: soft, NT, ND  Data Reviewed: Basic Metabolic Panel:  Lab 01/09/12 1478 01/08/12 0600 01/07/12 2010 01/07/12 1740  NA 140 138 -- 134*  K 3.7 4.0 -- 4.6  CL 106 103 -- 95*  CO2 23 21 -- 24  GLUCOSE 151* 111* -- 111*  BUN 29* 60* -- 74*  CREATININE 1.15* 1.86* -- 2.38*  CALCIUM 8.5 8.7 -- 9.1  MG -- --  2.4 --  PHOS -- -- -- --   Liver Function Tests: No results found for this basename: AST:5,ALT:5,ALKPHOS:5,BILITOT:5,PROT:5,ALBUMIN:5 in the last 168 hours No results found for this basename: LIPASE:5,AMYLASE:5 in the last 168 hours No results found for this basename: AMMONIA:5 in the last 168 hours CBC:  Lab 01/09/12 0700 01/08/12 0600 01/07/12 1740  WBC 12.1* 12.3* 14.4*  NEUTROABS -- -- 11.1*  HGB 9.4* 9.3* 10.2*  HCT 28.8* 27.5* 31.0*  MCV 90.3 88.7 89.3  PLT  366 316 311   Cardiac Enzymes:  Lab 01/07/12 2008  CKTOTAL --  CKMB --  CKMBINDEX --  TROPONINI <0.30   BNP (last 3 results) No results found for this basename: PROBNP:3 in the last 8760 hours CBG:  Lab 01/08/12 1006  GLUCAP 148*    Recent Results (from the past 240 hour(s))  URINE CULTURE     Status: Normal   Collection Time   01/06/12 12:24 PM      Component Value Range Status Comment   Colony Count >=100,000 COLONIES/ML   Final    Organism ID, Bacteria Multiple bacterial morphotypes present, none   Final    Organism ID, Bacteria predominant. Suggest appropriate recollection if    Final    Organism ID, Bacteria clinically indicated.   Final   URINE CULTURE     Status: Normal (Preliminary result)   Collection Time   01/07/12  9:18 PM      Component Value Range Status Comment   Specimen Description URINE, CATHETERIZED   Final    Special Requests NONE   Final    Culture  Setup Time 01/08/2012 00:35   Final    Colony Count >=100,000 COLONIES/ML   Final    Culture PROTEUS MIRABILIS   Final    Report Status PENDING   Incomplete      Studies: Dg Chest 2 View  01/07/2012  *RADIOLOGY REPORT*  Clinical Data: Short of breath.  Fever.  CHEST - 2 VIEW  Comparison: 05/02/2011.  Findings: Low volume chest.  Low volumes accentuate the cardiopericardial silhouette which may be mildly enlarged. Pulmonary vascular congestion is suspected.  No focal consolidations.  No effusion.  IMPRESSION: Low volume chest.  Possible mild CHF.  Consider repeat PA and lateral with full inspiration when patient condition permits.   Original Report Authenticated By: Andreas Newport, M.D.    US Renal  01/07/2012  *RADIOLOGY REPORT*  Clinical Data: Urinary tract infection.  Chronic hydronephrosis. Neurogenic bladder.  RENAL/URINARY TRACT ULTRASOUND COMPLETE  Comparison:  CT 12/11/2011.  Findings:  Right Kidney:  13.6 cm.  Chronic severe hydronephrosis.  No pyonephrosis is identified.  Allowing for differences  in technique, the renal pelvis appears similar to the prior examination.  Left Kidney:  13.2 cm.  Chronic severe hydronephrosis.  No pyonephrosis is identified.  Allowing for differences in technique, the renal pelvis appears similar to the prior examination.  Bladder:  Poorly visualized.  IMPRESSION: Chronic bilateral hydronephrosis.  Allowing for differences in technique, no interval change from 12/11/2011.  No convincing evidence of pyonephrosis of the dilated collecting system.   Original Report Authenticated By: Andreas Newport, M.D.    Dg Foot Complete Left  01/07/2012  *RADIOLOGY REPORT*  Clinical Data: Short of breath.  Fever.  Urinary tract infection. Osteomyelitis.  LEFT FOOT - COMPLETE 3+ VIEW  Comparison: None.  Findings: No acute osseous abnormality.  Plantar flexion of the toes.  First MTP joint osteoarthritis is moderate to severe.  No osteolysis.  Achilles insertional  of Achilles enthesopathy is present.  Midfoot appears within normal limits.  IMPRESSION: No acute osseous abnormality.   Original Report Authenticated By: Andreas Newport, M.D.     Scheduled Meds:    . atorvastatin  20 mg Oral Daily  . carbidopa-levodopa  1 tablet Oral TID WC  . docusate sodium  100 mg Oral Daily  . feeding supplement  237 mL Oral BID BM  . feeding supplement  1 Container Oral TID BM  . imipenem-cilastatin  250 mg Intravenous Q6H  . metoprolol succinate  12.5 mg Oral Daily  . multivitamin with minerals  1 tablet Oral Daily  . oxybutynin  5 mg Oral BID  . pantoprazole  40 mg Oral Daily  . sodium chloride  3 mL Intravenous Q12H  . vancomycin  750 mg Intravenous Q24H  . venlafaxine  37.5 mg Oral Q breakfast   Continuous Infusions:    . sodium chloride 75 mL/hr at 01/08/12 2352    Principal Problem:  *Complicated UTI (urinary tract infection) Active Problems:  Supranuclear palsies, progressive  Lower extremity weakness  ARF (acute renal failure)  Heel ulcer    Time spent: > 40  minutes    Katherine Ware  [facility] Pager [phone]. If 8PM-8AM, please contact night-coverage at [email or web address], password [facility] 01/09/2012, 1:43 PM  LOS: 2 days

## 2012-01-09 NOTE — Evaluation (Signed)
Clinical/Bedside Swallow Evaluation Patient Details  Name: Katherine Ware MRN: [id number] Date of Birth: 1940/02/20  Today's Date: 01/09/2012 Time: 1400-1430 SLP Time Calculation (min): 30 min  Past Medical History:  Past Medical History  Diagnosis Date  . Hyperlipidemia   . Hypertension   . Asthma   . Hypokalemia   . Osteoporosis   . Osteoarthritis   . 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 Pearlean Brownie  . Parkinsonian syndrome     saw Darrol Angel NP   . Progressive supranuclear palsy     sees Dr. Dan Humphreys at [facility]   . Neurogenic bladder     sees Dr. Retta Diones   . Frequent UTI    Past Surgical History:  Past Surgical History  Procedure Date  . Cholecystectomy   . Breast surgery     benign left breast cyst  . Knee surgery     left knee  . Carpal tunnel release     bilateral  . Epidural steroid     to lumbar spine 8-11 and 9-11  . Esophageal dilation     per Dr. Yancey Flemings   . Colonscopy 2010    per Dr. Marina Goodell, benign polyps, repeat 3 years   . Esophageal dilation     dr  Marina Goodell   . Tubal ligation   . Benign  right breat    HPI:  71 year-old female with history of progressive supranuclear palsy, hypertension was brought to the ER because patient has been experiencing subjective feeling of fever and chills over last 2-3 days. In addition patient has been having dysuria or difficulty urinating. Patient's dysuria and urinary difficulties has been going on for last few weeks and had followed with urologist and had CT abdomen done last month which showed bilateral hydronephrosis with hydroureter and felt to be having bladder outlet obstruction versus neurogenic bladder. Patient's symptoms again started happening 2 days ago and at that time patient's family care physician has placed patient on Bactrim despite which patient still has symptoms had completely ER. In the ER patient was found to be afebrile though family had noticed fever at her house with temperatures running  around 103F. Patient in addition has been becoming progressively weaker in bed bound over the last 2 weeks and also had a fall last week. Patient also developed a heel ulcer.  Patient referred for BSE due to family reporting increased coughing with thin liquids.     Assessment / Plan / Recommendation Clinical Impression  Moderate oral dysphagia with suspected pharyngeal dysphagia.  Dysphagia mulitfactorial:  decreased sensory with noted delay in initiation , increased weakness, postural as patient noted to tilt head to right, and baseline esophageal involvement.  No outward s/s of aspiration noted but evaluation was limited due to patient accepting only sparingly amounts of solids and thin water trials with agitation at end of evaluation.   Patient's family reports increased coughing ( weak and ineffective per family) with wet vocal quality with thin liquids last couple of weeks with what sounds like expectoration of solids.  Due to history of dyshagia and current weakness recommend to proceed with objective assessement of MBS to assess risk for aspiration and recommend safest, possible PO diet.  Continue current diet consistency of dysphagia 2 and thin liquids with full supervision.  ST to follow in acute care setting.      Aspiration Risk  Moderate    Diet Recommendation Dysphagia 2 (Fine chop);Thin liquid   Liquid Administration via: Cup;Straw  Medication Administration: Crushed with puree Supervision: Full supervision/cueing for compensatory strategies Compensations: Slow rate;Small sips/bites;Follow solids with liquid;Multiple dry swallows after each bite/sip Postural Changes and/or Swallow Maneuvers: Seated upright 90 degrees;Upright 30-60 min after meal    Other  Recommendations Recommended Consults: MBS Oral Care Recommendations: Oral care QID Other Recommendations: Clarify dietary restrictions   Follow Up Recommendations  Skilled Nursing facility;24 hour supervision/assistance     Frequency and Duration min 2x/week  2 weeks       SLP Swallow Goals Patient will consume recommended diet without observed clinical signs of aspiration with: Maximum assistance Patient will utilize recommended strategies during swallow to increase swallowing safety with: Maximum assistance   Swallow Study Prior Functional Status  Lives at home with 24 hour care    General Date of Onset: 01/07/12 HPI: 71 year-old female with history of progressive supranuclear palsy, hypertension was brought to the ER because patient has been experiencing subjective feeling of fever and chills over last 2-3 days. In addition patient has been having dysuria or difficulty urinating. Patient's dysuria and urinary difficulties has been going on for last few weeks and had followed with urologist and had CT abdomen done last month which showed bilateral hydronephrosis with hydroureter and felt to be having bladder outlet obstruction versus neurogenic bladder. Patient's symptoms again started happening 2 days ago and at that time patient's family care physician has placed patient on Bactrim despite which patient still has symptoms had completely ER. In the ER patient was found to be afebrile though family had noticed fever at her house with temperatures running around 103F. Patient in addition has been becoming progressively weaker in bed bound over the last 2 weeks and also had a fall last week. Patient also developed a heel ulcer. Type of Study: Bedside swallow evaluation Previous Swallow Assessment: Patient and family report prior swallow evaluation at [facility] in October of 2014 Diet Prior to this Study: Dysphagia 2 (chopped);Thin liquids Temperature Spikes Noted: No Respiratory Status: Room air Behavior/Cognition: Alert;Agitated;Decreased sustained attention Oral Cavity - Dentition: Adequate natural dentition Self-Feeding Abilities: Total assist Patient Positioning: Upright in bed Baseline Vocal Quality:  Clear;Low vocal intensity Volitional Cough: Weak Volitional Swallow: Able to elicit    Oral/Motor/Sensory Function Overall Oral Motor/Sensory Function: Impaired Labial ROM: Within Functional Limits Labial Symmetry: Within Functional Limits Labial Strength: Reduced Labial Sensation: Reduced Lingual ROM: Reduced right;Reduced left Lingual Strength: Reduced Lingual Sensation: Reduced   Ice Chips Ice chips: Impaired Oral Phase Impairments: Reduced labial seal Pharyngeal Phase Impairments: Suspected delayed Swallow;Decreased hyoid-laryngeal movement   Thin Liquid Thin Liquid: Impaired Pharyngeal  Phase Impairments: Suspected delayed Swallow;Decreased hyoid-laryngeal movement    Nectar Thick Nectar Thick Liquid: Not tested   Honey Thick Honey Thick Liquid: Not tested   Puree Puree: Impaired Presentation: Spoon Pharyngeal Phase Impairments: Suspected delayed Swallow;Decreased hyoid-laryngeal movement   Solid   GO Functional Assessment Tool Used: Clinical judgement Functional Limitations: Swallowing Swallow Current Status (Z6109): At least 40 percent but less than 60 percent impaired, limited or restricted Swallow Goal Status [phone]): At least 40 percent but less than 60 percent impaired, limited or restricted Swallow Discharge Status [phone]): At least 40 percent but less than 60 percent impaired, limited or restricted  Solid: Impaired Oral Phase Impairments: Reduced lingual movement/coordination Pharyngeal Phase Impairments: Suspected delayed Swallow;Decreased hyoid-laryngeal movement      Moreen Fowler MS, CCC-SLP [phone] [facility] 01/09/2012,5:45 PM

## 2012-01-09 NOTE — Progress Notes (Signed)
Subjective: 1 - Acute Renal Failure - Pt with increased Cr at admission >2 and known chronic hydronephrosis that has been felt to be non-obstructive. Cr now 1.16 with hydration alone.   2 - Urinary Tract Infection  - Pt with proteus by UCX at admission, sensitivities pending, on empiric therapy.  Pt manages bladder with leakage into bed pads at baseline with prn CIC. Has been getting CIC in house during this admission.   Objective: Vital signs in last 24 hours: Temp:  [98.4 F (36.9 C)-99.2 F (37.3 C)] 99.2 F (37.3 C) (12/14 1316) Pulse Rate:  [88-99] 88  (12/14 1047) Resp:  [16-18] 16  (12/14 1818) BP: (111-127)/(53-81) 122/81 mmHg (12/14 1316) SpO2:  [91 %-93 %] 92 % (12/14 1316) Weight:  [82 kg (180 lb 12.4 oz)] 82 kg (180 lb 12.4 oz) (12/13 2019) Last BM Date: 01/07/12  Intake/Output from previous day: 12/13 0701 - 12/14 0700 In: 300 [IV Piggyback:300] Out: 350 [Urine:350] Intake/Output this shift: Total I/O In: -  Out: 300 [Urine:300]  General appearance: alert, cooperative and debilitated, LE in braces. Head: Normocephalic, without obvious abnormality, atraumatic Eyes: conjunctivae/corneas clear. PERRL, EOM's intact. Fundi benign. Ears: normal TM's and external ear canals both ears Nose: Nares normal. Septum midline. Mucosa normal. No drainage or sinus tenderness. Throat: lips, mucosa, and tongue normal; teeth and gums normal Neck: no adenopathy, no carotid bruit, no JVD, supple, symmetrical, trachea midline and thyroid not enlarged, symmetric, no tenderness/mass/nodules Back: symmetric, no curvature. ROM normal. No CVA tenderness. GI: obese, non tender abdomen Pelvic: external genitalia normal, vagina normal without discharge and CIC performed wtih some terminal hematuria, no clots Extremities: extremities normal, atraumatic, no cyanosis or edema Skin: Skin color, texture, turgor normal. No rashes or lesions Lymph nodes: Cervical, supraclavicular, and axillary  nodes normal. Neurologic: Mental status: Alert, oriented, thought content appropriate  Lab Results:   Basename 01/09/12 0700 01/08/12 0600  WBC 12.1* 12.3*  HGB 9.4* 9.3*  HCT 28.8* 27.5*  PLT 366 316   BMET  Basename 01/09/12 0700 01/08/12 0600  NA 140 138  K 3.7 4.0  CL 106 103  CO2 23 21  GLUCOSE 151* 111*  BUN 29* 60*  CREATININE 1.15* 1.86*  CALCIUM 8.5 8.7   PT/INR No results found for this basename: LABPROT:2,INR:2 in the last 72 hours ABG No results found for this basename: PHART:2,PCO2:2,PO2:2,HCO3:2 in the last 72 hours  Studies/Results: Dg Chest 2 View  01/07/2012  *RADIOLOGY REPORT*  Clinical Data: Short of breath.  Fever.  CHEST - 2 VIEW  Comparison: 05/02/2011.  Findings: Low volume chest.  Low volumes accentuate the cardiopericardial silhouette which may be mildly enlarged. Pulmonary vascular congestion is suspected.  No focal consolidations.  No effusion.  IMPRESSION: Low volume chest.  Possible mild CHF.  Consider repeat PA and lateral with full inspiration when patient condition permits.   Original Report Authenticated By: Andreas Newport, M.D.    US Renal  01/07/2012  *RADIOLOGY REPORT*  Clinical Data: Urinary tract infection.  Chronic hydronephrosis. Neurogenic bladder.  RENAL/URINARY TRACT ULTRASOUND COMPLETE  Comparison:  CT 12/11/2011.  Findings:  Right Kidney:  13.6 cm.  Chronic severe hydronephrosis.  No pyonephrosis is identified.  Allowing for differences in technique, the renal pelvis appears similar to the prior examination.  Left Kidney:  13.2 cm.  Chronic severe hydronephrosis.  No pyonephrosis is identified.  Allowing for differences in technique, the renal pelvis appears similar to the prior examination.  Bladder:  Poorly visualized.  IMPRESSION: Chronic  bilateral hydronephrosis.  Allowing for differences in technique, no interval change from 12/11/2011.  No convincing evidence of pyonephrosis of the dilated collecting system.   Original Report  Authenticated By: Andreas Newport, M.D.    Dg Foot Complete Left  01/07/2012  *RADIOLOGY REPORT*  Clinical Data: Short of breath.  Fever.  Urinary tract infection. Osteomyelitis.  LEFT FOOT - COMPLETE 3+ VIEW  Comparison: None.  Findings: No acute osseous abnormality.  Plantar flexion of the toes.  First MTP joint osteoarthritis is moderate to severe.  No osteolysis.  Achilles insertional of Achilles enthesopathy is present.  Midfoot appears within normal limits.  IMPRESSION: No acute osseous abnormality.   Original Report Authenticated By: Andreas Newport, M.D.     Anti-infectives: Anti-infectives     Start     Dose/Rate Route Frequency Ordered Stop   01/10/12 0400   vancomycin (VANCOCIN) 1,500 mg in sodium chloride 0.9 % 500 mL IVPB        1,500 mg 250 mL/hr over 120 Minutes Intravenous Every 24 hours 01/09/12 1438     01/09/12 2200   vancomycin (VANCOCIN) IVPB 1000 mg/200 mL premix  Status:  Discontinued        1,000 mg 200 mL/hr over 60 Minutes Intravenous  Once 01/08/12 0105 01/08/12 0944   01/09/12 2000   imipenem-cilastatin (PRIMAXIN) 500 mg in sodium chloride 0.9 % 100 mL IVPB        500 mg 200 mL/hr over 30 Minutes Intravenous Every 8 hours 01/09/12 1436     01/09/12 0400   vancomycin (VANCOCIN) 750 mg in sodium chloride 0.9 % 150 mL IVPB  Status:  Discontinued        750 mg 150 mL/hr over 60 Minutes Intravenous Every 24 hours 01/08/12 0945 01/09/12 1438   01/08/12 1500   imipenem-cilastatin (PRIMAXIN) 250 mg in sodium chloride 0.9 % 100 mL IVPB  Status:  Discontinued        250 mg 200 mL/hr over 30 Minutes Intravenous Every 6 hours 01/08/12 0945 01/09/12 1436   01/08/12 0200   vancomycin (VANCOCIN) 1,250 mg in sodium chloride 0.9 % 250 mL IVPB        1,250 mg 166.7 mL/hr over 90 Minutes Intravenous  Once 01/08/12 0105 01/08/12 0507   01/08/12 0200   imipenem-cilastatin (PRIMAXIN) 250 mg in sodium chloride 0.9 % 100 mL IVPB  Status:  Discontinued        250 mg 200 mL/hr  over 30 Minutes Intravenous Every 12 hours 01/08/12 0105 01/08/12 0945   01/07/12 2130   cefTRIAXone (ROCEPHIN) 1 g in dextrose 5 % 50 mL IVPB        1 g 100 mL/hr over 30 Minutes Intravenous  Once 01/07/12 2124 01/07/12 2226          Assessment/Plan: 1 - Acute Renal Failure - Likely pre-renal with prompt response to hydration. No further GU intervention warranted,   2 - Urinary Tract Infection  - Agree with empiric regimen with CX pending. Improving clinically.   [facility], Katherine Ware 01/09/2012

## 2012-01-10 MED ORDER — HYDROCODONE-ACETAMINOPHEN 5-325 MG PO TABS
1.0000 | ORAL_TABLET | ORAL | Status: DC | PRN
  Administered 2012-01-10: 1 via ORAL
  Administered 2012-01-11 (×2): 2 via ORAL
  Administered 2012-01-12 – 2012-01-14 (×7): 1 via ORAL
  Administered 2012-01-14: 2 via ORAL
  Administered 2012-01-15 (×2): 1 via ORAL
  Filled 2012-01-10 (×3): qty 1
  Filled 2012-01-10: qty 2
  Filled 2012-01-10 (×3): qty 1
  Filled 2012-01-10: qty 2
  Filled 2012-01-10 (×3): qty 1
  Filled 2012-01-10 (×2): qty 2
  Filled 2012-01-10: qty 1

## 2012-01-10 MED ORDER — CIPROFLOXACIN HCL 500 MG PO TABS
500.0000 mg | ORAL_TABLET | Freq: Two times a day (BID) | ORAL | Status: DC
  Administered 2012-01-10 – 2012-01-12 (×5): 500 mg via ORAL
  Filled 2012-01-10 (×7): qty 1

## 2012-01-10 NOTE — Progress Notes (Signed)
[facility] PROGRESS NOTE  Katherine Ware:[id number] DOB: [birth year] DOA: 01/07/2012 PCP: Nelwyn Salisbury, MD  Assessment/Plan: #1. Complicated UTI - Urology on board.  -  Blood cultures and urine cultures obtained in the ED and currently pending. -  Urine culture growing proteus mirabilis sensitive to ciprofloxacin as such will plan on placing patient on cipro 01/10/12 -  Renal ultrasound shows chronic BL hydronephrosis and also reports No convincing evidence of pyonephrosis of the dilated collecting system. - Urology on board and making recommendations.   - infectious etiology likely causing hematuria. Per urology's note patient has already had cystoscopy and will defer further work-up/recommendations to them.  #2. Progressive weakness with history of progressive supranuclear palsy - Neurology initially consulted.  At this point plan is for intermittent catheterization   #3. Acute renal failure - probably secondary to dehydration poor oral intake and patient is also on Bactrim, lisinopril, diuretics and NSAIDs which could all have further worsened her renal function.  - Improved with rehydration and and cessation of above listed medications.   - As listed above urology on board and plans for patient are for intermittent catheterization.  #4. Anemia - closely follow CBC patient stable and reportedly hematuria is improved daily.  #5. Left heel ulcer - wound consult requested. Prevalon boots to decrease pressure to BLE  #6. Hypertension - continue home medications except for lisinopril. Geven soft blood pressures will plan on decreasing metoprolol. When necessary IV hydralazine for systolic blood pressure more than 160.   Code Status: DNR Family Communication: spoke with patient and spouse at bedside Disposition Plan: Pending clinical improvement.   Consultants:  Urology  Neurology contacted will plan on having them reevaluate patient once her mentation  improves.  Procedures:  Renal ultrasound  Chest xray  Antibiotics:  Primaxin  Vancomycin  HPI/Subjective: Patient is more alert today and responding to questions.  No acute issues overnight reported.  She reports that when she gets intermittent catheterization she has discomfort with it.  Would like to try more pain medication prior to planned catheterization sessions.  Objective: Filed Vitals:   01/09/12 2138 01/09/12 2300 01/10/12 0548 01/10/12 0900  BP: 135/57  148/71 137/61  Pulse: 99  89 94  Temp: 98.2 F (36.8 C)  98.5 F (36.9 C) 99 F (37.2 C)  TempSrc: Oral  Oral Oral  Resp: 17  17 17   Height:      Weight:  88.3 kg (194 lb 10.7 oz)    SpO2: 99%  95% 93%    Intake/Output Summary (Last 24 hours) at 01/10/12 1121 Last data filed at 01/10/12 0900  Gross per 24 hour  Intake    120 ml  Output    900 ml  Net   -780 ml   Filed Weights   01/08/12 0017 01/08/12 2019 01/09/12 2300  Weight: 81.4 kg (179 lb 7.3 oz) 82 kg (180 lb 12.4 oz) 88.3 kg (194 lb 10.7 oz)    Exam:   General:  Pt in NAD, Alert and Awake  Cardiovascular: RRR, no murmurs  Respiratory: no wheezes, no increased work of breathing, speaking in full sentences  Abdomen: soft, NT, ND  Data Reviewed: Basic Metabolic Panel:  Lab 01/09/12 8657 01/08/12 0600 01/07/12 2010 01/07/12 1740  NA 140 138 -- 134*  K 3.7 4.0 -- 4.6  CL 106 103 -- 95*  CO2 23 21 -- 24  GLUCOSE 151* 111* -- 111*  BUN 29* 60* -- 74*  CREATININE 1.15* 1.86* -- 2.38*  CALCIUM 8.5 8.7 -- 9.1  MG -- -- 2.4 --  PHOS -- -- -- --   Liver Function Tests: No results found for this basename: AST:5,ALT:5,ALKPHOS:5,BILITOT:5,PROT:5,ALBUMIN:5 in the last 168 hours No results found for this basename: LIPASE:5,AMYLASE:5 in the last 168 hours No results found for this basename: AMMONIA:5 in the last 168 hours CBC:  Lab 01/09/12 0700 01/08/12 0600 01/07/12 1740  WBC 12.1* 12.3* 14.4*  NEUTROABS -- -- 11.1*  HGB 9.4* 9.3* 10.2*   HCT 28.8* 27.5* 31.0*  MCV 90.3 88.7 89.3  PLT 366 316 311   Cardiac Enzymes:  Lab 01/07/12 2008  CKTOTAL --  CKMB --  CKMBINDEX --  TROPONINI <0.30   BNP (last 3 results) No results found for this basename: PROBNP:3 in the last 8760 hours CBG:  Lab 01/08/12 1006  GLUCAP 148*    Recent Results (from the past 240 hour(s))  URINE CULTURE     Status: Normal   Collection Time   01/06/12 12:24 PM      Component Value Range Status Comment   Colony Count >=100,000 COLONIES/ML   Final    Organism ID, Bacteria Multiple bacterial morphotypes present, none   Final    Organism ID, Bacteria predominant. Suggest appropriate recollection if    Final    Organism ID, Bacteria clinically indicated.   Final   URINE CULTURE     Status: Normal   Collection Time   01/07/12  9:18 PM      Component Value Range Status Comment   Specimen Description URINE, CATHETERIZED   Final    Special Requests NONE   Final    Culture  Setup Time 01/08/2012 00:35   Final    Colony Count >=100,000 COLONIES/ML   Final    Culture PROTEUS MIRABILIS   Final    Report Status 01/09/2012 FINAL   Final    Organism ID, Bacteria PROTEUS MIRABILIS   Final   CULTURE, BLOOD (ROUTINE X 2)     Status: Normal (Preliminary result)   Collection Time   01/07/12 11:41 PM      Component Value Range Status Comment   Specimen Description BLOOD HAND RIGHT   Final    Special Requests BOTTLES DRAWN AEROBIC AND ANAEROBIC 10CC   Final    Culture  Setup Time 01/08/2012 04:54   Final    Culture     Final    Value:        BLOOD CULTURE RECEIVED NO GROWTH TO DATE CULTURE WILL BE HELD FOR 5 DAYS BEFORE ISSUING A FINAL NEGATIVE REPORT   Report Status PENDING   Incomplete   CULTURE, BLOOD (ROUTINE X 2)     Status: Normal (Preliminary result)   Collection Time   01/07/12 11:50 PM      Component Value Range Status Comment   Specimen Description BLOOD HAND LEFT   Final    Special Requests BOTTLES DRAWN AEROBIC AND ANAEROBIC [facility] R5CC    Final    Culture  Setup Time 01/08/2012 04:54   Final    Culture     Final    Value:        BLOOD CULTURE RECEIVED NO GROWTH TO DATE CULTURE WILL BE HELD FOR 5 DAYS BEFORE ISSUING A FINAL NEGATIVE REPORT   Report Status PENDING   Incomplete      Studies: No results found.  Scheduled Meds:    . atorvastatin  20 mg Oral Daily  . carbidopa-levodopa  1 tablet Oral TID  WC  . docusate sodium  100 mg Oral Daily  . feeding supplement  237 mL Oral BID BM  . feeding supplement  1 Container Oral TID BM  . imipenem-cilastatin  500 mg Intravenous Q8H  . metoprolol succinate  12.5 mg Oral Daily  . multivitamin with minerals  1 tablet Oral Daily  . oxybutynin  5 mg Oral BID  . pantoprazole  40 mg Oral Daily  . sodium chloride  3 mL Intravenous Q12H  . vancomycin  1,500 mg Intravenous Q24H  . venlafaxine  37.5 mg Oral Q breakfast   Continuous Infusions:    Principal Problem:  *Complicated UTI (urinary tract infection) Active Problems:  Supranuclear palsies, progressive  Lower extremity weakness  ARF (acute renal failure)  Heel ulcer    Time spent: > 30 minutes    Penny Pia  [facility] Pager [phone]. If 8PM-8AM, please contact night-coverage at [email or web address], password [facility] 01/10/2012, 11:21 AM  LOS: 3 days

## 2012-01-10 NOTE — Progress Notes (Signed)
Spoke with Dr. Berneice Heinrich, urologist, concerning no active orders found for I&O cath. Will monitor patient for abd pain and see how she does tonight without catheterization.  Steele Berg RN

## 2012-01-10 NOTE — Progress Notes (Signed)
ANTIBIOTIC CONSULT NOTE - FOLLOW UP  Pharmacy Consult for Cipro Indication: Complicated UTI  Allergies  Allergen Reactions  . Sulfa Antibiotics     unknown    Patient Measurements: Height: 4' 9.48" (146 cm) Weight: 194 lb 10.7 oz (88.3 kg) IBW/kg (Calculated) : 39.7  Adjusted Body Weight:   Vital Signs: Temp: 99 F (37.2 C) (12/15 0900) Temp src: Oral (12/15 0900) BP: 137/61 mmHg (12/15 0900) Pulse Rate: 94  (12/15 0900) Intake/Output from previous day: 12/14 0701 - 12/15 0700 In: -  Out: 900 [Urine:900] Intake/Output from this shift: Total I/O In: 120 [P.O.:120] Out: -   Labs:  Basename 01/09/12 0700 01/08/12 0600 01/07/12 2118 01/07/12 1740  WBC 12.1* 12.3* -- 14.4*  HGB 9.4* 9.3* -- 10.2*  PLT 366 316 -- 311  LABCREA -- -- 52.80 --  CREATININE 1.15* 1.86* -- 2.38*   Estimated Creatinine Clearance: 43.1 ml/min (by C-G formula based on Cr of 1.15). No results found for this basename: VANCOTROUGH:2,VANCOPEAK:2,VANCORANDOM:2,GENTTROUGH:2,GENTPEAK:2,GENTRANDOM:2,TOBRATROUGH:2,TOBRAPEAK:2,TOBRARND:2,AMIKACINPEAK:2,AMIKACINTROU:2,AMIKACIN:2, in the last 72 hours   Microbiology: Recent Results (from the past 720 hour(s))  URINE CULTURE     Status: Normal   Collection Time   01/06/12 12:24 PM      Component Value Range Status Comment   Colony Count >=100,000 COLONIES/ML   Final    Organism ID, Bacteria Multiple bacterial morphotypes present, none   Final    Organism ID, Bacteria predominant. Suggest appropriate recollection if    Final    Organism ID, Bacteria clinically indicated.   Final   URINE CULTURE     Status: Normal   Collection Time   01/07/12  9:18 PM      Component Value Range Status Comment   Specimen Description URINE, CATHETERIZED   Final    Special Requests NONE   Final    Culture  Setup Time 01/08/2012 00:35   Final    Colony Count >=100,000 COLONIES/ML   Final    Culture PROTEUS MIRABILIS   Final    Report Status 01/09/2012 FINAL   Final    Organism ID, Bacteria PROTEUS MIRABILIS   Final   CULTURE, BLOOD (ROUTINE X 2)     Status: Normal (Preliminary result)   Collection Time   01/07/12 11:41 PM      Component Value Range Status Comment   Specimen Description BLOOD HAND RIGHT   Final    Special Requests BOTTLES DRAWN AEROBIC AND ANAEROBIC 10CC   Final    Culture  Setup Time 01/08/2012 04:54   Final    Culture     Final    Value:        BLOOD CULTURE RECEIVED NO GROWTH TO DATE CULTURE WILL BE HELD FOR 5 DAYS BEFORE ISSUING A FINAL NEGATIVE REPORT   Report Status PENDING   Incomplete   CULTURE, BLOOD (ROUTINE X 2)     Status: Normal (Preliminary result)   Collection Time   01/07/12 11:50 PM      Component Value Range Status Comment   Specimen Description BLOOD HAND LEFT   Final    Special Requests BOTTLES DRAWN AEROBIC AND ANAEROBIC B [facility] R5CC   Final    Culture  Setup Time 01/08/2012 04:54   Final    Culture     Final    Value:        BLOOD CULTURE RECEIVED NO GROWTH TO DATE CULTURE WILL BE HELD FOR 5 DAYS BEFORE ISSUING A FINAL NEGATIVE REPORT   Report Status PENDING  Incomplete     Anti-infectives     Start     Dose/Rate Route Frequency Ordered Stop   01/10/12 0400   vancomycin (VANCOCIN) 1,500 mg in sodium chloride 0.9 % 500 mL IVPB  Status:  Discontinued        1,500 mg 250 mL/hr over 120 Minutes Intravenous Every 24 hours 01/09/12 1438 01/10/12 1129   01/09/12 2200   vancomycin (VANCOCIN) IVPB 1000 mg/200 mL premix  Status:  Discontinued        1,000 mg 200 mL/hr over 60 Minutes Intravenous  Once 01/08/12 0105 01/08/12 0944   01/09/12 2000   imipenem-cilastatin (PRIMAXIN) 500 mg in sodium chloride 0.9 % 100 mL IVPB  Status:  Discontinued        500 mg 200 mL/hr over 30 Minutes Intravenous Every 8 hours 01/09/12 1436 01/10/12 1129   01/09/12 0400   vancomycin (VANCOCIN) 750 mg in sodium chloride 0.9 % 150 mL IVPB  Status:  Discontinued        750 mg 150 mL/hr over 60 Minutes Intravenous Every 24 hours 01/08/12  0945 01/09/12 1438   01/08/12 1500   imipenem-cilastatin (PRIMAXIN) 250 mg in sodium chloride 0.9 % 100 mL IVPB  Status:  Discontinued        250 mg 200 mL/hr over 30 Minutes Intravenous Every 6 hours 01/08/12 0945 01/09/12 1436   01/08/12 0200   vancomycin (VANCOCIN) 1,250 mg in sodium chloride 0.9 % 250 mL IVPB        1,250 mg 166.7 mL/hr over 90 Minutes Intravenous  Once 01/08/12 0105 01/08/12 0507   01/08/12 0200   imipenem-cilastatin (PRIMAXIN) 250 mg in sodium chloride 0.9 % 100 mL IVPB  Status:  Discontinued        250 mg 200 mL/hr over 30 Minutes Intravenous Every 12 hours 01/08/12 0105 01/08/12 0945   01/07/12 2130   cefTRIAXone (ROCEPHIN) 1 g in dextrose 5 % 50 mL IVPB        1 g 100 mL/hr over 30 Minutes Intravenous  Once 01/07/12 2124 01/07/12 2226          Assessment: 71yof on Vancomycin and Primaxin Day 3 now transitioning to Cipro for complicated Proteus UTI (pan sensitive). Patient is afebrile and WBC are slowly trending down. Patient is tolerating oral meds.  - CrCl 43 (SCr 1.15)  Plan:  1. Cipro 500mg  po BID 2. Follow-up clinical status, renal function and LOT  Cleon Dew [phone] 01/10/2012,12:16 PM

## 2012-01-11 ENCOUNTER — Observation Stay (HOSPITAL_COMMUNITY): Payer: Medicare Other

## 2012-01-11 ENCOUNTER — Other Ambulatory Visit (HOSPITAL_COMMUNITY): Payer: Medicare Other

## 2012-01-11 LAB — CBC
MCH: 29.9 pg (ref 26.0–34.0)
MCHC: 32 g/dL (ref 30.0–36.0)
Platelets: 377 10*3/uL (ref 150–400)
RDW: 13.9 % (ref 11.5–15.5)

## 2012-01-11 LAB — BASIC METABOLIC PANEL
Calcium: 8.4 mg/dL (ref 8.4–10.5)
GFR calc Af Amer: 78 mL/min — ABNORMAL LOW (ref >90)
GFR calc non Af Amer: 67 mL/min — ABNORMAL LOW (ref >90)
Glucose, Bld: 109 mg/dL — ABNORMAL HIGH (ref 70–99)
Potassium: 4 mEq/L (ref 3.5–5.1)
Sodium: 143 mEq/L (ref 135–145)

## 2012-01-11 MED ORDER — POLYVINYL ALCOHOL 1.4 % OP SOLN
1.0000 [drp] | OPHTHALMIC | Status: DC | PRN
  Administered 2012-01-11 – 2012-01-12 (×2): 1 [drp] via OPHTHALMIC
  Filled 2012-01-11: qty 15

## 2012-01-11 MED ORDER — STARCH (THICKENING) PO POWD: ORAL | Status: DC | PRN

## 2012-01-11 MED ORDER — RESOURCE THICKENUP CLEAR PO POWD
ORAL | Status: DC | PRN
  Filled 2012-01-11 (×2): qty 125

## 2012-01-11 NOTE — Consult Note (Signed)
Wound care follow-up: Bilat heels remain with deep tissue injuries.  Receeding in size since initial assessment.  Left heel 3X3cm, 50% intact blister. 50% dark purple in middle of wound bed.  Right heel .5X.5cm,  50% intact blister. 50% dark purple in middle of wound bed.  Continue present plan of care with Prevalon boots to redistribute pressure.  Husband at bedside to assess wound improvement and discuss continued use of Prevalon boots if pt D/C home to promote healing.  Cammie Mcgee, RN, MSN, [employer]  [phone]

## 2012-01-11 NOTE — Progress Notes (Signed)
Speech Language Pathology Dysphagia Treatment Patient Details Name: Katherine Ware MRN: [id number] DOB: 1940-07-04 Today's Date: 01/11/2012 Time: 1415-1430 SLP Time Calculation (min): 15 min  Assessment / Plan / Recommendation Clinical Impression  This SLP arrived to post swallow precaution sign above bed (for primary therapist) and husband at bedside who was not present for MBS earlier today.  SLP reviewed MBS results and recommendations, provided clinical rationale for treatment plan and demonstrated thickening of soda to achieve nectar consistency.  Pt. consumed nectar thick soda via straw with slight likely delay in swallow initiation without s/s aspiration.  SLP will continue to follow to further ensure safety with recommendations and ongoing education.         Diet Recommendation  Continue with Current Diet: Dysphagia 2 (fine chop);Nectar-thick liquid    SLP Plan Continue with current plan of care       Swallowing Goals  SLP Swallowing Goals Patient will consume recommended diet without observed clinical signs of aspiration with: Maximum assistance Swallow Study Goal #1 - Progress: Progressing toward goal Patient will utilize recommended strategies during swallow to increase swallowing safety with: Maximum assistance  General Temperature Spikes Noted: No Respiratory Status: Room air Behavior/Cognition: Alert;Cooperative Oral Cavity - Dentition: Adequate natural dentition Patient Positioning: Postural control interferes with function  Oral Cavity - Oral Hygiene Does patient have any of the following "at risk" factors?: Lips - dry, cracked Patient is HIGH RISK - Oral Care Protocol followed (see row info): Yes Patient is AT RISK - Oral Care Protocol followed (see row info): Yes   Dysphagia Treatment Treatment focused on: Patient/family/caregiver education;Skilled observation of diet tolerance Family/Caregiver Educated: husband and sister (at end of session) Treatment  Methods/Modalities: Skilled observation Patient observed directly with PO's: Yes Type of PO's observed: Nectar-thick liquids Feeding: Total assist Liquids provided via: Straw Pharyngeal Phase Signs & Symptoms: Suspected delayed swallow initiation Type of cueing: Verbal Amount of cueing: Minimal        Royce Macadamia M.Ed [employer] [phone]  01/11/2012

## 2012-01-11 NOTE — Procedures (Signed)
Objective Swallowing Evaluation: Modified Barium Swallowing Study  Patient Details  Name: Katherine Ware MRN: [id number] Date of Birth: 07/26/1940  Today's Date: 01/11/2012 Time: 1115-1200 SLP Time Calculation (min): 45 min  Past Medical History:  Past Medical History  Diagnosis Date  . Hyperlipidemia   . Hypertension   . Asthma   . Hypokalemia   . Osteoporosis   . Osteoarthritis   . 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 Pearlean Brownie  . Parkinsonian syndrome     saw Darrol Angel NP   . Progressive supranuclear palsy     sees Dr. Dan Humphreys at [facility]   . Neurogenic bladder     sees Dr. Retta Diones   . Frequent UTI    Past Surgical History:  Past Surgical History  Procedure Date  . Cholecystectomy   . Breast surgery     benign left breast cyst  . Knee surgery     left knee  . Carpal tunnel release     bilateral  . Epidural steroid     to lumbar spine 8-11 and 9-11  . Esophageal dilation     per Dr. Yancey Flemings   . Colonscopy 2010    per Dr. Marina Goodell, benign polyps, repeat 3 years   . Esophageal dilation     dr  Marina Goodell   . Tubal ligation   . Benign  right breat    HPI:  71 year-old female with history of progressive supranuclear palsy, hypertension was brought to the ER because patient has been experiencing subjective feeling of fever and chills over last 2-3 days. In addition patient has been having dysuria or difficulty urinating. Patient's dysuria and urinary difficulties has been going on for last few weeks and had followed with urologist and had CT abdomen done last month which showed bilateral hydronephrosis with hydroureter and felt to be having bladder outlet obstruction versus neurogenic bladder. Patient's symptoms again started happening 2 days ago and at that time patient's family care physician has placed patient on Bactrim despite which patient still has symptoms had completely ER. In the ER patient was found to be afebrile though family had noticed fever at her  house with temperatures running around 103F. Patient in addition has been becoming progressively weaker in bed bound over the last 2 weeks and also had a fall last week. Patient also developed a heel ulcer.. According to pts sister who cares for her, her head is always tilet to the right but she sits up all day in a chair and always eats sitting up. Her family 'mashes up' her food but she has also been able to eat solid meats. She becomes fatigued at meals.      Assessment / Plan / Recommendation Clinical Impression  Dysphagia Diagnosis: Moderate pharyngeal phase dysphagia;Moderate oral phase dysphagia Clinical impression: Pt presents with a moderate oral and oropharyngeal deysphagia. Oral phase characterized by reduced lingual manipulation and struggle to propel bolus postiorally with ineffective tounge pumping movement. Fatigue impacted fucntion with significant pocketing at end of session. Primary sensory deficit lead to moderate pharyngeal dysphagia with a delay in swallow initiation to the pyriform sinuses with normal size thin liquids boluses (cup or straw) with silent aspiration before the swallow. With cues for very small straw sips aspiration was not observed. Nectar thick liquids only resulted in flash penetration before the swallow. Attmpeted chin tuck with no improvement. Though very small straw sips prevented aspriation during this study it is unlikely that pt would consistently take  small enough sips to prevent aspiration.  Pt is also at significant risk of aspiration of reflux given appearance during this study and pts report of frequent regurgitation during this hospital stay.    At this time, given acute illness, fatigue and poor positioning in the hospital (without chair) pts risk of impact from aspiration is high. Would recommend pt consume nectar thick liquids with a dysphagia 2 (fine chop) diet during the short term. Recommend frequent short meals to reduce impact of fatigue on  function. Encouraged pt to try it for now to help her determine if she wants to continue on a modified diet or accept risk of aspiration. Currently her CXR does not indicate pna so there is a possiblity she has tolerated aspiration up to this point.     Treatment Recommendation  Therapy as outlined in treatment plan below    Diet Recommendation Dysphagia 2 (Fine chop);Nectar-thick liquid   Liquid Administration via: Straw Medication Administration: Crushed with puree Supervision: Full supervision/cueing for compensatory strategies Compensations: Slow rate;Small sips/bites;Follow solids with liquid;Multiple dry swallows after each bite/sip Postural Changes and/or Swallow Maneuvers: Seated upright 90 degrees;Upright 30-60 min after meal    Other  Recommendations Oral Care Recommendations: Oral care QID   Follow Up Recommendations  Skilled Nursing facility;24 hour supervision/assistance    Frequency and Duration min 2x/week  2 weeks   Pertinent Vitals/Pain NA    SLP Swallow Goals Patient will utilize recommended strategies during swallow to increase swallowing safety with: Maximum assistance   General HPI: 71 year-old female with history of progressive supranuclear palsy, hypertension was brought to the ER because patient has been experiencing subjective feeling of fever and chills over last 2-3 days. In addition patient has been having dysuria or difficulty urinating. Patient's dysuria and urinary difficulties has been going on for last few weeks and had followed with urologist and had CT abdomen done last month which showed bilateral hydronephrosis with hydroureter and felt to be having bladder outlet obstruction versus neurogenic bladder. Patient's symptoms again started happening 2 days ago and at that time patient's family care physician has placed patient on Bactrim despite which patient still has symptoms had completely ER. In the ER patient was found to be afebrile though family had  noticed fever at her house with temperatures running around 103F. Patient in addition has been becoming progressively weaker in bed bound over the last 2 weeks and also had a fall last week. Patient also developed a heel ulcer.. According to pts sister who cares for her, her head is always tilet to the right but she sits up all day in a chair and always eats sitting up. Her family 'mashes up' her food but she has also been able to eat solid meats. She becomes fatigued at meals.  Type of Study: Modified Barium Swallowing Study Reason for Referral: Objectively evaluate swallowing function Previous Swallow Assessment: Patient and family report prior swallow evaluation at [facility] in October of 2013 Diet Prior to this Study: Dysphagia 2 (chopped);Thin liquids Temperature Spikes Noted: No Respiratory Status: Room air History of Recent Intubation: No Behavior/Cognition: Alert;Cooperative Oral Cavity - Dentition: Adequate natural dentition Oral Motor / Sensory Function: Impaired - see Bedside swallow eval Self-Feeding Abilities: Needs assist Patient Positioning: Postural control interferes with function Baseline Vocal Quality: Clear;Low vocal intensity Volitional Cough: Weak Volitional Swallow: Able to elicit Anatomy: Within functional limits Pharyngeal Secretions: Not observed secondary MBS    Reason for Referral Objectively evaluate swallowing function   Oral Phase Oral  Preparation/Oral Phase Oral Phase: Impaired Oral - Nectar Oral - Nectar Straw: Weak lingual manipulation;Reduced posterior propulsion;Delayed oral transit Oral - Thin Oral - Thin Teaspoon: Weak lingual manipulation;Reduced posterior propulsion;Delayed oral transit Oral - Thin Cup: Weak lingual manipulation;Reduced posterior propulsion;Delayed oral transit Oral - Thin Straw: Weak lingual manipulation;Reduced posterior propulsion;Delayed oral transit Oral - Solids Oral - Puree: Lingual pumping;Weak lingual manipulation;Reduced  posterior propulsion;Piecemeal swallowing;Delayed oral transit Oral - Mechanical Soft: Delayed oral transit;Lingual/palatal residue;Right pocketing in lateral sulci;Pocketing in anterior sulcus;Holding of bolus;Reduced posterior propulsion;Weak lingual manipulation Oral - Pill: Reduced posterior propulsion;Lingual/palatal residue;Piecemeal swallowing;Delayed oral transit;Lingual pumping;Weak lingual manipulation   Pharyngeal Phase Pharyngeal Phase Pharyngeal Phase: Impaired Pharyngeal - Nectar Pharyngeal - Nectar Straw: Delayed swallow initiation;Premature spillage to valleculae;Premature spillage to pyriform sinuses;Penetration/Aspiration before swallow Penetration/Aspiration details (nectar straw): Material enters airway, remains ABOVE vocal cords then ejected out;Material does not enter airway Pharyngeal - Thin Pharyngeal - Thin Teaspoon: Not tested Pharyngeal - Thin Cup: Delayed swallow initiation;Premature spillage to valleculae;Premature spillage to pyriform sinuses;Penetration/Aspiration before swallow;Moderate aspiration Penetration/Aspiration details (thin cup): Material enters airway, passes BELOW cords without attempt by patient to eject out (silent aspiration);Material does not enter airway Pharyngeal - Thin Straw: Delayed swallow initiation;Premature spillage to valleculae;Premature spillage to pyriform sinuses;Moderate aspiration;Penetration/Aspiration before swallow Penetration/Aspiration details (thin straw): Material does not enter airway;Material enters airway, remains ABOVE vocal cords then ejected out;Material enters airway, passes BELOW cords without attempt by patient to eject out (silent aspiration) Pharyngeal - Solids Pharyngeal - Puree: Delayed swallow initiation Pharyngeal - Regular: Delayed swallow initiation  Cervical Esophageal Phase    GO    Cervical Esophageal Phase Cervical Esophageal Phase: Impaired Cervical Esophageal Phase - Comment Cervical Esophageal  Comment: Appearance of significant residual in distal esophagus with constant superior and inferior surging through GE junction. No radiologist present to confirm        [facility], MA CCC-SLP [phone]  [name] 01/11/2012, 1:17 PM

## 2012-01-11 NOTE — Progress Notes (Signed)
[facility] PROGRESS NOTE  Katherine Ware ZOX:[id number] DOB: [birth year] DOA: 01/07/2012 PCP: Nelwyn Salisbury, MD  Assessment/Plan: #1. Complicated UTI - Urology on board.  -  Blood cultures and urine cultures obtained in the ED and currently pending. -  Urine culture growing proteus mirabilis sensitive to ciprofloxacin as such will plan on placing patient on cipro 01/10/12 -  Renal ultrasound shows chronic BL hydronephrosis and also reports No convincing evidence of pyonephrosis of the dilated collecting system. - Urology on board and making recommendations.  Discussed intermittent catheterization with Dr. Berneice Heinrich.  At this juncture they have been discontinued to see if patient is able to get back to baseline.  Will f/u with Dr. Lenoria Chime recommendations. - infectious etiology likely causing hematuria. Per urology's note patient has already had cystoscopy and will defer further work-up/recommendations to them.  #2. 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  #3. Acute renal failure - probably secondary to dehydration poor oral intake and patient is also on Bactrim, lisinopril, diuretics and NSAIDs which could all have further worsened her renal function.  - Improved with rehydration and and cessation of above listed medications.   - will recheck creatinine next am off of intermittent catheterizations.  #4. Anemia - closely follow CBC patient stable and reportedly hematuria is improved daily.  #5. Left heel ulcer - wound consult requested. Prevalon boots to decrease pressure to BLE  #6. Hypertension - continue home medications except for lisinopril. Geven soft blood pressures will plan on decreasing metoprolol. When necessary IV hydralazine for systolic blood pressure more than 160.   Code Status: DNR Family Communication: spoke with patient and spouse at bedside Disposition Plan: Pending  clinical improvement.   Consultants:  Urology  Neurology contacted will plan on having them reevaluate patient once her mentation improves.  Procedures:  Renal ultrasound  Chest xray  Antibiotics:  Primaxin  Vancomycin  HPI/Subjective: No acute issues reported overnight.  Discussed that with progressive disease and possibility of intermittent catheterization it may be reasonable to transition patient to SNF for further physical therapy and treatment options.  Husband has considered it and will discuss it with the family.   Objective: Filed Vitals:   01/10/12 2128 01/11/12 0544 01/11/12 0854 01/11/12 1430  BP: 144/64 123/54 112/51 153/77  Pulse: 96 92 88 97  Temp: 99 F (37.2 C) 98.9 F (37.2 C) 98.6 F (37 C) 98.6 F (37 C)  TempSrc: Oral Oral  Oral  Resp: 18 18 18 16   Height: 4' 9.48" (1.46 m)     Weight: 87.3 kg (192 lb 7.4 oz)     SpO2: 93% 93% 95% 91%    Intake/Output Summary (Last 24 hours) at 01/11/12 1642 Last data filed at 01/11/12 1300  Gross per 24 hour  Intake    240 ml  Output    250 ml  Net    -10 ml   Filed Weights   01/08/12 2019 01/09/12 2300 01/10/12 2128  Weight: 82 kg (180 lb 12.4 oz) 88.3 kg (194 lb 10.7 oz) 87.3 kg (192 lb 7.4 oz)    Exam:   General:  Pt in NAD, Alert and Awake  Cardiovascular: RRR, no murmurs  Respiratory: no wheezes, no increased work of breathing, speaking in full sentences  Abdomen: soft, NT, ND  Data Reviewed: Basic Metabolic Panel:  Lab 01/11/12 8119 01/09/12 0700 01/08/12 0600 01/07/12 2010 01/07/12 1740  NA 143 140 138 --  134*  K 4.0 3.7 4.0 -- 4.6  CL 108 106 103 -- 95*  CO2 26 23 21  -- 24  GLUCOSE 109* 151* 111* -- 111*  BUN 16 29* 60* -- 74*  CREATININE 0.86 1.15* 1.86* -- 2.38*  CALCIUM 8.4 8.5 8.7 -- 9.1  MG -- -- -- 2.4 --  PHOS -- -- -- -- --   Liver Function Tests: No results found for this basename: AST:5,ALT:5,ALKPHOS:5,BILITOT:5,PROT:5,ALBUMIN:5 in the last 168 hours No results  found for this basename: LIPASE:5,AMYLASE:5 in the last 168 hours No results found for this basename: AMMONIA:5 in the last 168 hours CBC:  Lab 01/11/12 0730 01/09/12 0700 01/08/12 0600 01/07/12 1740  WBC 12.3* 12.1* 12.3* 14.4*  NEUTROABS -- -- -- 11.1*  HGB 9.8* 9.4* 9.3* 10.2*  HCT 30.6* 28.8* 27.5* 31.0*  MCV 93.3 90.3 88.7 89.3  PLT 377 366 316 311   Cardiac Enzymes:  Lab 01/07/12 2008  CKTOTAL --  CKMB --  CKMBINDEX --  TROPONINI <0.30   BNP (last 3 results) No results found for this basename: PROBNP:3 in the last 8760 hours CBG:  Lab 01/08/12 1006  GLUCAP 148*    Recent Results (from the past 240 hour(s))  URINE CULTURE     Status: Normal   Collection Time   01/06/12 12:24 PM      Component Value Range Status Comment   Colony Count >=100,000 COLONIES/ML   Final    Organism ID, Bacteria Multiple bacterial morphotypes present, none   Final    Organism ID, Bacteria predominant. Suggest appropriate recollection if    Final    Organism ID, Bacteria clinically indicated.   Final   URINE CULTURE     Status: Normal   Collection Time   01/07/12  9:18 PM      Component Value Range Status Comment   Specimen Description URINE, CATHETERIZED   Final    Special Requests NONE   Final    Culture  Setup Time 01/08/2012 00:35   Final    Colony Count >=100,000 COLONIES/ML   Final    Culture PROTEUS MIRABILIS   Final    Report Status 01/09/2012 FINAL   Final    Organism ID, Bacteria PROTEUS MIRABILIS   Final   CULTURE, BLOOD (ROUTINE X 2)     Status: Normal (Preliminary result)   Collection Time   01/07/12 11:41 PM      Component Value Range Status Comment   Specimen Description BLOOD HAND RIGHT   Final    Special Requests BOTTLES DRAWN AEROBIC AND ANAEROBIC 10CC   Final    Culture  Setup Time 01/08/2012 04:54   Final    Culture     Final    Value:        BLOOD CULTURE RECEIVED NO GROWTH TO DATE CULTURE WILL BE HELD FOR 5 DAYS BEFORE ISSUING A FINAL NEGATIVE REPORT   Report  Status PENDING   Incomplete   CULTURE, BLOOD (ROUTINE X 2)     Status: Normal (Preliminary result)   Collection Time   01/07/12 11:50 PM      Component Value Range Status Comment   Specimen Description BLOOD HAND LEFT   Final    Special Requests BOTTLES DRAWN AEROBIC AND ANAEROBIC B [facility] R5CC   Final    Culture  Setup Time 01/08/2012 04:54   Final    Culture     Final    Value:        BLOOD CULTURE RECEIVED  NO GROWTH TO DATE CULTURE WILL BE HELD FOR 5 DAYS BEFORE ISSUING A FINAL NEGATIVE REPORT   Report Status PENDING   Incomplete      Studies: Dg Swallowing Func-speech Pathology  01/11/2012  Riley Nearing Deblois, CCC-SLP     01/11/2012  1:20 PM Objective Swallowing Evaluation: Modified Barium Swallowing Study   Patient Details  Name: Katherine Ware MRN: [id number] Date of Birth: [birth year]  Today's Date: 01/11/2012 Time: 1115-1200 SLP Time Calculation (min): 45 min  Past Medical History:  Past Medical History  Diagnosis Date  . Hyperlipidemia   . Hypertension   . Asthma   . Hypokalemia   . Osteoporosis   . Osteoarthritis   . 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 Pearlean Brownie  . Parkinsonian syndrome     saw Darrol Angel NP   . Progressive supranuclear palsy     sees Dr. Dan Humphreys at [facility]   . Neurogenic bladder     sees Dr. Retta Diones   . Frequent UTI    Past Surgical History:  Past Surgical History  Procedure Date  . Cholecystectomy   . Breast surgery     benign left breast cyst  . Knee surgery     left knee  . Carpal tunnel release     bilateral  . Epidural steroid     to lumbar spine 8-11 and 9-11  . Esophageal dilation     per Dr. Yancey Flemings   . Colonscopy 2010    per Dr. Marina Goodell, benign polyps, repeat 3 years   . Esophageal dilation     dr  Marina Goodell   . Tubal ligation   . Benign  right breat    HPI:  71 year-old female with history of progressive supranuclear  palsy, hypertension was brought to the ER because patient has  been experiencing subjective feeling of fever and chills over  last 2-3 days.  In addition patient has been having dysuria or  difficulty urinating. Patient's dysuria and urinary difficulties  has been going on for last few weeks and had followed with  urologist and had CT abdomen done last month which showed  bilateral hydronephrosis with hydroureter and felt to be having  bladder outlet obstruction versus neurogenic bladder. Patient's  symptoms again started happening 2 days ago and at that time  patient's family care physician has placed patient on Bactrim  despite which patient still has symptoms had completely ER. In  the ER patient was found to be afebrile though family had noticed  fever at her house with temperatures running around 103F.  Patient in addition has been becoming progressively weaker in bed  bound over the last 2 weeks and also had a fall last week.  Patient also developed a heel ulcer.. According to pts sister who  cares for her, her head is always tilet to the right but she sits  up all day in a chair and always eats sitting up. Her family  'mashes up' her food but she has also been able to eat solid  meats. She becomes fatigued at meals.      Assessment / Plan / Recommendation Clinical Impression  Dysphagia Diagnosis: Moderate pharyngeal phase  dysphagia;Moderate oral phase dysphagia Clinical impression: Pt presents with a moderate oral and  oropharyngeal deysphagia. Oral phase characterized by reduced  lingual manipulation and struggle to propel bolus postiorally  with ineffective tounge pumping movement. Fatigue impacted  fucntion with significant pocketing at end of session. Primary  sensory  deficit lead to moderate pharyngeal dysphagia with a  delay in swallow initiation to the pyriform sinuses with normal  size thin liquids boluses (cup or straw) with silent aspiration  before the swallow. With cues for very small straw sips  aspiration was not observed. Nectar thick liquids only resulted  in flash penetration before the swallow. Attmpeted chin tuck with  no  improvement. Though very small straw sips prevented aspriation  during this study it is unlikely that pt would consistently take  small enough sips to prevent aspiration.  Pt is also at  significant risk of aspiration of reflux given appearance during  this study and pts report of frequent regurgitation during this  hospital stay.    At this time, given acute illness, fatigue and poor positioning  in the hospital (without chair) pts risk of impact from  aspiration is high. Would recommend pt consume nectar thick  liquids with a dysphagia 2 (fine chop) diet during the short  term. Recommend frequent short meals to reduce impact of fatigue  on function. Encouraged pt to try it for now to help her  determine if she wants to continue on a modified diet or accept  risk of aspiration. Currently her CXR does not indicate pna so  there is a possiblity she has tolerated aspiration up to this  point.     Treatment Recommendation  Therapy as outlined in treatment plan below    Diet Recommendation Dysphagia 2 (Fine chop);Nectar-thick liquid   Liquid Administration via: Straw Medication Administration: Crushed with puree Supervision: Full supervision/cueing for compensatory strategies Compensations: Slow rate;Small sips/bites;Follow solids with  liquid;Multiple dry swallows after each bite/sip Postural Changes and/or Swallow Maneuvers: Seated upright 90  degrees;Upright 30-60 min after meal    Other  Recommendations Oral Care Recommendations: Oral care QID   Follow Up Recommendations  Skilled Nursing facility;24 hour supervision/assistance    Frequency and Duration min 2x/week  2 weeks   Pertinent Vitals/Pain NA    SLP Swallow Goals Patient will utilize recommended strategies during swallow to  increase swallowing safety with: Maximum assistance   General HPI: 71 year-old female with history of progressive  supranuclear palsy, hypertension was brought to the ER because  patient has been experiencing subjective feeling of fever  and  chills over last 2-3 days. In addition patient has been having  dysuria or difficulty urinating. Patient's dysuria and urinary  difficulties has been going on for last few weeks and had  followed with urologist and had CT abdomen done last month which  showed bilateral hydronephrosis with hydroureter and felt to be  having bladder outlet obstruction versus neurogenic bladder.  Patient's symptoms again started happening 2 days ago and at that  time patient's family care physician has placed patient on  Bactrim despite which patient still has symptoms had completely  ER. In the ER patient was found to be afebrile though family had  noticed fever at her house with temperatures running around  103F. Patient in addition has been becoming progressively weaker  in bed bound over the last 2 weeks and also had a fall last week.  Patient also developed a heel ulcer.. According to pts sister who  cares for her, her head is always tilet to the right but she sits  up all day in a chair and always eats sitting up. Her family  'mashes up' her food but she has also been able to eat solid  meats. She becomes fatigued at meals.  Type of  Study: Modified Barium Swallowing Study Reason for Referral: Objectively evaluate swallowing function Previous Swallow Assessment: Patient and family report prior  swallow evaluation at [facility] in October of 2013 Diet Prior to this Study: Dysphagia 2 (chopped);Thin liquids Temperature Spikes Noted: No Respiratory Status: Room air History of Recent Intubation: No Behavior/Cognition: Alert;Cooperative Oral Cavity - Dentition: Adequate natural dentition Oral Motor / Sensory Function: Impaired - see Bedside swallow  eval Self-Feeding Abilities: Needs assist Patient Positioning: Postural control interferes with function Baseline Vocal Quality: Clear;Low vocal intensity Volitional Cough: Weak Volitional Swallow: Able to elicit Anatomy: Within functional limits Pharyngeal Secretions: Not observed  secondary MBS    Reason for Referral Objectively evaluate swallowing function   Oral Phase Oral Preparation/Oral Phase Oral Phase: Impaired Oral - Nectar Oral - Nectar Straw: Weak lingual manipulation;Reduced posterior  propulsion;Delayed oral transit Oral - Thin Oral - Thin Teaspoon: Weak lingual manipulation;Reduced posterior  propulsion;Delayed oral transit Oral - Thin Cup: Weak lingual manipulation;Reduced posterior  propulsion;Delayed oral transit Oral - Thin Straw: Weak lingual manipulation;Reduced posterior  propulsion;Delayed oral transit Oral - Solids Oral - Puree: Lingual pumping;Weak lingual manipulation;Reduced  posterior propulsion;Piecemeal swallowing;Delayed oral transit Oral - Mechanical Soft: Delayed oral transit;Lingual/palatal  residue;Right pocketing in lateral sulci;Pocketing in anterior  sulcus;Holding of bolus;Reduced posterior propulsion;Weak lingual  manipulation Oral - Pill: Reduced posterior propulsion;Lingual/palatal  residue;Piecemeal swallowing;Delayed oral transit;Lingual  pumping;Weak lingual manipulation   Pharyngeal Phase Pharyngeal Phase Pharyngeal Phase: Impaired Pharyngeal - Nectar Pharyngeal - Nectar Straw: Delayed swallow initiation;Premature  spillage to valleculae;Premature spillage to pyriform  sinuses;Penetration/Aspiration before swallow Penetration/Aspiration details (nectar straw): Material enters  airway, remains ABOVE vocal cords then ejected out;Material does  not enter airway Pharyngeal - Thin Pharyngeal - Thin Teaspoon: Not tested Pharyngeal - Thin Cup: Delayed swallow initiation;Premature  spillage to valleculae;Premature spillage to pyriform  sinuses;Penetration/Aspiration before swallow;Moderate aspiration Penetration/Aspiration details (thin cup): Material enters  airway, passes BELOW cords without attempt by patient to eject  out (silent aspiration);Material does not enter airway Pharyngeal - Thin Straw: Delayed swallow initiation;Premature  spillage to  valleculae;Premature spillage to pyriform  sinuses;Moderate aspiration;Penetration/Aspiration before swallow Penetration/Aspiration details (thin straw): Material does not  enter airway;Material enters airway, remains ABOVE vocal cords  then ejected out;Material enters airway, passes BELOW cords  without attempt by patient to eject out (silent aspiration) Pharyngeal - Solids Pharyngeal - Puree: Delayed swallow initiation Pharyngeal - Regular: Delayed swallow initiation  Cervical Esophageal Phase    GO    Cervical Esophageal Phase Cervical Esophageal Phase: Impaired Cervical Esophageal Phase - Comment Cervical Esophageal Comment: Appearance of significant residual  in distal esophagus with constant superior and inferior surging  through GE junction. No radiologist present to confirm        [facility], [place] CCC-SLP [phone]  [name] 01/11/2012, 1:17 PM      Scheduled Meds:    . atorvastatin  20 mg Oral Daily  . carbidopa-levodopa  1 tablet Oral TID WC  . ciprofloxacin  500 mg Oral BID  . docusate sodium  100 mg Oral Daily  . feeding supplement  237 mL Oral BID BM  . feeding supplement  1 Container Oral TID BM  . metoprolol succinate  12.5 mg Oral Daily  . multivitamin with minerals  1 tablet Oral Daily  . oxybutynin  5 mg Oral BID  . pantoprazole  40 mg Oral Daily  . sodium chloride  3 mL Intravenous Q12H  . venlafaxine  37.5 mg Oral Q breakfast   Continuous Infusions:  Principal Problem:  *Complicated UTI (urinary tract infection) Active Problems:  Supranuclear palsies, progressive  Lower extremity weakness  ARF (acute renal failure)  Heel ulcer    Time spent: > 30 minutes    Katherine Ware  [facility] Pager [phone]. If 8PM-8AM, please contact night-coverage at [email or web address], password [facility] 01/11/2012, 4:42 PM  LOS: 4 days

## 2012-01-12 LAB — CBC
HCT: 31.2 % — ABNORMAL LOW (ref 36.0–46.0)
Hemoglobin: 10.1 g/dL — ABNORMAL LOW (ref 12.0–15.0)
RBC: 3.35 MIL/uL — ABNORMAL LOW (ref 3.87–5.11)
RDW: 13.7 % (ref 11.5–15.5)
WBC: 13.3 10*3/uL — ABNORMAL HIGH (ref 4.0–10.5)

## 2012-01-12 LAB — BASIC METABOLIC PANEL
Chloride: 108 mEq/L (ref 96–112)
Creatinine, Ser: 0.8 mg/dL (ref 0.50–1.10)
GFR calc Af Amer: 85 mL/min — ABNORMAL LOW (ref >90)
Potassium: 3.9 mEq/L (ref 3.5–5.1)
Sodium: 146 mEq/L — ABNORMAL HIGH (ref 135–145)

## 2012-01-12 MED ORDER — CIPROFLOXACIN HCL 500 MG PO TABS
500.0000 mg | ORAL_TABLET | Freq: Two times a day (BID) | ORAL | Status: DC
  Administered 2012-01-12 – 2012-01-15 (×6): 500 mg via ORAL
  Filled 2012-01-12 (×8): qty 1

## 2012-01-12 NOTE — Progress Notes (Signed)
I and O cath done with some discomfort noted, 300cc of cloudy amber urine with sediment - patient tolerated well.

## 2012-01-12 NOTE — Progress Notes (Signed)
[facility] PROGRESS NOTE  Katherine Ware WUJ:[id number] DOB: 07-05-1940 DOA: 01/07/2012 PCP: Nelwyn Salisbury, MD  Brief HPI: Patient is a 71 y/o with PSP who presented with fevers, dysuria, and chills.  Urology consulted and renal U/S showed bilateral hydronephrosis.  Patient also had hematuria.  Hematuria has been resolving with antibiotics and Urology following and making recommendations.  Patient to get intermittent catheterizations QID.  At this juncture family deciding whether they will be able to perform QID intermittent catheterizations or if transition patient to SNF.  WBC slight elevation and as such it was decided to monitor for one more day for improvement in WBC levels prior to discharging.  Assessment/Plan: #1. Complicated UTI - Urology on board.  -  Blood cultures negative to date. Urine cultures grew out proteus mirabilis. Placed on cipro but WBC -  Urine culture growing proteus mirabilis sensitive to ciprofloxacin as such patient place on this antibiotic with slight elevation today.  Will recheck CBC next am and consider changing antibiotic regimen or reevaluating patient should her WBC not improve on this regimen. -  Renal ultrasound shows chronic BL hydronephrosis and also reports No convincing evidence of pyonephrosis of the dilated collecting system. - Urology on board and making recommendations.  At this juncture they prefer intermittent catheterization qid - infectious etiology likely causing hematuria. Per urology's note patient has already had cystoscopy and will defer further work-up/recommendations to them. Currently clearing up with antibiotic regimen and question whether hematuria is 2ary to trauma from recent caths vs infectious etiology.  #2. 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  #3. Acute renal failure - probably secondary to dehydration poor oral intake and  patient is also on Bactrim, lisinopril, diuretics and NSAIDs which could all have further worsened her renal function.  - Improved with rehydration and and cessation of above listed medications.   - Creatinine improved from initial value and on last recheck was 0.80  #4. Anemia - closely follow CBC patient stable and reportedly hematuria is improved daily.  #5. Left heel ulcer - wound consult requested. Prevalon boots to decrease pressure to BLE  #6. Hypertension - continue home medications except for lisinopril. Geven soft blood pressures will plan on decreasing metoprolol. When necessary IV hydralazine for systolic blood pressure more than 160.   Code Status: DNR Family Communication: spoke with patient and spouse at bedside Disposition Plan: Pending clinical improvement.   Consultants:  Urology  Neurology contacted will plan on having them reevaluate patient once her mentation improves.  Procedures:  Renal ultrasound  Chest xray  Antibiotics:  Primaxin  Vancomycin  HPI/Subjective: No acute issues reported overnight.  Discussed that with progressive disease and possibility of intermittent catheterization it may be reasonable to transition patient to SNF for further physical therapy and treatment options.  Husband has considered it and will discuss it with the family and no decision made yet.  Care manager and social worker aware of situation and awaiting family decision.   Objective: Filed Vitals:   01/11/12 2110 01/12/12 0554 01/12/12 1033 01/12/12 1349  BP: 141/65 148/67 142/71 147/70  Pulse: 91 93 91 94  Temp: 98.9 F (37.2 C) 97 F (36.1 C) 98.6 F (37 C) 98.7 F (37.1 C)  TempSrc: Oral Oral Oral Oral  Resp: 18 18 19 20   Height: 4' 9.48" (1.46 m)     Weight: 86.546 kg (190 lb 12.8 oz)     SpO2:  94% 93% 96% 95%    Intake/Output Summary (Last 24 hours) at 01/12/12 1700 Last data filed at 01/12/12 1300  Gross per 24 hour  Intake    480 ml  Output      0 ml   Net    480 ml   Filed Weights   01/09/12 2300 01/10/12 2128 01/11/12 2110  Weight: 88.3 kg (194 lb 10.7 oz) 87.3 kg (192 lb 7.4 oz) 86.546 kg (190 lb 12.8 oz)    Exam:   General:  Pt in NAD, Alert and Awake  Cardiovascular: RRR, no murmurs  Respiratory: no wheezes, no increased work of breathing, speaking in full sentences  Abdomen: soft, NT, ND  Data Reviewed: Basic Metabolic Panel:  Lab 01/12/12 1610 01/11/12 0730 01/09/12 0700 01/08/12 0600 01/07/12 2010 01/07/12 1740  NA 146* 143 140 138 -- 134*  K 3.9 4.0 3.7 4.0 -- 4.6  CL 108 108 106 103 -- 95*  CO2 26 26 23 21  -- 24  GLUCOSE 103* 109* 151* 111* -- 111*  BUN 13 16 29* 60* -- 74*  CREATININE 0.80 0.86 1.15* 1.86* -- 2.38*  CALCIUM 8.7 8.4 8.5 8.7 -- 9.1  MG -- -- -- -- 2.4 --  PHOS -- -- -- -- -- --   Liver Function Tests: No results found for this basename: AST:5,ALT:5,ALKPHOS:5,BILITOT:5,PROT:5,ALBUMIN:5 in the last 168 hours No results found for this basename: LIPASE:5,AMYLASE:5 in the last 168 hours No results found for this basename: AMMONIA:5 in the last 168 hours CBC:  Lab 01/12/12 0545 01/11/12 0730 01/09/12 0700 01/08/12 0600 01/07/12 1740  WBC 13.3* 12.3* 12.1* 12.3* 14.4*  NEUTROABS -- -- -- -- 11.1*  HGB 10.1* 9.8* 9.4* 9.3* 10.2*  HCT 31.2* 30.6* 28.8* 27.5* 31.0*  MCV 93.1 93.3 90.3 88.7 89.3  PLT 403* 377 366 316 311   Cardiac Enzymes:  Lab 01/07/12 2008  CKTOTAL --  CKMB --  CKMBINDEX --  TROPONINI <0.30   BNP (last 3 results) No results found for this basename: PROBNP:3 in the last 8760 hours CBG:  Lab 01/08/12 1006  GLUCAP 148*    Recent Results (from the past 240 hour(s))  URINE CULTURE     Status: Normal   Collection Time   01/06/12 12:24 PM      Component Value Range Status Comment   Colony Count >=100,000 COLONIES/ML   Final    Organism ID, Bacteria Multiple bacterial morphotypes present, none   Final    Organism ID, Bacteria predominant. Suggest appropriate  recollection if    Final    Organism ID, Bacteria clinically indicated.   Final   URINE CULTURE     Status: Normal   Collection Time   01/07/12  9:18 PM      Component Value Range Status Comment   Specimen Description URINE, CATHETERIZED   Final    Special Requests NONE   Final    Culture  Setup Time 01/08/2012 00:35   Final    Colony Count >=100,000 COLONIES/ML   Final    Culture PROTEUS MIRABILIS   Final    Report Status 01/09/2012 FINAL   Final    Organism ID, Bacteria PROTEUS MIRABILIS   Final   CULTURE, BLOOD (ROUTINE X 2)     Status: Normal (Preliminary result)   Collection Time   01/07/12 11:41 PM      Component Value Range Status Comment   Specimen Description BLOOD HAND RIGHT   Final    Special Requests BOTTLES DRAWN  AEROBIC AND ANAEROBIC 10CC   Final    Culture  Setup Time 01/08/2012 04:54   Final    Culture     Final    Value:        BLOOD CULTURE RECEIVED NO GROWTH TO DATE CULTURE WILL BE HELD FOR 5 DAYS BEFORE ISSUING A FINAL NEGATIVE REPORT   Report Status PENDING   Incomplete   CULTURE, BLOOD (ROUTINE X 2)     Status: Normal (Preliminary result)   Collection Time   01/07/12 11:50 PM      Component Value Range Status Comment   Specimen Description BLOOD HAND LEFT   Final    Special Requests BOTTLES DRAWN AEROBIC AND ANAEROBIC [facility] [id number]   Final    Culture  Setup Time 01/08/2012 04:54   Final    Culture     Final    Value:        BLOOD CULTURE RECEIVED NO GROWTH TO DATE CULTURE WILL BE HELD FOR 5 DAYS BEFORE ISSUING A FINAL NEGATIVE REPORT   Report Status PENDING   Incomplete      Studies: Dg Swallowing Func-speech Pathology  01/11/2012  Riley Nearing Deblois, CCC-SLP     01/11/2012  1:20 PM Objective Swallowing Evaluation: Modified Barium Swallowing Study   Patient Details  Name: MARA FAVERO MRN: [id number] Date of Birth: December 10, 1940  Today's Date: 01/11/2012 Time: 1115-1200 SLP Time Calculation (min): 45 min  Past Medical History:  Past Medical History  Diagnosis  Date  . Hyperlipidemia   . Hypertension   . Asthma   . Hypokalemia   . Osteoporosis   . Osteoarthritis   . 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 Pearlean Brownie  . Parkinsonian syndrome     saw Darrol Angel NP   . Progressive supranuclear palsy     sees Dr. Dan Humphreys at [facility]   . Neurogenic bladder     sees Dr. Retta Diones   . Frequent UTI    Past Surgical History:  Past Surgical History  Procedure Date  . Cholecystectomy   . Breast surgery     benign left breast cyst  . Knee surgery     left knee  . Carpal tunnel release     bilateral  . Epidural steroid     to lumbar spine 8-11 and 9-11  . Esophageal dilation     per Dr. Yancey Flemings   . Colonscopy 2010    per Dr. Marina Goodell, benign polyps, repeat 3 years   . Esophageal dilation     dr  Marina Goodell   . Tubal ligation   . Benign  right breat    HPI:  71 year-old female with history of progressive supranuclear  palsy, hypertension was brought to the ER because patient has  been experiencing subjective feeling of fever and chills over  last 2-3 days. In addition patient has been having dysuria or  difficulty urinating. Patient's dysuria and urinary difficulties  has been going on for last few weeks and had followed with  urologist and had CT abdomen done last month which showed  bilateral hydronephrosis with hydroureter and felt to be having  bladder outlet obstruction versus neurogenic bladder. Patient's  symptoms again started happening 2 days ago and at that time  patient's family care physician has placed patient on Bactrim  despite which patient still has symptoms had completely ER. In  the ER patient was found to be afebrile though family had noticed  fever at her  house with temperatures running around 103F.  Patient in addition has been becoming progressively weaker in bed  bound over the last 2 weeks and also had a fall last week.  Patient also developed a heel ulcer.. According to pts sister who  cares for her, her head is always tilet to the right but she sits  up all  day in a chair and always eats sitting up. Her family  'mashes up' her food but she has also been able to eat solid  meats. She becomes fatigued at meals.      Assessment / Plan / Recommendation Clinical Impression  Dysphagia Diagnosis: Moderate pharyngeal phase  dysphagia;Moderate oral phase dysphagia Clinical impression: Pt presents with a moderate oral and  oropharyngeal deysphagia. Oral phase characterized by reduced  lingual manipulation and struggle to propel bolus postiorally  with ineffective tounge pumping movement. Fatigue impacted  fucntion with significant pocketing at end of session. Primary  sensory deficit lead to moderate pharyngeal dysphagia with a  delay in swallow initiation to the pyriform sinuses with normal  size thin liquids boluses (cup or straw) with silent aspiration  before the swallow. With cues for very small straw sips  aspiration was not observed. Nectar thick liquids only resulted  in flash penetration before the swallow. Attmpeted chin tuck with  no improvement. Though very small straw sips prevented aspriation  during this study it is unlikely that pt would consistently take  small enough sips to prevent aspiration.  Pt is also at  significant risk of aspiration of reflux given appearance during  this study and pts report of frequent regurgitation during this  hospital stay.    At this time, given acute illness, fatigue and poor positioning  in the hospital (without chair) pts risk of impact from  aspiration is high. Would recommend pt consume nectar thick  liquids with a dysphagia 2 (fine chop) diet during the short  term. Recommend frequent short meals to reduce impact of fatigue  on function. Encouraged pt to try it for now to help her  determine if she wants to continue on a modified diet or accept  risk of aspiration. Currently her CXR does not indicate pna so  there is a possiblity she has tolerated aspiration up to this  point.     Treatment Recommendation  Therapy as  outlined in treatment plan below    Diet Recommendation Dysphagia 2 (Fine chop);Nectar-thick liquid   Liquid Administration via: Straw Medication Administration: Crushed with puree Supervision: Full supervision/cueing for compensatory strategies Compensations: Slow rate;Small sips/bites;Follow solids with  liquid;Multiple dry swallows after each bite/sip Postural Changes and/or Swallow Maneuvers: Seated upright 90  degrees;Upright 30-60 min after meal    Other  Recommendations Oral Care Recommendations: Oral care QID   Follow Up Recommendations  Skilled Nursing facility;24 hour supervision/assistance    Frequency and Duration min 2x/week  2 weeks   Pertinent Vitals/Pain NA    SLP Swallow Goals Patient will utilize recommended strategies during swallow to  increase swallowing safety with: Maximum assistance   General HPI: 71 year-old female with history of progressive  supranuclear palsy, hypertension was brought to the ER because  patient has been experiencing subjective feeling of fever and  chills over last 2-3 days. In addition patient has been having  dysuria or difficulty urinating. Patient's dysuria and urinary  difficulties has been going on for last few weeks and had  followed with urologist and had CT abdomen done last month which  showed bilateral  hydronephrosis with hydroureter and felt to be  having bladder outlet obstruction versus neurogenic bladder.  Patient's symptoms again started happening 2 days ago and at that  time patient's family care physician has placed patient on  Bactrim despite which patient still has symptoms had completely  ER. In the ER patient was found to be afebrile though family had  noticed fever at her house with temperatures running around  103F. Patient in addition has been becoming progressively weaker  in bed bound over the last 2 weeks and also had a fall last week.  Patient also developed a heel ulcer.. According to pts sister who  cares for her, her head is always tilet  to the right but she sits  up all day in a chair and always eats sitting up. Her family  'mashes up' her food but she has also been able to eat solid  meats. She becomes fatigued at meals.  Type of Study: Modified Barium Swallowing Study Reason for Referral: Objectively evaluate swallowing function Previous Swallow Assessment: Patient and family report prior  swallow evaluation at [facility] in October of 2013 Diet Prior to this Study: Dysphagia 2 (chopped);Thin liquids Temperature Spikes Noted: No Respiratory Status: Room air History of Recent Intubation: No Behavior/Cognition: Alert;Cooperative Oral Cavity - Dentition: Adequate natural dentition Oral Motor / Sensory Function: Impaired - see Bedside swallow  eval Self-Feeding Abilities: Needs assist Patient Positioning: Postural control interferes with function Baseline Vocal Quality: Clear;Low vocal intensity Volitional Cough: Weak Volitional Swallow: Able to elicit Anatomy: Within functional limits Pharyngeal Secretions: Not observed secondary MBS    Reason for Referral Objectively evaluate swallowing function   Oral Phase Oral Preparation/Oral Phase Oral Phase: Impaired Oral - Nectar Oral - Nectar Straw: Weak lingual manipulation;Reduced posterior  propulsion;Delayed oral transit Oral - Thin Oral - Thin Teaspoon: Weak lingual manipulation;Reduced posterior  propulsion;Delayed oral transit Oral - Thin Cup: Weak lingual manipulation;Reduced posterior  propulsion;Delayed oral transit Oral - Thin Straw: Weak lingual manipulation;Reduced posterior  propulsion;Delayed oral transit Oral - Solids Oral - Puree: Lingual pumping;Weak lingual manipulation;Reduced  posterior propulsion;Piecemeal swallowing;Delayed oral transit Oral - Mechanical Soft: Delayed oral transit;Lingual/palatal  residue;Right pocketing in lateral sulci;Pocketing in anterior  sulcus;Holding of bolus;Reduced posterior propulsion;Weak lingual  manipulation Oral - Pill: Reduced posterior  propulsion;Lingual/palatal  residue;Piecemeal swallowing;Delayed oral transit;Lingual  pumping;Weak lingual manipulation   Pharyngeal Phase Pharyngeal Phase Pharyngeal Phase: Impaired Pharyngeal - Nectar Pharyngeal - Nectar Straw: Delayed swallow initiation;Premature  spillage to valleculae;Premature spillage to pyriform  sinuses;Penetration/Aspiration before swallow Penetration/Aspiration details (nectar straw): Material enters  airway, remains ABOVE vocal cords then ejected out;Material does  not enter airway Pharyngeal - Thin Pharyngeal - Thin Teaspoon: Not tested Pharyngeal - Thin Cup: Delayed swallow initiation;Premature  spillage to valleculae;Premature spillage to pyriform  sinuses;Penetration/Aspiration before swallow;Moderate aspiration Penetration/Aspiration details (thin cup): Material enters  airway, passes BELOW cords without attempt by patient to eject  out (silent aspiration);Material does not enter airway Pharyngeal - Thin Straw: Delayed swallow initiation;Premature  spillage to valleculae;Premature spillage to pyriform  sinuses;Moderate aspiration;Penetration/Aspiration before swallow Penetration/Aspiration details (thin straw): Material does not  enter airway;Material enters airway, remains ABOVE vocal cords  then ejected out;Material enters airway, passes BELOW cords  without attempt by patient to eject out (silent aspiration) Pharyngeal - Solids Pharyngeal - Puree: Delayed swallow initiation Pharyngeal - Regular: Delayed swallow initiation  Cervical Esophageal Phase    GO    Cervical Esophageal Phase Cervical Esophageal Phase: Impaired Cervical Esophageal Phase - Comment Cervical Esophageal Comment: Appearance of  significant residual  in distal esophagus with constant superior and inferior surging  through GE junction. No radiologist present to confirm        [facility], MA CCC-SLP [phone]  [name] 01/11/2012, 1:17 PM      Scheduled Meds:    . atorvastatin  20 mg Oral  Daily  . carbidopa-levodopa  1 tablet Oral TID WC  . docusate sodium  100 mg Oral Daily  . feeding supplement  1 Container Oral TID BM  . metoprolol succinate  12.5 mg Oral Daily  . multivitamin with minerals  1 tablet Oral Daily  . oxybutynin  5 mg Oral BID  . pantoprazole  40 mg Oral Daily  . sodium chloride  3 mL Intravenous Q12H  . venlafaxine  37.5 mg Oral Q breakfast   Continuous Infusions:    Principal Problem:  *Complicated UTI (urinary tract infection) Active Problems:  Supranuclear palsies, progressive  Lower extremity weakness  ARF (acute renal failure)  Heel ulcer    Time spent: > 30 minutes    Penny Pia  [facility] Pager [phone]. If 8PM-8AM, please contact night-coverage at [email or web address], password [facility] 01/12/2012, 5:00 PM  LOS: 5 days

## 2012-01-12 NOTE — Progress Notes (Signed)
Subjective: Patient reports that she has had significant incontinence. She is having no bladder pain. In and out catheterizations have been discontinued, but she has been incontinent.  Objective: Vital signs in last 24 hours: Temp:  [97 F (36.1 C)-98.9 F (37.2 C)] 97 F (36.1 C) (12/17 0554) Pulse Rate:  [88-97] 93  (12/17 0554) Resp:  [16-18] 18  (12/17 0554) BP: (112-153)/(51-77) 148/67 mmHg (12/17 0554) SpO2:  [91 %-95 %] 93 % (12/17 0554) Weight:  [86.546 kg (190 lb 12.8 oz)] 86.546 kg (190 lb 12.8 oz) (12/16 2110)  Intake/Output from previous day: 12/16 0701 - 12/17 0700 In: 240 [P.O.:240] Out: -  Intake/Output this shift:    Physical Exam:  Constitutional: Vital signs reviewed. WD WN in NAD   Eyes: PERRL, No scleral icterus.   Cardiovascular: RRR Pulmonary/Chest: Normal effort Abdominal: Soft. Non-tender, non-distended, bowel sounds are normal, no masses, organomegaly, or guarding present.  Genitourinary: Extremities: No cyanosis or edema   Lab Results:  Basename 01/12/12 0545 01/11/12 0730  HGB 10.1* 9.8*  HCT 31.2* 30.6*   BMET  Basename 01/12/12 0545 01/11/12 0730  NA 146* 143  K 3.9 4.0  CL 108 108  CO2 26 26  GLUCOSE 103* 109*  BUN 13 16  CREATININE 0.80 0.86  CALCIUM 8.7 8.4   No results found for this basename: LABPT:3,INR:3 in the last 72 hours No results found for this basename: LABURIN:1 in the last 72 hours Results for orders placed during the hospital encounter of 01/07/12  URINE CULTURE     Status: Normal   Collection Time   01/07/12  9:18 PM      Component Value Range Status Comment   Specimen Description URINE, CATHETERIZED   Final    Special Requests NONE   Final    Culture  Setup Time 01/08/2012 00:35   Final    Colony Count >=100,000 COLONIES/ML   Final    Culture PROTEUS MIRABILIS   Final    Report Status 01/09/2012 FINAL   Final    Organism ID, Bacteria PROTEUS MIRABILIS   Final   CULTURE, BLOOD (ROUTINE X 2)     Status:  Normal (Preliminary result)   Collection Time   01/07/12 11:41 PM      Component Value Range Status Comment   Specimen Description BLOOD HAND RIGHT   Final    Special Requests BOTTLES DRAWN AEROBIC AND ANAEROBIC 10CC   Final    Culture  Setup Time 01/08/2012 04:54   Final    Culture     Final    Value:        BLOOD CULTURE RECEIVED NO GROWTH TO DATE CULTURE WILL BE HELD FOR 5 DAYS BEFORE ISSUING A FINAL NEGATIVE REPORT   Report Status PENDING   Incomplete   CULTURE, BLOOD (ROUTINE X 2)     Status: Normal (Preliminary result)   Collection Time   01/07/12 11:50 PM      Component Value Range Status Comment   Specimen Description BLOOD HAND LEFT   Final    Special Requests BOTTLES DRAWN AEROBIC AND ANAEROBIC B [facility] R5CC   Final    Culture  Setup Time 01/08/2012 04:54   Final    Culture     Final    Value:        BLOOD CULTURE RECEIVED NO GROWTH TO DATE CULTURE WILL BE HELD FOR 5 DAYS BEFORE ISSUING A FINAL NEGATIVE REPORT   Report Status PENDING   Incomplete  Studies/Results: Dg Swallowing Func-speech Pathology  01/11/2012  Riley Nearing Deblois, CCC-SLP     01/11/2012  1:20 PM Objective Swallowing Evaluation: Modified Barium Swallowing Study   Patient Details  Name: TRINITY HAUN MRN: [id number] Date of Birth: April 05, 1940  Today's Date: 01/11/2012 Time: 1115-1200 SLP Time Calculation (min): 45 min  Past Medical History:  Past Medical History  Diagnosis Date  . Hyperlipidemia   . Hypertension   . Asthma   . Hypokalemia   . Osteoporosis   . Osteoarthritis   . 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 Pearlean Brownie  . Parkinsonian syndrome     saw Darrol Angel NP   . Progressive supranuclear palsy     sees Dr. Dan Humphreys at [facility]   . Neurogenic bladder     sees Dr. Retta Diones   . Frequent UTI    Past Surgical History:  Past Surgical History  Procedure Date  . Cholecystectomy   . Breast surgery     benign left breast cyst  . Knee surgery     left knee  . Carpal tunnel release     bilateral  . Epidural  steroid     to lumbar spine 8-11 and 9-11  . Esophageal dilation     per Dr. Yancey Flemings   . Colonscopy 2010    per Dr. Marina Goodell, benign polyps, repeat 3 years   . Esophageal dilation     dr  Marina Goodell   . Tubal ligation   . Benign  right breat    HPI:  71 year-old female with history of progressive supranuclear  palsy, hypertension was brought to the ER because patient has  been experiencing subjective feeling of fever and chills over  last 2-3 days. In addition patient has been having dysuria or  difficulty urinating. Patient's dysuria and urinary difficulties  has been going on for last few weeks and had followed with  urologist and had CT abdomen done last month which showed  bilateral hydronephrosis with hydroureter and felt to be having  bladder outlet obstruction versus neurogenic bladder. Patient's  symptoms again started happening 2 days ago and at that time  patient's family care physician has placed patient on Bactrim  despite which patient still has symptoms had completely ER. In  the ER patient was found to be afebrile though family had noticed  fever at her house with temperatures running around 103F.  Patient in addition has been becoming progressively weaker in bed  bound over the last 2 weeks and also had a fall last week.  Patient also developed a heel ulcer.. According to pts sister who  cares for her, her head is always tilet to the right but she sits  up all day in a chair and always eats sitting up. Her family  'mashes up' her food but she has also been able to eat solid  meats. She becomes fatigued at meals.      Assessment / Plan / Recommendation Clinical Impression  Dysphagia Diagnosis: Moderate pharyngeal phase  dysphagia;Moderate oral phase dysphagia Clinical impression: Pt presents with a moderate oral and  oropharyngeal deysphagia. Oral phase characterized by reduced  lingual manipulation and struggle to propel bolus postiorally  with ineffective tounge pumping movement. Fatigue impacted   fucntion with significant pocketing at end of session. Primary  sensory deficit lead to moderate pharyngeal dysphagia with a  delay in swallow initiation to the pyriform sinuses with normal  size thin liquids boluses (cup or straw) with silent  aspiration  before the swallow. With cues for very small straw sips  aspiration was not observed. Nectar thick liquids only resulted  in flash penetration before the swallow. Attmpeted chin tuck with  no improvement. Though very small straw sips prevented aspriation  during this study it is unlikely that pt would consistently take  small enough sips to prevent aspiration.  Pt is also at  significant risk of aspiration of reflux given appearance during  this study and pts report of frequent regurgitation during this  hospital stay.    At this time, given acute illness, fatigue and poor positioning  in the hospital (without chair) pts risk of impact from  aspiration is high. Would recommend pt consume nectar thick  liquids with a dysphagia 2 (fine chop) diet during the short  term. Recommend frequent short meals to reduce impact of fatigue  on function. Encouraged pt to try it for now to help her  determine if she wants to continue on a modified diet or accept  risk of aspiration. Currently her CXR does not indicate pna so  there is a possiblity she has tolerated aspiration up to this  point.     Treatment Recommendation  Therapy as outlined in treatment plan below    Diet Recommendation Dysphagia 2 (Fine chop);Nectar-thick liquid   Liquid Administration via: Straw Medication Administration: Crushed with puree Supervision: Full supervision/cueing for compensatory strategies Compensations: Slow rate;Small sips/bites;Follow solids with  liquid;Multiple dry swallows after each bite/sip Postural Changes and/or Swallow Maneuvers: Seated upright 90  degrees;Upright 30-60 min after meal    Other  Recommendations Oral Care Recommendations: Oral care QID   Follow Up Recommendations   Skilled Nursing facility;24 hour supervision/assistance    Frequency and Duration min 2x/week  2 weeks   Pertinent Vitals/Pain NA    SLP Swallow Goals Patient will utilize recommended strategies during swallow to  increase swallowing safety with: Maximum assistance   General HPI: 71 year-old female with history of progressive  supranuclear palsy, hypertension was brought to the ER because  patient has been experiencing subjective feeling of fever and  chills over last 2-3 days. In addition patient has been having  dysuria or difficulty urinating. Patient's dysuria and urinary  difficulties has been going on for last few weeks and had  followed with urologist and had CT abdomen done last month which  showed bilateral hydronephrosis with hydroureter and felt to be  having bladder outlet obstruction versus neurogenic bladder.  Patient's symptoms again started happening 2 days ago and at that  time patient's family care physician has placed patient on  Bactrim despite which patient still has symptoms had completely  ER. In the ER patient was found to be afebrile though family had  noticed fever at her house with temperatures running around  103F. Patient in addition has been becoming progressively weaker  in bed bound over the last 2 weeks and also had a fall last week.  Patient also developed a heel ulcer.. According to pts sister who  cares for her, her head is always tilet to the right but she sits  up all day in a chair and always eats sitting up. Her family  'mashes up' her food but she has also been able to eat solid  meats. She becomes fatigued at meals.  Type of Study: Modified Barium Swallowing Study Reason for Referral: Objectively evaluate swallowing function Previous Swallow Assessment: Patient and family report prior  swallow evaluation at [facility] in October of 2013 Diet  Prior to this Study: Dysphagia 2 (chopped);Thin liquids Temperature Spikes Noted: No Respiratory Status: Room air History of Recent  Intubation: No Behavior/Cognition: Alert;Cooperative Oral Cavity - Dentition: Adequate natural dentition Oral Motor / Sensory Function: Impaired - see Bedside swallow  eval Self-Feeding Abilities: Needs assist Patient Positioning: Postural control interferes with function Baseline Vocal Quality: Clear;Low vocal intensity Volitional Cough: Weak Volitional Swallow: Able to elicit Anatomy: Within functional limits Pharyngeal Secretions: Not observed secondary MBS    Reason for Referral Objectively evaluate swallowing function   Oral Phase Oral Preparation/Oral Phase Oral Phase: Impaired Oral - Nectar Oral - Nectar Straw: Weak lingual manipulation;Reduced posterior  propulsion;Delayed oral transit Oral - Thin Oral - Thin Teaspoon: Weak lingual manipulation;Reduced posterior  propulsion;Delayed oral transit Oral - Thin Cup: Weak lingual manipulation;Reduced posterior  propulsion;Delayed oral transit Oral - Thin Straw: Weak lingual manipulation;Reduced posterior  propulsion;Delayed oral transit Oral - Solids Oral - Puree: Lingual pumping;Weak lingual manipulation;Reduced  posterior propulsion;Piecemeal swallowing;Delayed oral transit Oral - Mechanical Soft: Delayed oral transit;Lingual/palatal  residue;Right pocketing in lateral sulci;Pocketing in anterior  sulcus;Holding of bolus;Reduced posterior propulsion;Weak lingual  manipulation Oral - Pill: Reduced posterior propulsion;Lingual/palatal  residue;Piecemeal swallowing;Delayed oral transit;Lingual  pumping;Weak lingual manipulation   Pharyngeal Phase Pharyngeal Phase Pharyngeal Phase: Impaired Pharyngeal - Nectar Pharyngeal - Nectar Straw: Delayed swallow initiation;Premature  spillage to valleculae;Premature spillage to pyriform  sinuses;Penetration/Aspiration before swallow Penetration/Aspiration details (nectar straw): Material enters  airway, remains ABOVE vocal cords then ejected out;Material does  not enter airway Pharyngeal - Thin Pharyngeal - Thin Teaspoon: Not  tested Pharyngeal - Thin Cup: Delayed swallow initiation;Premature  spillage to valleculae;Premature spillage to pyriform  sinuses;Penetration/Aspiration before swallow;Moderate aspiration Penetration/Aspiration details (thin cup): Material enters  airway, passes BELOW cords without attempt by patient to eject  out (silent aspiration);Material does not enter airway Pharyngeal - Thin Straw: Delayed swallow initiation;Premature  spillage to valleculae;Premature spillage to pyriform  sinuses;Moderate aspiration;Penetration/Aspiration before swallow Penetration/Aspiration details (thin straw): Material does not  enter airway;Material enters airway, remains ABOVE vocal cords  then ejected out;Material enters airway, passes BELOW cords  without attempt by patient to eject out (silent aspiration) Pharyngeal - Solids Pharyngeal - Puree: Delayed swallow initiation Pharyngeal - Regular: Delayed swallow initiation  Cervical Esophageal Phase    GO    Cervical Esophageal Phase Cervical Esophageal Phase: Impaired Cervical Esophageal Phase - Comment Cervical Esophageal Comment: Appearance of significant residual  in distal esophagus with constant superior and inferior surging  through GE junction. No radiologist present to confirm        [facility], MA CCC-SLP [phone]  [name] 01/11/2012, 1:17 PM      Assessment/Plan:   1. Urosepsis, resolved  2. Bilateral hydronephrosis without renal failure, most likely secondary to noncompliant bladder.  3. History of chronic cystitis and probable noncompliant bladder  I will recommend her getting back on in and out catheterization q.i.d. I think that this will decrease her leakage. Thought might be given to adding an anti-muscarinic i.e. Oxybutynin if catheterization alone does not improve her leakage. I would suggest that she be on this long term, and if she does go to a skilled nursing facility, this be ordered to be done.   LOS: 5 days   Marcine Matar M 01/12/2012, 8:10 AM

## 2012-01-12 NOTE — Progress Notes (Signed)
Attempted to do in and out cath,unsucessful, pt. Very tense and verbalized that this was very painful,MD. Aware.Another attempt will be done after pt. Has some rest.Bladder scan done 147 cc in.keep monitoring pt. Closely and assessing her needs.

## 2012-01-12 NOTE — Progress Notes (Signed)
Speech Language Pathology Dysphagia Treatment Patient Details Name: Katherine Ware MRN: [id number] DOB: 08-15-1940 Today's Date: 01/12/2012 Time: 4098-1191 SLP Time Calculation (min): 24 min  Assessment / Plan / Recommendation Clinical Impression  Treatment session focused on pt education and implementation of water protocol. SLP instructed pt in taking very small sips of thin water to reduce risk of aspriation (as seen in MBS) with water protocol (water following oral care, no other liquids unless thickened, no water with meals or solid foods). Provided verbal and written instruciton. Pt return demonstrated with min verbal cues. Pt verbalized understanding of aspiration risk and verbalized desire to drink water and agreement to reduce risk with strategies and protocol. Discussed increased risk with fatigue and esophageal precautions. SLP will continue to follow for reinforcement    Diet Recommendation  Continue with Current Diet: Dysphagia 2 (fine chop);Nectar-thick liquid    SLP Plan Continue with current plan of care   Pertinent Vitals/Pain NA   Swallowing Goals  SLP Swallowing Goals Patient will consume recommended diet without observed clinical signs of aspiration with: Maximum assistance Swallow Study Goal #1 - Progress: Progressing toward goal Patient will utilize recommended strategies during swallow to increase swallowing safety with: Maximum assistance Swallow Study Goal #2 - Progress: Progressing toward goal  General Temperature Spikes Noted: No Respiratory Status: Room air Behavior/Cognition: Alert;Cooperative Oral Cavity - Dentition: Adequate natural dentition Patient Positioning: Partially reclined  Oral Cavity - Oral Hygiene Does patient have any of the following "at risk" factors?: Lips - dry, cracked;Other - dysphagia;Diet - patient on thickened liquids Patient is AT RISK - Oral Care Protocol followed (see row info): Yes   Dysphagia Treatment Treatment focused on:  Skilled observation of diet tolerance;Patient/family/caregiver education;Utilization of compensatory strategies Family/Caregiver Educated: husband and sister Treatment Methods/Modalities: Skilled observation Patient observed directly with PO's: Yes Type of PO's observed: Thin liquids Feeding: Able to feed self Liquids provided via: Straw Pharyngeal Phase Signs & Symptoms: Suspected delayed swallow initiation Type of cueing: Verbal Amount of cueing: Minimal   GO    Harlon Ditty, MA CCC-SLP [phone]  [name] 01/12/2012, 4:36 PM

## 2012-01-12 NOTE — Progress Notes (Signed)
NUTRITION FOLLOW UP  Intervention:   1. Discontinue Ensure Complete daily 2. Continue Ensure Pudding and aspiration precautions per SLP 3. RD to continue to follow nutrition care plan  Nutrition Dx:   Inadequate oral intake related to acute illness as evidenced by family and caregiver report. Improving.  Goal:   Pt to meet >/= 90% of their estimated nutrition needs. Unmet.  Monitor:   weight trends, lab trends, I/O's, PO intake, supplement tolerance  Assessment:   Urology consulted for hydronephrosis on 12/13 recommending QID catheterizations to assist with regular drainage of bladder.  MBSS on 12/16 with recommendations for Dysphagia 2 diet and nectar-thickened liquids. Noted pt is at high risk for aspiration given acute illness, fatigue and poor positioning in hospital.  Per chart, pt is eating much better yesterday and today. Consuming 75 - 80% of meals. Pt refusing Ensure Complete, will d/c.  Height: Ht Readings from Last 1 Encounters:  01/11/12 4' 9.48" (1.46 m)   Weight Status:   Wt Readings from Last 1 Encounters:  01/11/12 190 lb 12.8 oz (86.546 kg)   Re-estimated needs:  Kcal: 1500 - 1600 kcal Protein: 80 - 95 grams protein Fluid: 1.6 - 1.8 liters daily  Skin: DTI to heels  Diet Order: Dysphagia 2; Nectar Thickened Liquids Supplements: Ensure Complete PO BID, Ensure Pudding PO TID   Intake/Output Summary (Last 24 hours) at 01/12/12 1000 Last data filed at 01/11/12 1300  Gross per 24 hour  Intake    120 ml  Output      0 ml  Net    120 ml   Last BM: 12/15  Labs:   Lab 01/12/12 0545 01/11/12 0730 01/09/12 0700 01/07/12 2010  NA 146* 143 140 --  K 3.9 4.0 3.7 --  CL 108 108 106 --  CO2 26 26 23  --  BUN 13 16 29* --  CREATININE 0.80 0.86 1.15* --  CALCIUM 8.7 8.4 8.5 --  MG -- -- -- 2.4  PHOS -- -- -- --  GLUCOSE 103* 109* 151* --   CBG (last 3)  No results found for this basename: GLUCAP:3 in the last 72 hours  Scheduled Meds:   .  atorvastatin  20 mg Oral Daily  . carbidopa-levodopa  1 tablet Oral TID WC  . ciprofloxacin  500 mg Oral BID  . docusate sodium  100 mg Oral Daily  . feeding supplement  237 mL Oral BID BM  . feeding supplement  1 Container Oral TID BM  . metoprolol succinate  12.5 mg Oral Daily  . multivitamin with minerals  1 tablet Oral Daily  . oxybutynin  5 mg Oral BID  . pantoprazole  40 mg Oral Daily  . sodium chloride  3 mL Intravenous Q12H  . venlafaxine  37.5 mg Oral Q breakfast   Continuous Infusions:   Jarold Motto MS, RD, LDN Pager: [phone] After-hours pager: [phone]

## 2012-01-13 ENCOUNTER — Inpatient Hospital Stay (HOSPITAL_COMMUNITY): Payer: Medicare Other

## 2012-01-13 LAB — CBC
Hemoglobin: 9.8 g/dL — ABNORMAL LOW (ref 12.0–15.0)
MCH: 29.8 pg (ref 26.0–34.0)
RBC: 3.29 MIL/uL — ABNORMAL LOW (ref 3.87–5.11)

## 2012-01-13 NOTE — Progress Notes (Signed)
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 Urology consulted and renal U/S showed bilateral hydronephrosis. Patient also had hematuria. Hematuria has been resolving with antibiotics and Urology following and making recommendations. Patient to get intermittent catheterizations QID. Patient unable to perform intermittent cath by herself and lives with her husband who is not able to do so as well.  .  Assessment/Plan:  . Complicated UTI -  - Blood cultures negative . Urine cultures grew out proteus mirabilis. Placed on cipro - Renal ultrasound shows chronic BL hydronephrosis and also reports No convincing evidence of pyonephrosis of the dilated collecting system.  - Urology recommend intermittent catheterization QID.  - infectious etiology likely causing hematuria. Per urology's note patient has already had cystoscopy and will defer further work-up to them. Currently clearing up with antibiotic .  Leucocytosis Remains afebrile. CXR negative for infection   Progressive weakness with history of progressive supranuclear palsy   follow up with patient's neurologist as outpatient.  Cont sinemet  . Acute Kidney injury  - probably secondary to dehydration poor oral intake and patient is also on Bactrim, lisinopril, diuretics and NSAIDs which could all have further worsened her renal function.  - Improved with rehydration and and cessation of above listed medications.    Anemia -  Stable. monitor daily   Left heel ulcer  - wound consult  Prevalon boots to decrease pressure to BLE . Does not appear infected   Hypertension - continue home medications except for lisinopril.   Code Status: DNR  Family Communication: spoke with patient  at bedside  Disposition Plan: CIR consulted  Consultants:  Urology     Procedures:  none Antibiotics:  Switched to cipro ( day 7 of abx)  HPI/Subjective:  Stable overnight.      Objective: Filed Vitals:   01/12/12 2114 01/13/12 0608 01/13/12 0853 01/13/12 1357  BP: 152/78 136/67 130/62 123/59  Pulse: 98 90 90 100  Temp: 99.3 F (37.4 C) 99 F (37.2 C) 98.2 F (36.8 C) 98.3 F (36.8 C)  TempSrc: Oral Oral Oral Oral  Resp: 18 18 18 18   Height:      Weight: 83.6 kg (184 lb 4.9 oz)     SpO2: 91% 91% 94% 92%    Intake/Output Summary (Last 24 hours) at 01/13/12 1625 Last data filed at 01/13/12 1300  Gross per 24 hour  Intake    600 ml  Output    425 ml  Net    175 ml   Filed Weights   01/10/12 2128 01/11/12 2110 01/12/12 2114  Weight: 87.3 kg (192 lb 7.4 oz) 86.546 kg (190 lb 12.8 oz) 83.6 kg (184 lb 4.9 oz)    Exam:  General:  NAD HEENT: no pallor, moist oral mucosa Cardiovascular: RRR, no murmurs  Respiratory: clear b/l ,  no wheezes, or rhonchi Abdomen: soft, NT, ND Ext: warm, no edema, heel ulcer appears clean.   Data Reviewed: Basic Metabolic Panel:  Lab 01/12/12 6295 01/11/12 0730 01/09/12 0700 01/08/12 0600 01/07/12 2010 01/07/12 1740  NA 146* 143 140 138 -- 134*  K 3.9 4.0 3.7 4.0 -- 4.6  CL 108 108 106 103 -- 95*  CO2 26 26 23 21  -- 24  GLUCOSE 103* 109* 151* 111* -- 111*  BUN 13 16 29* 60* -- 74*  CREATININE 0.80 0.86 1.15* 1.86* --  2.38*  CALCIUM 8.7 8.4 8.5 8.7 -- 9.1  MG -- -- -- -- 2.4 --  PHOS -- -- -- -- -- --   Liver Function Tests: No results found for this basename: AST:5,ALT:5,ALKPHOS:5,BILITOT:5,PROT:5,ALBUMIN:5 in the last 168 hours No results found for this basename: LIPASE:5,AMYLASE:5 in the last 168 hours No results found for this basename: AMMONIA:5 in the last 168 hours CBC:  Lab 01/13/12 0603 01/12/12 0545 01/11/12 0730 01/09/12 0700 01/08/12 0600 01/07/12 1740  WBC 14.5* 13.3* 12.3* 12.1* 12.3* --  NEUTROABS -- -- -- -- -- 11.1*  HGB 9.8* 10.1* 9.8* 9.4* 9.3* --  HCT 30.3* 31.2* 30.6*  28.8* 27.5* --  MCV 92.1 93.1 93.3 90.3 88.7 --  PLT 401* 403* 377 366 316 --   Cardiac Enzymes:  Lab 01/07/12 2008  CKTOTAL --  CKMB --  CKMBINDEX --  TROPONINI <0.30   BNP (last 3 results) No results found for this basename: PROBNP:3 in the last 8760 hours CBG:  Lab 01/08/12 1006  GLUCAP 148*    Recent Results (from the past 240 hour(s))  URINE CULTURE     Status: Normal   Collection Time   01/06/12 12:24 PM      Component Value Range Status Comment   Colony Count >=100,000 COLONIES/ML   Final    Organism ID, Bacteria Multiple bacterial morphotypes present, none   Final    Organism ID, Bacteria predominant. Suggest appropriate recollection if    Final    Organism ID, Bacteria clinically indicated.   Final   URINE CULTURE     Status: Normal   Collection Time   01/07/12  9:18 PM      Component Value Range Status Comment   Specimen Description URINE, CATHETERIZED   Final    Special Requests NONE   Final    Culture  Setup Time 01/08/2012 00:35   Final    Colony Count >=100,000 COLONIES/ML   Final    Culture PROTEUS MIRABILIS   Final    Report Status 01/09/2012 FINAL   Final    Organism ID, Bacteria PROTEUS MIRABILIS   Final   CULTURE, BLOOD (ROUTINE X 2)     Status: Normal (Preliminary result)   Collection Time   01/07/12 11:41 PM      Component Value Range Status Comment   Specimen Description BLOOD HAND RIGHT   Final    Special Requests BOTTLES DRAWN AEROBIC AND ANAEROBIC 10CC   Final    Culture  Setup Time 01/08/2012 04:54   Final    Culture     Final    Value:        BLOOD CULTURE RECEIVED NO GROWTH TO DATE CULTURE WILL BE HELD FOR 5 DAYS BEFORE ISSUING A FINAL NEGATIVE REPORT   Report Status PENDING   Incomplete   CULTURE, BLOOD (ROUTINE X 2)     Status: Normal (Preliminary result)   Collection Time   01/07/12 11:50 PM      Component Value Range Status Comment   Specimen Description BLOOD HAND LEFT   Final    Special Requests BOTTLES DRAWN AEROBIC AND  ANAEROBIC B [facility] R5CC   Final    Culture  Setup Time 01/08/2012 04:54   Final    Culture     Final    Value:        BLOOD CULTURE RECEIVED NO GROWTH TO DATE CULTURE WILL BE HELD FOR 5 DAYS BEFORE ISSUING A FINAL NEGATIVE REPORT   Report Status PENDING  Incomplete      Studies: Dg Chest 2 View  01/13/2012  *RADIOLOGY REPORT*  Clinical Data: Shortness of breath, weakness  CHEST - 2 VIEW  Comparison: 01/07/2012  Findings: Cardiomegaly again noted.  Slight improvement in aeration.  Residual mild interstitial prominence without convincing pulmonary edema.  Elevation of the right hemidiaphragm again noted. No segmental infiltrate.  IMPRESSION: No convincing pulmonary edema.  Cardiomegaly again noted.  No segmental infiltrate.   Original Report Authenticated By: Natasha Mead, M.D.     Scheduled Meds:   . atorvastatin  20 mg Oral Daily  . carbidopa-levodopa  1 tablet Oral TID WC  . ciprofloxacin  500 mg Oral BID  . docusate sodium  100 mg Oral Daily  . feeding supplement  1 Container Oral TID BM  . metoprolol succinate  12.5 mg Oral Daily  . multivitamin with minerals  1 tablet Oral Daily  . oxybutynin  5 mg Oral BID  . pantoprazole  40 mg Oral Daily  . sodium chloride  3 mL Intravenous Q12H  . venlafaxine  37.5 mg Oral Q breakfast   Continuous Infusions:      Time spent: 25 MINUTES    Caia Lofaro  [facility] Pager [phone]. If 8PM-8AM, please contact night-coverage at [email or web address], password [facility] 01/13/2012, 4:25 PM  LOS: 6 days

## 2012-01-13 NOTE — Progress Notes (Signed)
I&O cath completed at 2345, 150cc of cloudly yellow urine with small amount of blood and sediment.  Second I&O completed at 0600, 125cc of cloudy yellow urine with sediment, Tolerated both with minimal discomfort

## 2012-01-13 NOTE — Progress Notes (Signed)
Rehab Admissions Coordinator Note:  Patient was screened by Trish Mage for appropriateness for an Inpatient Acute Rehab Consult.  At this time, we are recommending Inpatient Rehab consult.  Trish Mage 01/13/2012, 4:07 PM  I can be reached at [phone].

## 2012-01-13 NOTE — Clinical Social Work Psychosocial (Signed)
Clinical Social Work Department BRIEF PSYCHOSOCIAL ASSESSMENT 01/13/2012  Patient:  Katherine Ware, Katherine Ware     Account Number:  [id number]     Admit date:  01/07/2012  Clinical Social Worker:  Delmer Islam  Date/Time:  01/13/2012 05:17 AM  Referred by:  Physician  Date Referred:  01/13/2012 Referred for  SNF Placement   Other Referral:   CIR consult   Interview type:  Family Other interview type:    PSYCHOSOCIAL DATA Living Status:  HUSBAND Admitted from facility:   Level of care:   Primary support name:  Katherine Ware Primary support relationship to patient:  SPOUSE Degree of support available:   Husband is very active in patient's day-to-day care.    CURRENT CONCERNS Current Concerns  Post-Acute Placement   Other Concerns:    SOCIAL WORK ASSESSMENT / PLAN CSW and RN care manager Darlyne Russian talked this morning to son Katherine Ware and this afternoon with the family (husband and three sons Katherine Ware, and Katherine Ware) regarding discharge plans. CSW and case manager explained home, SNF and CIR options and answered the family's questions. Currently patient is cared for at home by a private duty aide, the husband, and at times the sons in the evenings. Patient's husband voiced that he can no longer provide the level of care that he has been doing, therefore the option of hiring another person was discussed, along with what that entails. Also discussed was being able to take patient home for Christmas based on where the patient is.    The family will further discuss all the options, make a final decision and follow-up with CSW. CSW explained, listened and offered support during the family meeting.   Assessment/plan status:   Other assessment/ plan:   Information/referral to community resources:   Family given skilled facility lists for [place] and [place].    PATIENT'S/FAMILY'S RESPONSE TO PLAN OF CARE: The family is very supportive of patient and each other and wants what's  best for patient and for Mr. Tancredi. The family was very appreciative of have the opportunity to talk with CSW and case manager at length to discuss discharge options.

## 2012-01-13 NOTE — Progress Notes (Signed)
ANTIBIOTIC CONSULT NOTE - FOLLOW UP  Pharmacy Consult for Cipro Indication: Complicated UTI  Allergies  Allergen Reactions  . Sulfa Antibiotics     unknown    Patient Measurements: Height: 4' 9.48" (146 cm) Weight: 184 lb 4.9 oz (83.6 kg) IBW/kg (Calculated) : 39.7  Adjusted Body Weight:   Vital Signs: Temp: 98.2 F (36.8 C) (12/18 0853) Temp src: Oral (12/18 0853) BP: 130/62 mmHg (12/18 0853) Pulse Rate: 90  (12/18 0853) Intake/Output from previous day: 12/17 0701 - 12/18 0700 In: 720 [P.O.:720] Out: 275 [Urine:275] Intake/Output from this shift: Total I/O In: 120 [P.O.:120] Out: -   Labs:  Basename 01/13/12 0603 01/12/12 0545 01/11/12 0730  WBC 14.5* 13.3* 12.3*  HGB 9.8* 10.1* 9.8*  PLT 401* 403* 377  LABCREA -- -- --  CREATININE -- 0.80 0.86   Estimated Creatinine Clearance: 60 ml/min (by C-G formula based on Cr of 0.8). No results found for this basename: VANCOTROUGH:2,VANCOPEAK:2,VANCORANDOM:2,GENTTROUGH:2,GENTPEAK:2,GENTRANDOM:2,TOBRATROUGH:2,TOBRAPEAK:2,TOBRARND:2,AMIKACINPEAK:2,AMIKACINTROU:2,AMIKACIN:2, in the last 72 hours   Microbiology: Recent Results (from the past 720 hour(s))  URINE CULTURE     Status: Normal   Collection Time   01/06/12 12:24 PM      Component Value Range Status Comment   Colony Count >=100,000 COLONIES/ML   Final    Organism ID, Bacteria Multiple bacterial morphotypes present, none   Final    Organism ID, Bacteria predominant. Suggest appropriate recollection if    Final    Organism ID, Bacteria clinically indicated.   Final   URINE CULTURE     Status: Normal   Collection Time   01/07/12  9:18 PM      Component Value Range Status Comment   Specimen Description URINE, CATHETERIZED   Final    Special Requests NONE   Final    Culture  Setup Time 01/08/2012 00:35   Final    Colony Count >=100,000 COLONIES/ML   Final    Culture PROTEUS MIRABILIS   Final    Report Status 01/09/2012 FINAL   Final    Organism ID, Bacteria  PROTEUS MIRABILIS   Final   CULTURE, BLOOD (ROUTINE X 2)     Status: Normal (Preliminary result)   Collection Time   01/07/12 11:41 PM      Component Value Range Status Comment   Specimen Description BLOOD HAND RIGHT   Final    Special Requests BOTTLES DRAWN AEROBIC AND ANAEROBIC 10CC   Final    Culture  Setup Time 01/08/2012 04:54   Final    Culture     Final    Value:        BLOOD CULTURE RECEIVED NO GROWTH TO DATE CULTURE WILL BE HELD FOR 5 DAYS BEFORE ISSUING A FINAL NEGATIVE REPORT   Report Status PENDING   Incomplete   CULTURE, BLOOD (ROUTINE X 2)     Status: Normal (Preliminary result)   Collection Time   01/07/12 11:50 PM      Component Value Range Status Comment   Specimen Description BLOOD HAND LEFT   Final    Special Requests BOTTLES DRAWN AEROBIC AND ANAEROBIC B [facility] R5CC   Final    Culture  Setup Time 01/08/2012 04:54   Final    Culture     Final    Value:        BLOOD CULTURE RECEIVED NO GROWTH TO DATE CULTURE WILL BE HELD FOR 5 DAYS BEFORE ISSUING A FINAL NEGATIVE REPORT   Report Status PENDING   Incomplete     Anti-infectives  Start     Dose/Rate Route Frequency Ordered Stop   01/12/12 2000   ciprofloxacin (CIPRO) tablet 500 mg        500 mg Oral 2 times daily 01/12/12 1718     01/10/12 1300   ciprofloxacin (CIPRO) tablet 500 mg  Status:  Discontinued        500 mg Oral 2 times daily 01/10/12 1223 01/12/12 1659   01/10/12 0400   vancomycin (VANCOCIN) 1,500 mg in sodium chloride 0.9 % 500 mL IVPB  Status:  Discontinued        1,500 mg 250 mL/hr over 120 Minutes Intravenous Every 24 hours 01/09/12 1438 01/10/12 1129   01/09/12 2200   vancomycin (VANCOCIN) IVPB 1000 mg/200 mL premix  Status:  Discontinued        1,000 mg 200 mL/hr over 60 Minutes Intravenous  Once 01/08/12 0105 01/08/12 0944   01/09/12 2000   imipenem-cilastatin (PRIMAXIN) 500 mg in sodium chloride 0.9 % 100 mL IVPB  Status:  Discontinued        500 mg 200 mL/hr over 30 Minutes Intravenous  Every 8 hours 01/09/12 1436 01/10/12 1129   01/09/12 0400   vancomycin (VANCOCIN) 750 mg in sodium chloride 0.9 % 150 mL IVPB  Status:  Discontinued        750 mg 150 mL/hr over 60 Minutes Intravenous Every 24 hours 01/08/12 0945 01/09/12 1438   01/08/12 1500   imipenem-cilastatin (PRIMAXIN) 250 mg in sodium chloride 0.9 % 100 mL IVPB  Status:  Discontinued        250 mg 200 mL/hr over 30 Minutes Intravenous Every 6 hours 01/08/12 0945 01/09/12 1436   01/08/12 0200   vancomycin (VANCOCIN) 1,250 mg in sodium chloride 0.9 % 250 mL IVPB        1,250 mg 166.7 mL/hr over 90 Minutes Intravenous  Once 01/08/12 0105 01/08/12 0507   01/08/12 0200   imipenem-cilastatin (PRIMAXIN) 250 mg in sodium chloride 0.9 % 100 mL IVPB  Status:  Discontinued        250 mg 200 mL/hr over 30 Minutes Intravenous Every 12 hours 01/08/12 0105 01/08/12 0945   01/07/12 2130   cefTRIAXone (ROCEPHIN) 1 g in dextrose 5 % 50 mL IVPB        1 g 100 mL/hr over 30 Minutes Intravenous  Once 01/07/12 2124 01/07/12 2226          Assessment: 71yof on day #4 Cipro (day #6 antibiotics) for complicated Proteus UTI (pan sensitive). Patient is afebrile and WBC 14.5. Patient is tolerating oral meds.  Renal function improving with estimated crcl of 45ml/min. For complicated UTI, treatment duration is 7-14 days.   Plan:  1. Continue cipro 500mg  po BID 2. Follow-up clinical status, renal function and LOT  Thank you,  Brett Fairy, PharmD, BCPS 01/13/2012 9:22 AM

## 2012-01-13 NOTE — Progress Notes (Signed)
NCM and SW met with patient's son to discuss discharge planning options, home vs SNF.  Plan to meet with rest of family and spouse later today.

## 2012-01-13 NOTE — Progress Notes (Signed)
Physical Therapy Evaluation Patient Details Name: Katherine Ware MRN: [id number] DOB: 1940-11-02 Today's Date: 01/13/2012 Time: 4098-1191 PT Time Calculation (min): 32 min  PT Assessment / Plan / Recommendation Clinical Impression  Pt is 71 yo female with supranuclear palsy and UTI who presents with decline in function from her normal ability to transfer in and out of bed with min/ mod A. Realize that pt's function in expected to decline in the olong term, however, this therapist feels that her current status is worsened by multiple days in the bed and that it is likely that she can improve back to transfer level.  Recommend CIR consult. PT will follow    PT Assessment  Patient needs continued PT services    Follow Up Recommendations  CIR;Supervision/Assistance - 24 hour    Does the patient have the potential to tolerate intense rehabilitation      Barriers to Discharge Decreased caregiver support has 24 hr care between family and caregivers, but may need more time with skilled caregivers    Equipment Recommendations  None recommended by PT    Recommendations for Other Services OT consult;Rehab consult   Frequency Min 3X/week    Precautions / Restrictions Precautions Precautions: Fall Restrictions Weight Bearing Restrictions: No   Pertinent Vitals/Pain No c/o pain      Mobility  Bed Mobility Bed Mobility: Rolling Right;Right Sidelying to Sit;Sit to Supine;Sitting - Scoot to [place] of Bed Rolling Right: 2: Max assist Right Sidelying to Sit: 2: Max assist Sitting - Scoot to [place] of Bed: 2: Max assist Sit to Supine: 1: +2 Total assist Sit to Supine: Patient Percentage: 10% Details for Bed Mobility Assistance: pt needs max assist to bridge knees due to rigidity, expect that this is partially pre-existing but worsened by past 6+ days in the bed.  Transfers Transfers: Sit to Stand;Stand to Sit Sit to Stand: 1: +2 Total assist;From bed;With upper extremity assist Sit to Stand:  Patient Percentage: 30% Stand to Sit: 1: +2 Total assist;To bed;With upper extremity assist Stand to Sit: Patient Percentage: 30% Details for Transfer Assistance: bilateral feet and knees blocked, pt with minimal clearance of buttocks off bed. Performed 2x. Pt very motivate to be OOB. Unable to step feet today and fatigued very quickly. Maintanined standing x1 min each time Ambulation/Gait Ambulation/Gait Assistance: Not tested (comment) (unable) Stairs: No Wheelchair Mobility Wheelchair Mobility: No    Shoulder Instructions     Exercises General Exercises - Lower Extremity Long Arc Quad: AROM;Both;10 reps;Seated   PT Diagnosis: Generalized weakness  PT Problem List: Decreased strength;Decreased range of motion;Decreased activity tolerance;Decreased balance;Decreased mobility;Decreased coordination;Impaired sensation PT Treatment Interventions: Therapeutic activities;Therapeutic exercise;Functional mobility training;Balance training;Patient/family education;Neuromuscular re-education   PT Goals Acute Rehab PT Goals PT Goal Formulation: With patient/family Time For Goal Achievement: 01/27/12 Potential to Achieve Goals: Fair Pt will go Supine/Side to Sit: with mod assist PT Goal: Supine/Side to Sit - Progress: Goal set today Pt will Sit at Edge of Bed: with supervision;6-10 min;with bilateral upper extremity support PT Goal: Sit at Edge Of Bed - Progress: Goal set today Pt will go Sit to Supine/Side: with mod assist PT Goal: Sit to Supine/Side - Progress: Goal set today Pt will go Sit to Stand: with mod assist PT Goal: Sit to Stand - Progress: Goal set today Pt will go Stand to Sit: with mod assist PT Goal: Stand to Sit - Progress: Goal set today Pt will Transfer Bed to Chair/Chair to Bed: with mod assist PT Transfer Goal: Bed to  Chair/Chair to Bed - Progress: Goal set today  Visit Information  Last PT Received On: 01/13/12 Assistance Needed: +2    Subjective Data  Subjective:  pt want to try to stand up Patient Stated Goal: return home   Prior Functioning  Home Living Lives With: Spouse Available Help at Discharge: Family;Personal care attendant;Available 24 hours/day Type of Home: House Bathroom Shower/Tub:  (does not shower anymore) Bathroom Toilet: Handicapped height Bathroom Accessibility: Yes How Accessible: Accessible via walker;Accessible via wheelchair Home Adaptive Equipment: Dan Humphreys - four wheeled;Wheelchair - Architectural technologist with back (lift chair, hoyer (have not used)) Additional Comments: pt's husband and son help her in the morning and evening and she has a caregiver that comes and helps during the day Prior Function Level of Independence: Needs assistance Needs Assistance: Bathing;Dressing;Grooming;Toileting;Meal Prep;Light Housekeeping;Gait;Transfers Bath: Moderate (sponge baths) Toileting: Moderate Meal Prep: Total Light Housekeeping: Total Gait Assistance: has not ambulated in several months Transfer Assistance: min A for transfer from bed to chair Able to Take Stairs?: No Driving: No Vocation: Retired Comments: pt was transferring with min A up until a few days before she came in Communication Communication: No difficulties    Cognition  Overall Cognitive Status: History of cognitive impairments - at baseline Arousal/Alertness: Awake/alert Orientation Level: Appears intact for tasks assessed Behavior During Session: [facility] for tasks performed Cognition - Other Comments: pt has Parkinsons so response times are delayed but pt is appropriate and follows conversation    Extremity/Trunk Assessment Right Upper Extremity Assessment RUE ROM/Strength/Tone: Deficits RUE ROM/Strength/Tone Deficits: shoulder flexion 2/5 Left Upper Extremity Assessment LUE ROM/Strength/Tone: Deficits LUE ROM/Strength/Tone Deficits: shoulder flexion 2/5 Right Lower Extremity Assessment RLE ROM/Strength/Tone: Deficits RLE ROM/Strength/Tone Deficits: hip  flex 2-/5, knee ext 2/5, ankle df 2/5 RLE Sensation: Deficits RLE Sensation Deficits: decreased to lt touch, pt with sores on bilateral heels RLE Coordination: Deficits RLE Coordination Deficits: pt with increased rigidity LE's affecting mvmt and coordination Left Lower Extremity Assessment LLE ROM/Strength/Tone: Deficits LLE ROM/Strength/Tone Deficits: same as RLE LLE Sensation: Deficits LLE Sensation Deficits: same as RLE LLE Coordination: Deficits LLE Coordination Deficits: same as RLE Trunk Assessment Trunk Assessment: Kyphotic (fixed position)   Balance Balance Balance Assessed: Yes Static Sitting Balance Static Sitting - Balance Support: Bilateral upper extremity supported;Feet unsupported Static Sitting - Level of Assistance: 4: Min assist;3: Mod assist Static Sitting - Comment/# of Minutes: pt sat EOB x 12 mins, first with min A and then began to lean to right and needed mod A to return to midline. Howvever, after standing, pt was closer to the EOB and not on rail of bed and was able to sit with supervision. Sits with flexed trunk and neck.  End of Session PT - End of Session Activity Tolerance: Patient limited by fatigue Patient left: in bed;with call bell/phone within reach;with family/visitor present Nurse Communication: Mobility status  GP   Lyanne Co, PT  Acute Rehab Services  [phone]   Lyanne Co 01/13/2012, 3:55 PM

## 2012-01-14 DIAGNOSIS — R5381 Other malaise: Secondary | ICD-10-CM

## 2012-01-14 DIAGNOSIS — G238 Other specified degenerative diseases of basal ganglia: Secondary | ICD-10-CM

## 2012-01-14 DIAGNOSIS — R29898 Other symptoms and signs involving the musculoskeletal system: Secondary | ICD-10-CM

## 2012-01-14 LAB — CULTURE, BLOOD (ROUTINE X 2): Culture: NO GROWTH

## 2012-01-14 LAB — BASIC METABOLIC PANEL
BUN: 11 mg/dL (ref 6–23)
CO2: 27 mEq/L (ref 19–32)
Chloride: 105 mEq/L (ref 96–112)
Creatinine, Ser: 0.7 mg/dL (ref 0.50–1.10)

## 2012-01-14 LAB — CBC
HCT: 31.7 % — ABNORMAL LOW (ref 36.0–46.0)
MCV: 92.7 fL (ref 78.0–100.0)
RBC: 3.42 MIL/uL — ABNORMAL LOW (ref 3.87–5.11)
RDW: 13.7 % (ref 11.5–15.5)
WBC: 13.9 10*3/uL — ABNORMAL HIGH (ref 4.0–10.5)

## 2012-01-14 NOTE — Consult Note (Signed)
Physical Medicine and Rehabilitation Consult Reason for Consult: Progressive super nuclear palsy/UTI Referring Physician: [facility]   HPI: Katherine Ware is a 71 y.o. 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 Dan Humphreys at [facility]. Patient lives with her husband and has a  personal care attendant to assist with ADLs and mobility. Admitted 01/07/2012 with fever of 103 and chills x2-3 days as well his dysuria and difficulty voiding. Patient's dysuria and urinary difficulties have begun off for the last few weeks and followed by urologist with CT abdomen done last month which showed bilateral hydronephrosis with hydroureter and felt to be having bladder outlet obstruction versus neurogenic bladder. Urinalysis study showed Proteus and placed on broad-spectrum antibiotics. Followup neurology services Dr. Retta Diones in recommended intermittent catheterizations 4 times a day. Hospital course followup per wound care nurse for bilateral heel pressure ulcers that were present on admission with wound care as directed. Physical therapy evaluation completed 01/13/2012 with noted progressive decline in function and recommended physical medicine rehabilitation consult to consider inpatient rehabilitation services.   Review of Systems  Gastrointestinal: Positive for constipation.  Genitourinary: Positive for dysuria, urgency and hematuria.  Musculoskeletal: Positive for myalgias.  Neurological: Positive for dizziness, tingling and weakness.  Psychiatric/Behavioral: Positive for depression. The patient has insomnia.   All other systems reviewed and are negative.   Past Medical History  Diagnosis Date  . Hyperlipidemia   . Hypertension   . Asthma   . Hypokalemia   . Osteoporosis   . Osteoarthritis   . 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 Pearlean Brownie  . Parkinsonian syndrome     saw Darrol Angel NP   . Progressive supranuclear palsy      sees Dr. Dan Humphreys at [facility]   . Neurogenic bladder     sees Dr. Retta Diones   . Frequent UTI    Past Surgical History  Procedure Date  . Cholecystectomy   . Breast surgery     benign left breast cyst  . Knee surgery     left knee  . Carpal tunnel release     bilateral  . Epidural steroid     to lumbar spine 8-11 and 9-11  . Esophageal dilation     per Dr. Yancey Flemings   . Colonscopy 2010    per Dr. Marina Goodell, benign polyps, repeat 3 years   . Esophageal dilation     dr  Marina Goodell   . Tubal ligation   . Benign  right breat    Family History  Problem Relation Age of Onset  . Cancer Mother   . Diabetes Mother   . Arthritis Father   . Heart disease Father   . Hypertension Father   . Stroke Father   . Diabetes Sister   . Diabetes Maternal Grandmother    Social History:  reports that she has never smoked. She has never used smokeless tobacco. She reports that she does not drink alcohol or use illicit drugs. Allergies:  Allergies  Allergen Reactions  . Sulfa Antibiotics     unknown   Medications Prior to Admission  Medication Sig Dispense Refill  . acetaminophen (TYLENOL) 500 MG tablet Take 500 mg by mouth every 6 (six) hours as needed.      Marland Kitchen aspirin 81 MG tablet Take 81 mg by mouth daily.      Marland Kitchen atorvastatin (LIPITOR) 20 MG tablet Take 1 tablet (20 mg total) by mouth  daily.  90 tablet  3  . Calcium Carb-Vit D-Soy Isoflav 600-200-25 MG-UNIT-MG TABS daily  30 each  0  . carbidopa-levodopa (SINEMET IR) 25-100 MG per tablet Take 1 tablet by mouth 3 (three) times daily.  270 tablet  3  . Cholecalciferol (VITAMIN D) 2000 UNITS CAPS Take by mouth.        . clopidogrel (PLAVIX) 75 MG tablet Take 1 tablet (75 mg total) by mouth daily.  90 tablet  3  . diclofenac sodium (VOLTAREN) 1 % GEL Apply 2 g topically 2 (two) times daily.  400 g  3  . docusate sodium (COLACE) 100 MG capsule Take 100 mg by mouth daily.      Marland Kitchen doxycycline (VIBRAMYCIN) 100 MG capsule Take 100 mg by mouth  daily.      . hydrochlorothiazide (HYDRODIURIL) 25 MG tablet Take 1 tablet (25 mg total) by mouth daily.  90 tablet  3  . HYDROcodone-acetaminophen (NORCO/VICODIN) 5-325 MG per tablet Take 1 tablet by mouth every 4 (four) hours as needed for pain.  540 tablet  1  . ibuprofen (ADVIL,MOTRIN) 200 MG tablet Take 200 mg by mouth every 6 (six) hours as needed. For pain      . lisinopril (PRINIVIL,ZESTRIL) 20 MG tablet Take 1 tablet (20 mg total) by mouth daily.  90 tablet  3  . metoprolol succinate (TOPROL-XL) 25 MG 24 hr tablet Take 1 tablet (25 mg total) by mouth daily.  90 tablet  3  . Multiple Vitamin (MULTIVITAMIN) tablet Take 1 tablet by mouth daily.      Marland Kitchen omeprazole (PRILOSEC) 20 MG capsule Take 20 mg by mouth daily.      Marland Kitchen oxybutynin (DITROPAN) 5 MG tablet Take 1 tablet (5 mg total) by mouth 2 (two) times daily.  180 tablet  3  . sulfamethoxazole-trimethoprim (BACTRIM DS,SEPTRA DS) 800-160 MG per tablet Take 1 tablet by mouth 2 (two) times daily.  20 tablet  0  . venlafaxine (EFFEXOR) 37.5 MG tablet Take 1 tablet (37.5 mg total) by mouth daily.  90 tablet  3  . omeprazole (PRILOSEC) 20 MG capsule Take 20 mg by mouth daily.          Home: Home Living Lives With: Spouse Available Help at Discharge: Family;Personal care attendant;Available 24 hours/day Type of Home: House Bathroom Shower/Tub:  (does not shower anymore) Bathroom Toilet: Handicapped height Bathroom Accessibility: Yes How Accessible: Accessible via walker;Accessible via wheelchair Home Adaptive Equipment: Dan Humphreys - four wheeled;Wheelchair - Architectural technologist with back (lift chair, hoyer (have not used)) Additional Comments: pt's husband and son help her in the morning and evening and she has a caregiver that comes and helps during the day  Functional History: Prior Function Bath: Moderate (sponge baths) Toileting: Moderate Meal Prep: Total Light Housekeeping: Total Able to Take Stairs?: No Driving: No Vocation:  Retired Comments: pt was transferring with min A up until a few days before she came in Functional Status:  Mobility: Bed Mobility Bed Mobility: Rolling Right;Right Sidelying to Sit;Sit to Supine;Sitting - Scoot to Edge of Bed Rolling Right: 2: Max assist Right Sidelying to Sit: 2: Max assist Sitting - Scoot to [place] of Bed: 2: Max assist Sit to Supine: 1: +2 Total assist Sit to Supine: Patient Percentage: 10% Transfers Transfers: Sit to Stand;Stand to Sit Sit to Stand: 1: +2 Total assist;From bed;With upper extremity assist Sit to Stand: Patient Percentage: 30% Stand to Sit: 1: +2 Total assist;To bed;With upper extremity assist Stand to Sit: Patient  Percentage: 30% Ambulation/Gait Ambulation/Gait Assistance: Not tested (comment) (unable) Stairs: No Wheelchair Mobility Wheelchair Mobility: No  ADL:    Cognition: Cognition Arousal/Alertness: Awake/alert Orientation Level: Oriented X4 Cognition Overall Cognitive Status: History of cognitive impairments - at baseline Arousal/Alertness: Awake/alert Orientation Level: Appears intact for tasks assessed Behavior During Session: [facility] for tasks performed Cognition - Other Comments: pt has Parkinsons so response times are delayed but pt is appropriate and follows conversation  Blood pressure 146/48, pulse 100, temperature 99 F (37.2 C), temperature source Oral, resp. rate 18, height 4' 9.48" (1.46 m), weight 82.5 kg (181 lb 14.1 oz), SpO2 93.00%. Physical Exam  Vitals reviewed. HENT:  Head: Normocephalic.  Eyes:       Pupils round and reactive to light  Neck: Normal range of motion. Neck supple. No thyromegaly present.  Cardiovascular: Normal rate and regular rhythm.   Pulmonary/Chest: Effort normal and breath sounds normal. She has no wheezes.  Abdominal: Soft. Bowel sounds are normal. She exhibits no distension. There is no tenderness.  Neurological: She is alert.       Mood is flat but appropriate. Masked facies. She does  not initiate much conversation. She is oriented to person place and date of birth. Moves all 4 but 1-2/5. No gross sensory loss. dtr's trace.   Skin:       Prevalon  boots in place to bilateral heels secondary heel ulcers with no odor    No results found for this or any previous visit (from the past 24 hour(s)). Dg Chest 2 View  01/13/2012  *RADIOLOGY REPORT*  Clinical Data: Shortness of breath, weakness  CHEST - 2 VIEW  Comparison: 01/07/2012  Findings: Cardiomegaly again noted.  Slight improvement in aeration.  Residual mild interstitial prominence without convincing pulmonary edema.  Elevation of the right hemidiaphragm again noted. No segmental infiltrate.  IMPRESSION: No convincing pulmonary edema.  Cardiomegaly again noted.  No segmental infiltrate.   Original Report Authenticated By: Natasha Mead, M.D.     Assessment/Plan: Diagnosis: SNP with recent decline, partially related to UTI/hydronephrosis 1. Does the need for close, 24 hr/day medical supervision in concert with the patient's rehab needs make it unreasonable for this patient to be served in a less intensive setting? Yes 2. Co-Morbidities requiring supervision/potential complications: ARF, GERD, heel ulcers 3. Due to bladder management, bowel management, safety, skin/wound care, disease management, medication administration, pain management and patient education, does the patient require 24 hr/day rehab nursing? Yes 4. Does the patient require coordinated care of a physician, rehab nurse, PT (1-2 hrs/day, 5 days/week), OT (1-2 hrs/day, 5 days/week) and SLP (1-2 hrs/day, 5 days/week) to address physical and functional deficits in the context of the above medical diagnosis(es)? Yes Addressing deficits in the following areas: balance, endurance, locomotion, strength, transferring, bowel/bladder control, bathing, dressing, feeding, grooming, toileting, cognition, speech, language, swallowing and psychosocial support 5. Can the patient  actively participate in an intensive therapy program of at least 3 hrs of therapy per day at least 5 days per week? Potentially 6. The potential for patient to make measurable gains while on inpatient rehab is good 7. Anticipated functional outcomes upon discharge from inpatient rehab are mod assist with PT, mod to max assist with OT, min assist with SLP. 8. Estimated rehab length of stay to reach the above functional goals is: 2 weeks 9. Does the patient have adequate social supports to accommodate these discharge functional goals? Yes 10. Anticipated D/C setting: Home 11. Anticipated post D/C treatments: HH therapy 12. Overall  Rehab/Functional Prognosis: good  RECOMMENDATIONS: This patient's condition is appropriate for continued rehabilitative care in the following setting: CIR Patient has agreed to participate in recommended program. Yes Note that insurance prior authorization may be required for reimbursement for recommended care.  Comment: Pt and husband both would like to pursue inpatient rehab. My feeling is that a lot of the focus may be family education, equipment training, etc. Rehab RN to follow up.   Ivory Broad, MD     01/14/2012

## 2012-01-14 NOTE — Progress Notes (Signed)
Met w/ pt, her husband, daughter-in-law & caregiver to discuss CIR.  Family not yet ready to say what they want for d/c plan.  Will let me know tomorrow.  Will f/up.  [phone]

## 2012-01-14 NOTE — Progress Notes (Signed)
I&O performed, pt premedicated with 1 vicodin 5-325. Yielded 320 ml clear yellow urine with small amount of blood tinged sediment. Pt tolerated procedure well. Will continue to monitor.

## 2012-01-14 NOTE — Progress Notes (Signed)
Pt complaining of feeling hot, pt temp low grade fever. Heat in room turned down, cold compress applied to forehead. Will continue to monitor.

## 2012-01-14 NOTE — Progress Notes (Signed)
Second I&O cath done, yielded 300 ml yellow, sediment/blood tinged urine. Pt tolerated procedure well, premedicated with 1 5-325 vicodin prior to procedure. Will continue to monitor

## 2012-01-14 NOTE — Progress Notes (Signed)
While in to do I&O cath, noticed LUE edema worsening, arm band had become "embeded" in pt arm. Removed armband, elevated arm on pillows. Will continue to monitor.

## 2012-01-14 NOTE — Progress Notes (Signed)
In and out cath was performed,after pre-medicated pt. Pt. Tolerated well,althought she was having a lot of discomfort.300 cc were obtained of yellow,claudy urine with sediments.keep monitoring pt. Closely and assessing her needs.

## 2012-01-14 NOTE — Progress Notes (Signed)
[facility] PROGRESS NOTE  Katherine Ware AOZ:[id number] DOB: Jul 18, 1940 DOA: 01/07/2012 PCP: Nelwyn Salisbury, MD  Brief Narrative:  71 y/o with progressive supranuclear palsy who presented with fevers, dysuria, and chills. Urology consulted and renal U/S showed bilateral hydronephrosis. Patient also had hematuria. Hematuria has been resolving with antibiotics and Urology following and making recommendations. Patient to get intermittent catheterizations QID. Patient unable to perform intermittent cath by herself and lives with her husband who is not able to do so as well.  .  Assessment/Plan:  . Complicated UTI -  - Blood cultures negative . Urine cultures grew out proteus mirabilis. Placed on cipro ( day 8 of abx. Will treat for 14 days) - Renal ultrasound shows chronic BL hydronephrosis and also reports No convincing evidence of pyonephrosis of the dilated collecting system.  - Urology recommend intermittent catheterization QID.  - infectious etiology likely causing hematuria. Per urology's note patient has already had cystoscopy and will defer further work-up to them. Currently clearing up with antibiotic .   Leucocytosis  Remains afebrile. CXR negative for infection   Progressive weakness with history of progressive supranuclear palsy  follow up with patient's neurologist as outpatient.  Continue  sinemet   . Acute Kidney injury  - probably secondary to dehydration poor oral intake and patient is also on Bactrim, lisinopril, diuretics and NSAIDs which could all have further worsened her renal function.  - Improved with hydration  and cessation of above listed medications.   Anemia -  Stable. monitor daily   Left heel ulcer  - wound consult Prevalon boots to decrease pressure to BLE . Does not appear infected   Hypertension - continue home medications except for lisinopril.   Code Status: DNR  Family Communication: spoke with patient at bedside  Disposition Plan: CIR consulted  . Awaiting bed  Consultants:  Urology   Procedures:  none Antibiotics:  Switched to cipro ( day 8 of abx)  HPI/Subjective: No overnight issues.  Objective: Filed Vitals:   01/13/12 1357 01/13/12 2131 01/14/12 0523 01/14/12 0944  BP: 123/59 148/68 146/48 134/64  Pulse: 100 87 100 86  Temp: 98.3 F (36.8 C) 98.4 F (36.9 C) 99 F (37.2 C) 97.5 F (36.4 C)  TempSrc: Oral Oral Oral Oral  Resp: 18 18 18 20   Height:      Weight:  82.5 kg (181 lb 14.1 oz)    SpO2: 92% 94% 93% 97%    Intake/Output Summary (Last 24 hours) at 01/14/12 1343 Last data filed at 01/14/12 0900  Gross per 24 hour  Intake    323 ml  Output    770 ml  Net   -447 ml   Filed Weights   01/11/12 2110 01/12/12 2114 01/13/12 2131  Weight: 86.546 kg (190 lb 12.8 oz) 83.6 kg (184 lb 4.9 oz) 82.5 kg (181 lb 14.1 oz)    Exam:  General: NAD  HEENT: no pallor, moist oral mucosa  Cardiovascular: RRR, no murmurs  Respiratory: clear b/l , no wheezes, or rhonchi  Abdomen: soft, NT, ND  Ext: warm, no edema, heel ulcer appears clean.    Data Reviewed: Basic Metabolic Panel:  Lab 01/14/12 9629 01/12/12 0545 01/11/12 0730 01/09/12 0700 01/08/12 0600 01/07/12 2010  NA 142 146* 143 140 138 --  K 3.9 3.9 4.0 3.7 4.0 --  CL 105 108 108 106 103 --  CO2 27 26 26 23 21  --  GLUCOSE 114* 103* 109* 151* 111* --  BUN 11 13 16  29* 60* --  CREATININE 0.70 0.80 0.86 1.15* 1.86* --  CALCIUM 8.7 8.7 8.4 8.5 8.7 --  MG -- -- -- -- -- 2.4  PHOS -- -- -- -- -- --   Liver Function Tests: No results found for this basename: AST:5,ALT:5,ALKPHOS:5,BILITOT:5,PROT:5,ALBUMIN:5 in the last 168 hours No results found for this basename: LIPASE:5,AMYLASE:5 in the last 168 hours No results found for this basename: AMMONIA:5 in the last 168 hours CBC:  Lab 01/14/12 0610 01/13/12 0603 01/12/12 0545 01/11/12 0730 01/09/12 0700 01/07/12 1740  WBC 13.9* 14.5* 13.3* 12.3* 12.1* --  NEUTROABS -- -- -- -- -- 11.1*  HGB 10.2* 9.8*  10.1* 9.8* 9.4* --  HCT 31.7* 30.3* 31.2* 30.6* 28.8* --  MCV 92.7 92.1 93.1 93.3 90.3 --  PLT 372 401* 403* 377 366 --   Cardiac Enzymes:  Lab 01/07/12 2008  CKTOTAL --  CKMB --  CKMBINDEX --  TROPONINI <0.30   BNP (last 3 results) No results found for this basename: PROBNP:3 in the last 8760 hours CBG:  Lab 01/08/12 1006  GLUCAP 148*    Recent Results (from the past 240 hour(s))  URINE CULTURE     Status: Normal   Collection Time   01/06/12 12:24 PM      Component Value Range Status Comment   Colony Count >=100,000 COLONIES/ML   Final    Organism ID, Bacteria Multiple bacterial morphotypes present, none   Final    Organism ID, Bacteria predominant. Suggest appropriate recollection if    Final    Organism ID, Bacteria clinically indicated.   Final   URINE CULTURE     Status: Normal   Collection Time   01/07/12  9:18 PM      Component Value Range Status Comment   Specimen Description URINE, CATHETERIZED   Final    Special Requests NONE   Final    Culture  Setup Time 01/08/2012 00:35   Final    Colony Count >=100,000 COLONIES/ML   Final    Culture PROTEUS MIRABILIS   Final    Report Status 01/09/2012 FINAL   Final    Organism ID, Bacteria PROTEUS MIRABILIS   Final   CULTURE, BLOOD (ROUTINE X 2)     Status: Normal   Collection Time   01/07/12 11:41 PM      Component Value Range Status Comment   Specimen Description BLOOD HAND RIGHT   Final    Special Requests BOTTLES DRAWN AEROBIC AND ANAEROBIC 10CC   Final    Culture  Setup Time 01/08/2012 04:54   Final    Culture NO GROWTH 5 DAYS   Final    Report Status 01/14/2012 FINAL   Final   CULTURE, BLOOD (ROUTINE X 2)     Status: Normal   Collection Time   01/07/12 11:50 PM      Component Value Range Status Comment   Specimen Description BLOOD HAND LEFT   Final    Special Requests BOTTLES DRAWN AEROBIC AND ANAEROBIC B [facility] R5CC   Final    Culture  Setup Time 01/08/2012 04:54   Final    Culture NO GROWTH 5 DAYS   Final     Report Status 01/14/2012 FINAL   Final      Studies: Dg Chest 2 View  01/13/2012  *RADIOLOGY REPORT*  Clinical Data: Shortness of breath, weakness  CHEST - 2 VIEW  Comparison: 01/07/2012  Findings: Cardiomegaly again noted.  Slight improvement in aeration.  Residual mild interstitial prominence without  convincing pulmonary edema.  Elevation of the right hemidiaphragm again noted. No segmental infiltrate.  IMPRESSION: No convincing pulmonary edema.  Cardiomegaly again noted.  No segmental infiltrate.   Original Report Authenticated By: Natasha Mead, M.D.     Scheduled Meds:   . atorvastatin  20 mg Oral Daily  . carbidopa-levodopa  1 tablet Oral TID WC  . ciprofloxacin  500 mg Oral BID  . docusate sodium  100 mg Oral Daily  . feeding supplement  1 Container Oral TID BM  . metoprolol succinate  12.5 mg Oral Daily  . multivitamin with minerals  1 tablet Oral Daily  . oxybutynin  5 mg Oral BID  . pantoprazole  40 mg Oral Daily  . sodium chloride  3 mL Intravenous Q12H  . venlafaxine  37.5 mg Oral Q breakfast   Continuous Infusions:     Time spent:25 minutes    Osby Sweetin  [facility] Pager [phone] If 8PM-8AM, please contact night-coverage at [email or web address], password [email or web address] 01/14/2012, 1:43 PM  LOS: 7 days

## 2012-01-15 ENCOUNTER — Telehealth: Payer: Self-pay | Admitting: Family Medicine

## 2012-01-15 DIAGNOSIS — N1330 Unspecified hydronephrosis: Secondary | ICD-10-CM | POA: Diagnosis present

## 2012-01-15 DIAGNOSIS — I10 Essential (primary) hypertension: Secondary | ICD-10-CM | POA: Diagnosis present

## 2012-01-15 DIAGNOSIS — R339 Retention of urine, unspecified: Secondary | ICD-10-CM | POA: Diagnosis present

## 2012-01-15 MED ORDER — ENSURE PUDDING PO PUDG: 1.0000 | Freq: Three times a day (TID) | ORAL | Status: DC

## 2012-01-15 MED ORDER — HYDROCODONE-ACETAMINOPHEN 5-500 MG PO TABS: 1.0000 | ORAL_TABLET | Freq: Four times a day (QID) | ORAL | Status: DC | PRN

## 2012-01-15 MED ORDER — CIPROFLOXACIN HCL 500 MG PO TABS: 500.0000 mg | ORAL_TABLET | Freq: Two times a day (BID) | ORAL | Status: DC

## 2012-01-15 NOTE — Progress Notes (Signed)
Speech Language Pathology Dysphagia Treatment Patient Details Name: Katherine Ware MRN: [id number] DOB: 11-22-1940 Today's Date: 01/15/2012 Time: 4098-1191 SLP Time Calculation (min): 25 min  Assessment / Plan / Recommendation Clinical Impression  Treatment session fucused on reinforcement of compensatory strategies for energy conservation, oral care protocol, diet texture. Pt verbalized that ths is not eating much because she doesnt like the texture of foods. Husband reports that she cannot chew harder foods. Caregiveer bring pt fruit and soft foods. Educated caregiver in appropriate textures and feeding strategies. Pt verbalized oral care protocol with min question cues x3. No evidence of overt aspiration, pt independently taking small sips. Will continue to follow for education and diet modification as needed.     Diet Recommendation  Continue with Current Diet: Dysphagia 2 (fine chop);Nectar-thick liquid    SLP Plan Continue with current plan of care   Pertinent Vitals/Pain NA   Swallowing Goals  SLP Swallowing Goals Patient will consume recommended diet without observed clinical signs of aspiration with: Maximum assistance Swallow Study Goal #1 - Progress: Progressing toward goal Patient will utilize recommended strategies during swallow to increase swallowing safety with: Maximum assistance Swallow Study Goal #2 - Progress: Progressing toward goal  General Temperature Spikes Noted: No Respiratory Status: Room air Behavior/Cognition: Alert;Cooperative Oral Cavity - Dentition: Adequate natural dentition Patient Positioning: Upright in bed  Oral Cavity - Oral Hygiene Does patient have any of the following "at risk" factors?: Diet - patient on thickened liquids;Other - dysphagia Patient is AT RISK - Oral Care Protocol followed (see row info): Yes   Dysphagia Treatment Treatment focused on: Skilled observation of diet tolerance;Patient/family/caregiver education;Utilization of  compensatory strategies Family/Caregiver Educated: husband and caregiver Treatment Methods/Modalities: Skilled observation Patient observed directly with PO's: Yes Type of PO's observed: Thin liquids;Dysphagia 2 (chopped) Feeding: Needs assist Liquids provided via: Straw Oral Phase Signs & Symptoms: Prolonged oral phase;Prolonged mastication Pharyngeal Phase Signs & Symptoms: Suspected delayed swallow initiation Type of cueing: Verbal Amount of cueing: Minimal   GO    Harlon Ditty, MA CCC-SLP [phone]  [name] 01/15/2012, 9:50 AM

## 2012-01-15 NOTE — Telephone Encounter (Signed)
Pt's son states they have just learned patient will be discharged from hospital today.  They have decided to put pt in [facility] however they will not accept her without completed FL-2.  Dr. Clent Ridges is out of office until Thursday.  However, FL2 is needed by Monday morning.  Pt requesting to drop off FL2 today in hopes of having another provider to complete by Monday.

## 2012-01-15 NOTE — Clinical Social Work Note (Addendum)
CSW talked with patient's husband, and son Katherine Ware and Katherine Ware today regarding discharge plans. Family declined CIR as they want to be able to take patient home on Christmas day. CSW and son Katherine Ware talked about patient being able to get into [facility] on Monday and call were made to [facility] in admissions regarding family's request. [facility] on Monday and they were advised of what would be needed by SNF for admission. [name] also faxed the FL-2 to CSW for family to have completed by patient's PCP. CSW talked extensively with son Katherine Ware throughout the day regarding SNF placement today as family initially considered placing patient in another SNF for the weekend and then moving her to [facility]. Then plan was later changed to home for the weekend and then to [facility] on Monday. Son was advised on requesting patient's medical records for this admission and also getting FL-2 completed and signed by MD. [facility] has patient's FL-2 from this admission and was forwarded patient's PASARR number and d/c summary.  Son and husband expressed appreciation for CSW's assistance with negotiating  discharge options and communication with [employer] today.  [name], MSW, LCSW [phone]

## 2012-01-15 NOTE — Discharge Summary (Signed)
Physician Discharge Summary  Katherine Ware XBJ:[id number] DOB: 01-06-1941 DOA: 01/07/2012  PCP: Katherine Ware  Admit date: 01/07/2012 Discharge date: 01/15/2012  Time spent: 40  minutes  Recommendations for Outpatient Follow-up:  1. CIR vs Home with home health  Discharge Diagnoses:  Principal Problem:  *Acute kidney injury   Active Problems:  Complicated UTI (urinary tract infection)  Urinary retention with  Hydronephrosis, bilateral  Supranuclear palsies, progressive  Lower extremity weakness  Heel ulcer  Hypertension    Discharge Condition: fair  Diet recommendation: low sodium  Filed Weights   01/12/12 2114 01/13/12 2131 01/14/12 2017  Weight: 83.6 kg (184 lb 4.9 oz) 82.5 kg (181 lb 14.1 oz) 81.738 kg (180 lb 3.2 oz)    History of present illness:  Please refer to admission Ware&P in detail but in brief 71 y/o with progressive supranuclear palsy who presented with fevers, dysuria, and chills. Katherine consulted and renal U/S showed bilateral hydronephrosis. Patient also had hematuria. Hematuria has been resolving with antibiotics and Katherine following and making recommendations. Patient to get intermittent catheterizations QID.   Hospital Course:   . Marland Kitchen Complicated UTI -  - Blood cultures negative . Urine cultures grew out proteus mirabilis. Placed on cipro ( day 9 of abx. Will treat for  Total 4 days )  - Renal ultrasound shows chronic BL hydronephrosis and also reports No convincing evidence of pyonephrosis of the dilated collecting system.  - Katherine recommend intermittent catheterization QID.  - infectious etiology likely causing hematuria. Per Katherine's note patient has already had cystoscopy and will defer further work-up to them. Currently clearing up with antibiotic .  Will follow up with Katherine as outpatient.  Leucocytosis  Remains afebrile. CXR negative for infection   Progressive weakness with history of progressive supranuclear palsy  follow up with  patient's neurologist as outpatient.  Continue sinemet   . Acute Kidney injury  - probably secondary to dehydration poor oral intake and patient is also on Bactrim, lisinopril, diuretics and NSAIDs which could all have further worsened her renal function.  - Improved with hydration and cessation of above listed medications.   Anemia -  Stable.   Left heel ulcer  - wound consult .  Prevalon boots to decrease pressure to BLE . Does not appear infected   Hypertension - continue metoprolol. D/.c HCTZ and lisinopril. BP stable  Code Status: DNR   Disposition Plan: CIR vs home with Katherine Ware  Consultants:  Katherine Ware)  Procedures:  none    Antibiotics:  CIPRO ( Will complete 2 week course on 12/25)    Discharge Exam: Filed Vitals:   01/14/12 0944 01/14/12 1728 01/14/12 2017 01/15/12 0604  BP: 134/64 150/66 146/60 126/60  Pulse: 86 96 97 104  Temp: 97.5 F (36.4 C) 98.8 F (37.1 C) 98.8 F (37.1 C) 99 F (37.2 C)  TempSrc: Oral Oral Oral Oral  Resp: 20 18 18 18   Height:      Weight:   81.738 kg (180 lb 3.2 oz)   SpO2: 97% 97% 94% 94%    General: NAD, appears fatigued  HEENT: no pallor, moist oral mucosa  Cardiovascular: RRR, no murmurs  Respiratory: clear b/l , no wheezes, or rhonchi  Abdomen: soft, NT, ND  Ext: warm, no edema, heel ulcer appears clean.  CNS: AAOX3, b/l  lower extremity weakness   Discharge Instructions  Discharge Orders    Future Appointments: Provider: Department: Dept Phone: Ware:   02/03/2012 10:00 AM [facility] 1 Katherine  Ware ULTRASOUND [phone] Katherine Ware   03/25/2012 9:00 AM Katherine Ware Katherine Ware [phone] Katherine Ware       Medication List     As of 01/15/2012  9:44 AM    STOP taking these medications         doxycycline 100 MG capsule   Commonly known as: VIBRAMYCIN      hydrochlorothiazide 25 MG tablet   Commonly known as: HYDRODIURIL      ibuprofen 200 MG tablet   Commonly known as:  ADVIL,MOTRIN      lisinopril 20 MG tablet   Commonly known as: PRINIVIL,ZESTRIL      sulfamethoxazole-trimethoprim 800-160 MG per tablet   Commonly known as: BACTRIM DS,SEPTRA DS      TAKE these medications         acetaminophen 500 MG tablet   Commonly known as: TYLENOL   Take 500 mg by mouth every 6 (six) hours as needed.      aspirin 81 MG tablet   Take 81 mg by mouth daily.      atorvastatin 20 MG tablet   Commonly known as: LIPITOR   Take 1 tablet (20 mg total) by mouth daily.      Calcium Carb-Vit D-Soy Isoflav 600-200-25 MG-UNIT-MG Tabs   daily      carbidopa-levodopa 25-100 MG per tablet   Commonly known as: SINEMET IR   Take 1 tablet by mouth 3 (three) times daily.      ciprofloxacin 500 MG tablet   Commonly known as: CIPRO   Take 1 tablet (500 mg total) by mouth 2 (two) times daily.      clopidogrel 75 MG tablet   Commonly known as: PLAVIX   Take 1 tablet (75 mg total) by mouth daily.      diclofenac sodium 1 % Gel   Commonly known as: VOLTAREN   Apply 2 g topically 2 (two) times daily.      docusate sodium 100 MG capsule   Commonly known as: COLACE   Take 100 mg by mouth daily.      feeding supplement Pudg   Take 1 Container by mouth 3 (three) times daily between meals.      HYDROcodone-acetaminophen 5-325 MG per tablet   Commonly known as: NORCO/VICODIN   Take 1 tablet by mouth every 4 (four) hours as needed for pain.      metoprolol succinate 25 MG 24 hr tablet   Commonly known as: TOPROL-XL   Take 1 tablet (25 mg total) by mouth daily.      multivitamin tablet   Take 1 tablet by mouth daily.      omeprazole 20 MG capsule   Commonly known as: PRILOSEC   Take 20 mg by mouth daily.      oxybutynin 5 MG tablet   Commonly known as: DITROPAN   Take 1 tablet (5 mg total) by mouth 2 (two) times daily.      venlafaxine 37.5 MG tablet   Commonly known as: EFFEXOR   Take 1 tablet (37.5 mg total) by mouth daily.      Vitamin D 2000 UNITS Caps    Take by mouth.           Follow-up Information    Follow up with Katherine Ware. In 1 week.   Contact information:   [address] [place] [address] [phone]       Follow up with Katherine Aus, Ware. In  4 weeks.   Contact information:   [address] [place] [address] [phone]           The results of significant diagnostics from this hospitalization (including imaging, microbiology, ancillary and laboratory) are listed below for reference.    Significant Diagnostic Studies: Dg Chest 2 View  01/13/2012  *RADIOLOGY REPORT*  Clinical Data: Shortness of breath, weakness  CHEST - 2 VIEW  Comparison: 01/07/2012  Findings: Cardiomegaly again noted.  Slight improvement in aeration.  Residual mild interstitial prominence without convincing pulmonary edema.  Elevation of the right hemidiaphragm again noted. No segmental infiltrate.  IMPRESSION: No convincing pulmonary edema.  Cardiomegaly again noted.  No segmental infiltrate.   Original Report Authenticated By: Natasha Mead, M.D.    Dg Chest 2 View  01/07/2012  *RADIOLOGY REPORT*  Clinical Data: Short of breath.  Fever.  CHEST - 2 VIEW  Comparison: 05/02/2011.  Findings: Low volume chest.  Low volumes accentuate the cardiopericardial silhouette which may be mildly enlarged. Pulmonary vascular congestion is suspected.  No focal consolidations.  No effusion.  IMPRESSION: Low volume chest.  Possible mild CHF.  Consider repeat PA and lateral with full inspiration when patient condition permits.   Original Report Authenticated By: Andreas Newport, M.D.    US Renal  01/07/2012  *RADIOLOGY REPORT*  Clinical Data: Urinary tract infection.  Chronic hydronephrosis. Neurogenic bladder.  RENAL/URINARY TRACT ULTRASOUND COMPLETE  Comparison:  CT 12/11/2011.  Findings:  Right Kidney:  13.6 cm.  Chronic severe hydronephrosis.  No pyonephrosis is identified.  Allowing for differences in technique, the  renal pelvis appears similar to the prior examination.  Left Kidney:  13.2 cm.  Chronic severe hydronephrosis.  No pyonephrosis is identified.  Allowing for differences in technique, the renal pelvis appears similar to the prior examination.  Bladder:  Poorly visualized.  IMPRESSION: Chronic bilateral hydronephrosis.  Allowing for differences in technique, no interval change from 12/11/2011.  No convincing evidence of pyonephrosis of the dilated collecting system.   Original Report Authenticated By: Andreas Newport, M.D.    Dg Foot Complete Left  01/07/2012  *RADIOLOGY REPORT*  Clinical Data: Short of breath.  Fever.  Urinary tract infection. Osteomyelitis.  LEFT FOOT - COMPLETE 3+ VIEW  Comparison: None.  Findings: No acute osseous abnormality.  Plantar flexion of the toes.  First MTP joint osteoarthritis is moderate to severe.  No osteolysis.  Achilles insertional of Achilles enthesopathy is present.  Midfoot appears within normal limits.  IMPRESSION: No acute osseous abnormality.   Original Report Authenticated By: Andreas Newport, M.D.    Dg Swallowing Func-speech Pathology  01/11/2012  Katherine Ware, CCC-SLP     01/11/2012  1:20 PM Objective Swallowing Evaluation: Modified Barium Swallowing Study   Patient Details  Name: Katherine Ware MRN: [id number] Date of Birth: April 18, 1940  Today's Date: 01/11/2012 Time: 1115-1200 SLP Time Calculation (min): 45 min  Past Ware History:  Past Ware History  Diagnosis Date  . Hyperlipidemia   . Hypertension   . Asthma   . Hypokalemia   . Osteoporosis   . Osteoarthritis   . 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 Pearlean Brownie  . Parkinsonian syndrome     saw Darrol Angel NP   . Progressive supranuclear palsy     sees Dr. Dan Humphreys at [facility]   . Neurogenic bladder     sees Dr. Retta Diones   . Frequent UTI    Past Surgical History:  Past Surgical History  Procedure Date  . Cholecystectomy   . Breast surgery     benign left breast cyst  . Knee surgery     left knee   . Carpal tunnel release     bilateral  . Epidural steroid     to lumbar spine 8-11 and 9-11  . Esophageal dilation     per Dr. Yancey Flemings   . Colonscopy 2010    per Dr. Marina Goodell, benign polyps, repeat 3 years   . Esophageal dilation     dr  Marina Goodell   . Tubal ligation   . Benign  right breat    HPI:  71 year-old female with history of progressive supranuclear  palsy, hypertension was brought to the ER because patient has  been experiencing subjective feeling of fever and chills over  last 2-3 days. In addition patient has been having dysuria or  difficulty urinating. Patient's dysuria and urinary difficulties  has been going on for last few weeks and had followed with  urologist and had CT abdomen done last month which showed  bilateral hydronephrosis with hydroureter and felt to be having  bladder outlet obstruction versus neurogenic bladder. Patient's  symptoms again started happening 2 days ago and at that time  patient's family care physician has placed patient on Bactrim  despite which patient still has symptoms had completely ER. In  the ER patient was found to be afebrile though family had noticed  fever at her house with temperatures running around 103F.  Patient in addition has been becoming progressively weaker in bed  bound over the last 2 weeks and also had Ware fall last week.  Patient also developed Ware heel ulcer.. According to pts sister who  cares for her, her head is always tilet to the right but she sits  up all day in Ware chair and always eats sitting up. Her family  'mashes up' her food but she has also been able to eat solid  meats. She becomes fatigued at meals.      Assessment / Plan / Recommendation Clinical Impression  Dysphagia Diagnosis: Moderate pharyngeal phase  dysphagia;Moderate oral phase dysphagia Clinical impression: Pt presents with Ware moderate oral and  oropharyngeal deysphagia. Oral phase characterized by reduced  lingual manipulation and struggle to propel bolus postiorally  with  ineffective tounge pumping movement. Fatigue impacted  fucntion with significant pocketing at end of session. Primary  sensory deficit lead to moderate pharyngeal dysphagia with Ware  delay in swallow initiation to the pyriform sinuses with normal  size thin liquids boluses (cup or straw) with silent aspiration  before the swallow. With cues for very small straw sips  aspiration was not observed. Nectar thick liquids only resulted  in flash penetration before the swallow. Attmpeted chin tuck with  no improvement. Though very small straw sips prevented aspriation  during this study it is unlikely that pt would consistently take  small enough sips to prevent aspiration.  Pt is also at  significant risk of aspiration of reflux given appearance during  this study and pts report of frequent regurgitation during this  hospital stay.    At this time, given acute illness, fatigue and poor positioning  in the hospital (without chair) pts risk of impact from  aspiration is high. Would recommend pt consume nectar thick  liquids with Ware dysphagia 2 (fine chop) diet during the short  term. Recommend frequent short meals to reduce impact of fatigue  on function. Encouraged pt to try it for  now to help her  determine if she wants to continue on Ware modified diet or accept  risk of aspiration. Currently her CXR does not indicate pna so  there is Ware possiblity she has tolerated aspiration up to this  point.     Treatment Recommendation  Therapy as outlined in treatment plan below    Diet Recommendation Dysphagia 2 (Fine chop);Nectar-thick liquid   Liquid Administration via: Straw Medication Administration: Crushed with puree Supervision: Full supervision/cueing for compensatory strategies Compensations: Slow rate;Small sips/bites;Follow solids with  liquid;Multiple dry swallows after each bite/sip Postural Changes and/or Swallow Maneuvers: Seated upright 90  degrees;Upright 30-60 min after meal    Other  Recommendations Oral Care  Recommendations: Oral care QID   Follow Up Recommendations  Skilled Nursing facility;24 hour supervision/assistance    Frequency and Duration min 2x/week  2 weeks   Pertinent Vitals/Pain NA    SLP Swallow Goals Patient will utilize recommended strategies during swallow to  increase swallowing safety with: Maximum assistance   General HPI: 71 year-old female with history of progressive  supranuclear palsy, hypertension was brought to the ER because  patient has been experiencing subjective feeling of fever and  chills over last 2-3 days. In addition patient has been having  dysuria or difficulty urinating. Patient's dysuria and urinary  difficulties has been going on for last few weeks and had  followed with urologist and had CT abdomen done last month which  showed bilateral hydronephrosis with hydroureter and felt to be  having bladder outlet obstruction versus neurogenic bladder.  Patient's symptoms again started happening 2 days ago and at that  time patient's family care physician has placed patient on  Bactrim despite which patient still has symptoms had completely  ER. In the ER patient was found to be afebrile though family had  noticed fever at her house with temperatures running around  103F. Patient in addition has been becoming progressively weaker  in bed bound over the last 2 weeks and also had Ware fall last week.  Patient also developed Ware heel ulcer.. According to pts sister who  cares for her, her head is always tilet to the right but she sits  up all day in Ware chair and always eats sitting up. Her family  'mashes up' her food but she has also been able to eat solid  meats. She becomes fatigued at meals.  Type of Study: Modified Barium Swallowing Study Reason for Referral: Objectively evaluate swallowing function Previous Swallow Assessment: Patient and family report prior  swallow evaluation at [facility] in October of 2013 Diet Prior to this Study: Dysphagia 2 (chopped);Thin liquids Temperature Spikes  Noted: No Respiratory Status: Room air History of Recent Intubation: No Behavior/Cognition: Alert;Cooperative Oral Cavity - Dentition: Adequate natural dentition Oral Motor / Sensory Function: Impaired - see Bedside swallow  eval Self-Feeding Abilities: Needs assist Patient Positioning: Postural control interferes with function Baseline Vocal Quality: Clear;Low vocal intensity Volitional Cough: Weak Volitional Swallow: Able to elicit Anatomy: Within functional limits Pharyngeal Secretions: Not observed secondary MBS    Reason for Referral Objectively evaluate swallowing function   Oral Phase Oral Preparation/Oral Phase Oral Phase: Impaired Oral - Nectar Oral - Nectar Straw: Weak lingual manipulation;Reduced posterior  propulsion;Delayed oral transit Oral - Thin Oral - Thin Teaspoon: Weak lingual manipulation;Reduced posterior  propulsion;Delayed oral transit Oral - Thin Cup: Weak lingual manipulation;Reduced posterior  propulsion;Delayed oral transit Oral - Thin Straw: Weak lingual manipulation;Reduced posterior  propulsion;Delayed oral transit Oral - Solids Oral -  Puree: Lingual pumping;Weak lingual manipulation;Reduced  posterior propulsion;Piecemeal swallowing;Delayed oral transit Oral - Mechanical Soft: Delayed oral transit;Lingual/palatal  residue;Right pocketing in lateral sulci;Pocketing in anterior  sulcus;Holding of bolus;Reduced posterior propulsion;Weak lingual  manipulation Oral - Pill: Reduced posterior propulsion;Lingual/palatal  residue;Piecemeal swallowing;Delayed oral transit;Lingual  pumping;Weak lingual manipulation   Pharyngeal Phase Pharyngeal Phase Pharyngeal Phase: Impaired Pharyngeal - Nectar Pharyngeal - Nectar Straw: Delayed swallow initiation;Premature  spillage to valleculae;Premature spillage to pyriform  sinuses;Penetration/Aspiration before swallow Penetration/Aspiration details (nectar straw): Material enters  airway, remains ABOVE vocal cords then ejected out;Material does  not enter  airway Pharyngeal - Thin Pharyngeal - Thin Teaspoon: Not tested Pharyngeal - Thin Cup: Delayed swallow initiation;Premature  spillage to valleculae;Premature spillage to pyriform  sinuses;Penetration/Aspiration before swallow;Moderate aspiration Penetration/Aspiration details (thin cup): Material enters  airway, passes BELOW cords without attempt by patient to eject  out (silent aspiration);Material does not enter airway Pharyngeal - Thin Straw: Delayed swallow initiation;Premature  spillage to valleculae;Premature spillage to pyriform  sinuses;Moderate aspiration;Penetration/Aspiration before swallow Penetration/Aspiration details (thin straw): Material does not  enter airway;Material enters airway, remains ABOVE vocal cords  then ejected out;Material enters airway, passes BELOW cords  without attempt by patient to eject out (silent aspiration) Pharyngeal - Solids Pharyngeal - Puree: Delayed swallow initiation Pharyngeal - Regular: Delayed swallow initiation  Cervical Esophageal Phase    GO    Cervical Esophageal Phase Cervical Esophageal Phase: Impaired Cervical Esophageal Phase - Comment Cervical Esophageal Comment: Appearance of significant residual  in distal esophagus with constant superior and inferior surging  through GE junction. No radiologist present to confirm        [facility], [place] CCC-SLP [phone]  [name] 01/11/2012, 1:17 PM      Microbiology: Recent Results (from the past 240 hour(s))  URINE CULTURE     Status: Normal   Collection Time   01/06/12 12:24 PM      Component Value Range Status Comment   Colony Count >=100,000 COLONIES/ML   Final    Organism ID, Bacteria Multiple bacterial morphotypes present, none   Final    Organism ID, Bacteria predominant. Suggest appropriate recollection if    Final    Organism ID, Bacteria clinically indicated.   Final   URINE CULTURE     Status: Normal   Collection Time   01/07/12  9:18 PM      Component Value Range Status Comment    Specimen Description URINE, CATHETERIZED   Final    Special Requests NONE   Final    Culture  Setup Time 01/08/2012 00:35   Final    Colony Count >=100,000 COLONIES/ML   Final    Culture PROTEUS MIRABILIS   Final    Report Status 01/09/2012 FINAL   Final    Organism ID, Bacteria PROTEUS MIRABILIS   Final   CULTURE, BLOOD (ROUTINE X 2)     Status: Normal   Collection Time   01/07/12 11:41 PM      Component Value Range Status Comment   Specimen Description BLOOD HAND RIGHT   Final    Special Requests BOTTLES DRAWN AEROBIC AND ANAEROBIC 10CC   Final    Culture  Setup Time 01/08/2012 04:54   Final    Culture NO GROWTH 5 DAYS   Final    Report Status 01/14/2012 FINAL   Final   CULTURE, BLOOD (ROUTINE X 2)     Status: Normal   Collection Time   01/07/12 11:50 PM      Component Value  Range Status Comment   Specimen Description BLOOD HAND LEFT   Final    Special Requests BOTTLES DRAWN AEROBIC AND ANAEROBIC B [facility] Ware Ware   Final    Culture  Setup Time 01/08/2012 04:54   Final    Culture NO GROWTH 5 DAYS   Final    Report Status 01/14/2012 FINAL   Final      Labs: Basic Metabolic Panel:  Lab 01/14/12 1610 01/12/12 0545 01/11/12 0730 01/09/12 0700  NA 142 146* 143 140  K 3.9 3.9 4.0 3.7  CL 105 108 108 106  CO2 27 26 26 23   GLUCOSE 114* 103* 109* 151*  BUN 11 13 16  29*  CREATININE 0.70 0.80 0.86 1.15*  CALCIUM 8.7 8.7 8.4 8.5  MG -- -- -- --  PHOS -- -- -- --   Liver Function Tests: No results found for this basename: AST:5,ALT:5,ALKPHOS:5,BILITOT:5,PROT:5,ALBUMIN:5 in the last 168 hours No results found for this basename: LIPASE:5,AMYLASE:5 in the last 168 hours No results found for this basename: AMMONIA:5 in the last 168 hours CBC:  Lab 01/14/12 0610 01/13/12 0603 01/12/12 0545 01/11/12 0730 01/09/12 0700  WBC 13.9* 14.5* 13.3* 12.3* 12.1*  NEUTROABS -- -- -- -- --  HGB 10.2* 9.8* 10.1* 9.8* 9.4*  HCT 31.7* 30.3* 31.2* 30.6* 28.8*  MCV 92.7 92.1 93.1 93.3 90.3  PLT 372  401* 403* 377 366   Cardiac Enzymes: No results found for this basename: CKTOTAL:5,CKMB:5,CKMBINDEX:5,TROPONINI:5 in the last 168 hours BNP: BNP (last 3 results) No results found for this basename: PROBNP:3 in the last 8760 hours CBG:  Lab 01/08/12 1006  GLUCAP 148*       Signed:  Kavaughn Faucett  [facility] 01/15/2012, 9:44 AM

## 2012-01-15 NOTE — Progress Notes (Signed)
Physical Therapy Treatment Patient Details Name: Katherine Ware MRN: [id number] DOB: 03/18/1940 Today's Date: 01/15/2012 Time: 1040-1107 PT Time Calculation (min): 27 min  PT Assessment / Plan / Recommendation Comments on Treatment Session  Patient is planning on DC today. Spoke in depth with caregiver and spouse. Caregiver present throughout and also stating that they have a hoyer lift at home that she can use and that the caregiver can educate family on. Husband and caregiver feel they can provide adequate support. Recommend ambulance transfer home.     Follow Up Recommendations  Home health PT;Supervision/Assistance - 24 hour     Does the patient have the potential to tolerate intense rehabilitation     Barriers to Discharge        Equipment Recommendations  None recommended by PT    Recommendations for Other Services    Frequency Min 3X/week   Plan      Precautions / Restrictions Precautions Precautions: Fall   Pertinent Vitals/Pain     Mobility  Bed Mobility Rolling Right: 1: +1 Total assist Right Sidelying to Sit: 1: +1 Total assist Sitting - Scoot to Edge of Bed: 1: +1 Total assist Details for Bed Mobility Assistance: Total A with use of pad to get patient to EOB. A with shoulder positioning to sit upright Transfers Transfers: Squat Pivot Transfers Squat Pivot Transfers: 1: +2 Total assist Squat Pivot Transfers: Patient Percentage: 10% Details for Transfer Assistance: Patient able to show slight initation but unable to take steps or stand fully upright x2 attempts. A with all aspects of squad pivot    Exercises     PT Diagnosis:    PT Problem List:   PT Treatment Interventions:     PT Goals Acute Rehab PT Goals PT Goal: Supine/Side to Sit - Progress: Progressing toward goal PT Goal: Sit at Edge Of Bed - Progress: Progressing toward goal PT Transfer Goal: Bed to Chair/Chair to Bed - Progress: Progressing toward goal  Visit Information  Last PT Received On:  01/15/12 Assistance Needed: +2    Subjective Data      Cognition  Overall Cognitive Status: History of cognitive impairments - at baseline Arousal/Alertness: Awake/alert Orientation Level: Appears intact for tasks assessed Behavior During Session: [facility] for tasks performed    Balance  Static Sitting Balance Static Sitting - Balance Support: Feet supported;Bilateral upper extremity supported Static Sitting - Level of Assistance: 4: Min assist Static Sitting - Comment/# of Minutes: x8 mins  End of Session PT - End of Session Equipment Utilized During Treatment: Gait belt Activity Tolerance: Patient limited by fatigue Patient left: in chair;with call bell/phone within reach Nurse Communication: Mobility status   GP     Fredrich Birks 01/15/2012, 11:57 AM 01/15/2012 Fredrich Birks PTA [phone] pager [phone] office

## 2012-01-15 NOTE — Progress Notes (Signed)
Agree with updated plan

## 2012-01-15 NOTE — Clinical Social Work Placement (Signed)
Clinical Social Work Department CLINICAL SOCIAL WORK PLACEMENT NOTE 01/15/2012  Patient:  YSABELA, KEISLER  Account Number:  [id number] Admit date:  01/07/2012  Clinical Social Worker:  [name], LCSW  Date/time:  01/15/2012 05:49 AM  Clinical Social Work is seeking post-discharge placement for this patient at the following level of care:   SKILLED NURSING   (*CSW will update this form in Epic as items are completed)   01/13/2012  Patient/family provided with [facility] Department of Clinical Social Work's list of facilities offering this level of care within the geographic area requested by the patient (or if unable, by the patient's family).  01/13/2012  Patient/family informed of their freedom to choose among providers that offer the needed level of care, that participate in Medicare, Medicaid or managed care program needed by the patient, have an available bed and are willing to accept the patient.  01/13/2012  Patient/family informed of [facility]' ownership interest in [facility], as well as of the fact that they are under no obligation to receive care at this facility.  PASARR submitted to EDS on 01/14/2012 PASARR number received from EDS on 01/14/2012  FL2 transmitted to all facilities in geographic area requested by pt/family on  01/14/2012 FL2 transmitted to all facilities within larger geographic area on   Patient informed that his/her managed care company has contracts with or will negotiate with  certain facilities, including the following:     Patient/family informed of bed offers received:  01/14/2012 Patient chooses bed at  Physician recommends and patient chooses bed at    Patient to be transferred to  on  01/15/2012 Patient to be transferred to facility by   The following physician request were entered in Epic:   Additional Comments: 01/15/12 - Family decided on [facility] for ST rehab, however SNF will not have a bed until Monday, 12/23.  CSW also called [facility] and they will not have a bed until Monday. SEE PROGRESS NOTE DATED 01/15/12

## 2012-01-15 NOTE — Progress Notes (Signed)
Family want to take pt home w/ 24/7 family + hired assist w/ HH f/up.  They also want foley catheter for bladder management.  Relayed this info to pt's CM, [employer].  CIR to sign off.  [phone]

## 2012-01-15 NOTE — Telephone Encounter (Signed)
Usually if being discharge from hospital to facility - hospital can complete FL2. I am not sure if I can complete if have never seen the patient before.

## 2012-01-15 NOTE — Progress Notes (Signed)
NUTRITION FOLLOW UP  Intervention:   1. Continue Ensure Pudding and aspiration precautions per SLP; continue Magic Cup with meals. 2. RD to continue to follow nutrition care plan  Nutrition Dx:   Inadequate oral intake related to acute illness as evidenced by family and caregiver report. Improving.  Goal:   Pt to meet >/= 90% of their estimated nutrition needs. Unmet.  Monitor:   weight trends, lab trends, I/O's, PO intake, supplement tolerance  Assessment:   Family has turned down CIR. Per SW, family delaying dispo plans.  MBSS on 12/16 with recommendations for Dysphagia 2 diet and nectar-thickened liquids. Noted pt is at high risk for aspiration given acute illness, fatigue and poor positioning in hospital. Per chart, pt is eating much better yesterday and today. Consuming 25 - 50% - intake extremely variable.  Height: Ht Readings from Last 1 Encounters:  01/11/12 4' 9.48" (1.46 m)   Weight Status:  Consistent with admit weight of 179 lb Wt Readings from Last 1 Encounters:  01/14/12 180 lb 3.2 oz (81.738 kg)   Re-estimated needs:  Kcal: 1500 - 1600 kcal Protein: 80 - 95 grams protein Fluid: 1.6 - 1.8 liters daily  Skin: DTI to heels  Diet Order: Dysphagia 2; Nectar Thickened Liquids Supplements:  Ensure Pudding PO TID, Magic Cup with meals   Intake/Output Summary (Last 24 hours) at 01/15/12 1009 Last data filed at 01/15/12 0700  Gross per 24 hour  Intake    360 ml  Output   1150 ml  Net   -790 ml   Last BM: 12/18  Labs:  Lab 01/14/12 0610 01/12/12 0545 01/11/12 0730  NA 142 146* 143  K 3.9 3.9 4.0  CL 105 108 108  CO2 27 26 26   BUN 11 13 16   CREATININE 0.70 0.80 0.86  CALCIUM 8.7 8.7 8.4  MG -- -- --  PHOS -- -- --  GLUCOSE 114* 103* 109*   CBG (last 3)  No results found for this basename: GLUCAP:3 in the last 72 hours  Scheduled Meds:    . atorvastatin  20 mg Oral Daily  . carbidopa-levodopa  1 tablet Oral TID WC  . ciprofloxacin  500 mg Oral  BID  . docusate sodium  100 mg Oral Daily  . feeding supplement  1 Container Oral TID BM  . metoprolol succinate  12.5 mg Oral Daily  . multivitamin with minerals  1 tablet Oral Daily  . oxybutynin  5 mg Oral BID  . pantoprazole  40 mg Oral Daily  . sodium chloride  3 mL Intravenous Q12H  . venlafaxine  37.5 mg Oral Q breakfast   Continuous Infusions:   Jarold Motto MS, RD, LDN Pager: [phone] After-hours pager: [phone]

## 2012-01-21 NOTE — Telephone Encounter (Signed)
I came into the office on 01-18-12 and filled this out for the son to pick up.

## 2012-02-03 ENCOUNTER — Ambulatory Visit (HOSPITAL_COMMUNITY)
Admission: RE | Admit: 2012-02-03 | Discharge: 2012-02-03 | Disposition: A | Discharge status: Home / Self Care | Payer: Medicare Other | Source: Ambulatory Visit | Attending: Urology | Admitting: Urology

## 2012-02-03 DIAGNOSIS — N1330 Unspecified hydronephrosis: Secondary | ICD-10-CM | POA: Insufficient documentation

## 2012-02-03 DIAGNOSIS — N319 Neuromuscular dysfunction of bladder, unspecified: Secondary | ICD-10-CM | POA: Insufficient documentation

## 2012-02-04 ENCOUNTER — Other Ambulatory Visit: Payer: Self-pay | Admitting: Family Medicine

## 2012-02-09 ENCOUNTER — Other Ambulatory Visit: Payer: Self-pay | Admitting: Family Medicine

## 2012-02-23 ENCOUNTER — Ambulatory Visit (INDEPENDENT_AMBULATORY_CARE_PROVIDER_SITE_OTHER): Payer: Medicare Other | Admitting: Urology

## 2012-02-23 DIAGNOSIS — N1330 Unspecified hydronephrosis: Secondary | ICD-10-CM

## 2012-03-07 ENCOUNTER — Other Ambulatory Visit (HOSPITAL_COMMUNITY): Payer: Self-pay | Admitting: Family Medicine

## 2012-03-07 DIAGNOSIS — R1310 Dysphagia, unspecified: Secondary | ICD-10-CM

## 2012-03-14 ENCOUNTER — Ambulatory Visit (HOSPITAL_COMMUNITY)
Admission: RE | Admit: 2012-03-14 | Discharge: 2012-03-14 | Disposition: A | Discharge status: Home / Self Care | Payer: No Typology Code available for payment source | Source: Ambulatory Visit | Attending: Family Medicine | Admitting: Family Medicine

## 2012-03-14 DIAGNOSIS — M810 Age-related osteoporosis without current pathological fracture: Secondary | ICD-10-CM | POA: Insufficient documentation

## 2012-03-14 DIAGNOSIS — Z8673 Personal history of transient ischemic attack (TIA), and cerebral infarction without residual deficits: Secondary | ICD-10-CM | POA: Insufficient documentation

## 2012-03-14 DIAGNOSIS — E785 Hyperlipidemia, unspecified: Secondary | ICD-10-CM | POA: Insufficient documentation

## 2012-03-14 DIAGNOSIS — J45909 Unspecified asthma, uncomplicated: Secondary | ICD-10-CM | POA: Insufficient documentation

## 2012-03-14 DIAGNOSIS — G20 Parkinson's disease: Secondary | ICD-10-CM | POA: Insufficient documentation

## 2012-03-14 DIAGNOSIS — I10 Essential (primary) hypertension: Secondary | ICD-10-CM | POA: Insufficient documentation

## 2012-03-14 DIAGNOSIS — R1310 Dysphagia, unspecified: Secondary | ICD-10-CM

## 2012-03-14 DIAGNOSIS — R1313 Dysphagia, pharyngeal phase: Secondary | ICD-10-CM | POA: Insufficient documentation

## 2012-03-14 DIAGNOSIS — R1311 Dysphagia, oral phase: Secondary | ICD-10-CM | POA: Insufficient documentation

## 2012-03-14 DIAGNOSIS — G20A1 Parkinson's disease without dyskinesia, without mention of fluctuations: Secondary | ICD-10-CM | POA: Insufficient documentation

## 2012-03-14 NOTE — Procedures (Signed)
Objective Swallowing Evaluation: Modified Barium Swallowing Study  Patient Details  Name: Katherine Ware MRN: [id number] Date of Birth: 1940/09/01  Today's Date: 03/14/2012 Time: 4098-1191 SLP Time Calculation (min): 47 min  Past Medical History:  Past Medical History  Diagnosis Date  . Hyperlipidemia   . Hypertension   . Asthma   . Hypokalemia   . Osteoporosis   . Osteoarthritis   . 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 Pearlean Brownie  . Parkinsonian syndrome     saw Darrol Angel NP   . Progressive supranuclear palsy     sees Dr. Dan Humphreys at [facility]   . Neurogenic bladder     sees Dr. Retta Diones   . Frequent UTI    Past Surgical History:  Past Surgical History  Procedure Laterality Date  . Cholecystectomy    . Breast surgery      benign left breast cyst  . Knee surgery      left knee  . Carpal tunnel release      bilateral  . Epidural steroid      to lumbar spine 8-11 and 9-11  . Esophageal dilation      per Dr. Yancey Flemings   . Colonscopy  2010    per Dr. Marina Goodell, benign polyps, repeat 3 years   . Esophageal dilation      dr  Marina Goodell   . Tubal ligation    . Benign  right breat     HPI:  72 yo female referred for MBS to evaluate for readiness for dietary advancement.  PMH + for PSP, Parkinsonian syndrome, HLD, asthma, hypokalemia, lacunar cva, neurogenic bladder, UTI, OEA, osteoporosis.  Pt admits to reflux symptoms with sour taste after she eats.  PSH + for breast surgery, knee surgery, carpal tunnel release, colonscopy, epidural sterpid, esophageal dilatation, tubal ligation.  Pt reports having undergone approx 7 EGDs with first in 1995 and last being completed approx six years ago.  Admission to hospital in Dec 2013 for "kidney infection" noted and pt had undergone a swallow study at that time. Pt's diet when admitted to SNF from hospital was dys2/nectar.  Pt has worked with slp since then.  Per SNF SLP, pt had tolerated po diet but does fatigue easily and will have  intermittent difficulties with mixed consistencies. Per SNF SLP, pt "sneaks" some thin liquids - pt reports drinking mostly nectar liquids.       Assessment / Plan / Recommendation Clinical Impression  Dysphagia Diagnosis: Moderate oral phase dysphagia;Mild pharyngeal phase dysphagia;Suspected primary esophageal dysphagia  Clinical impression: Pt presents with moderate oral and minimal pharyngeal dysphagia demonstrating improvement since her MBS in Dec 2013.  Aspiration was not observed and trace upper laryngeal penetration observed x2 of thin cleared with further swallows.  Oral manipulation difficulties due to weakness and discoordination results in delayed transit and decreased cohesiveness.  Pharyngeal swallow was mildly delayed to vallecular space with solids but muscular contraction was strong without significant residuals.    Pt is in natural chin tuck position when eating and sitterSNF SLP reports she always eats in her chair.  Kyphosis noted which may contribute to delayed esophageal clearance and has known h/o GERD/esophageal strictures, therefore recommend continued esophageal precautions. Fortunately pt uses caution and takes small bites/sips, which will enhance her airway protection.      Rec consider advancing diet to soft/ground meats/thin liquids with strict precautions and monitoring of tolerance.  Pt may need future diet modification due to progressive medical diagnosis  and dysphagia.    Pt was educated to compensatory strategies,  test results and recommendations throughout entire test.  Thanks for this referral.     Treatment Recommendation    SNF follow up   Diet Recommendation Thin liquid;Dysphagia 3 (Mechanical Soft) (ground meat, extra gravy and sauce)   Liquid Administration via: Straw;Cup Medication Administration: Whole meds with puree Supervision: Full supervision/cueing for compensatory strategies Compensations: Slow rate;Small sips/bites;Follow solids with  liquid Postural Changes and/or Swallow Maneuvers: Seated upright 90 degrees;Out of bed for meals;Upright 30-60 min after meal    Other  Recommendations   n/a  Follow Up Recommendations  Skilled Nursing facility           SLP Swallow Goals  n/a eval and educate - outpt   General Date of Onset: 03/14/12 HPI: 72 yo female referred for MBS to evaluate for readiness for dietary advancement.  PMH + for PSP, Parkinsonian syndrome, HLD, asthma, hypokalemia, lacunar cva, neurogenic bladder, UTI, OEA, osteoporosis.  Pt admits to reflux symptoms with sour taste after she eats.  PSH + for breast surgery, knee surgery, carpal tunnel release, colonscopy, epidural sterpid, esophageal dilatation, tubal ligation.  Pt reports having undergone approx 7 EGDs with first in 1995 and last being completed approx six years ago.  Admission to hospital in Dec 2013 for "kidney infection" noted and pt had undergone a swallow study at that time. Pt's diet when admitted to SNF from hospital was dys2/nectar.  Pt has worked with slp since then.  Per SNF SLP, pt had tolerated po diet but does fatigue easily and will have intermittent difficulties with mixed consistencies. Per SNF SLP, pt "sneaks" some thin liquids - pt reports drinking mostly nectar liquids.   Type of Study: Modified Barium Swallowing Study Previous Swallow Assessment: Silent aspiration of thin on MBS Dec 2013 Diet Prior to this Study: Dysphagia 3 (soft);Dysphagia 2 (chopped);Nectar-thick liquids Respiratory Status: Room air History of Recent Intubation: No Behavior/Cognition: Alert;Cooperative Oral Cavity - Dentition: Adequate natural dentition Oral Motor / Sensory Function: Impaired sensory;Impaired motor (head in chin tuck position, ? d/t accessory nerve involvemen) Self-Feeding Abilities: Needs assist Patient Positioning: Upright in chair Baseline Vocal Quality: Low vocal intensity Volitional Cough: Weak Volitional Swallow: Able to elicit Anatomy:   (pt is kyphotic) Pharyngeal Secretions: Not observed secondary MBS    Reason for Referral     Oral Phase Oral Preparation/Oral Phase Oral Phase: Impaired Oral - Nectar Oral - Nectar Cup: Weak lingual manipulation;Reduced posterior propulsion Oral - Thin Oral - Thin Teaspoon: Weak lingual manipulation;Reduced posterior propulsion Oral - Thin Cup: Weak lingual manipulation;Reduced posterior propulsion Oral - Thin Straw: Weak lingual manipulation;Reduced posterior propulsion Oral - Solids Oral - Puree: Weak lingual manipulation;Reduced posterior propulsion;Lingual pumping Oral - Regular: Weak lingual manipulation;Lingual pumping;Impaired mastication;Piecemeal swallowing (vertical mastication pattern with cracker) Oral - Pill: Weak lingual manipulation;Reduced posterior propulsion;Lingual pumping Oral Phase - Comment Oral Phase - Comment: delays in oral transit were variable:  decreased oral control   Pharyngeal Phase Pharyngeal Phase Pharyngeal Phase: Impaired Pharyngeal - Thin Pharyngeal - Thin Teaspoon: Penetration/Aspiration during swallow Penetration/Aspiration details (thin teaspoon): Material enters airway, remains ABOVE vocal cords then ejected out Pharyngeal - Thin Cup: Penetration/Aspiration during swallow Pharyngeal - Solids Pharyngeal - Puree: Delayed swallow initiation;Premature spillage to valleculae Pharyngeal - Regular: Premature spillage to valleculae;Delayed swallow initiation Pharyngeal - Pill: Premature spillage to valleculae (given with pudding) Pharyngeal Phase - Comment Pharyngeal Comment: pharyngeal swallow was strong without resdiuals, mild delay in initiation with pudding/cracker  Cervical  Esophageal Phase    GO    Cervical Esophageal Phase Cervical Esophageal Phase: Impaired Cervical Esophageal Phase - Comment Cervical Esophageal Comment: Appearance of prominent cricopharyngeus without significant retention or backflow into pharynx.  Pt also ppeared  with intermittent distal stasis (WITHOUT pt sensation/awareness) and tertiary contractions with mild retrograde propuslion of liquids- ? motility issues.  Thin liquid swallows appeared to aid clearance of boluses with increased viscocity.  Radiologist was not present during testing.  Please note, pt does admit to reflux symptoms.      Functional Assessment Tool Used:  (mbs, clinical judgement) Functional Limitations: Swallowing Swallow Current Status (Z6109): At least 20 percent but less than 40 percent impaired, limited or restricted Swallow Goal Status [id number]): At least 20 percent but less than 40 percent impaired, limited or restricted Swallow Discharge Status [phone]): At least 20 percent but less than 40 percent impaired, limited or restricted    Donavan Burnet, MS [facility] SLP [phone]

## 2012-03-25 ENCOUNTER — Encounter: Payer: Self-pay | Admitting: Family Medicine

## 2012-03-25 ENCOUNTER — Ambulatory Visit (INDEPENDENT_AMBULATORY_CARE_PROVIDER_SITE_OTHER): Payer: Medicare Other | Admitting: Family Medicine

## 2012-03-25 VITALS — BP 150/94 | HR 101 | Temp 98.8°F

## 2012-03-25 DIAGNOSIS — G231 Progressive supranuclear ophthalmoplegia [Steele-Richardson-Olszewski]: Secondary | ICD-10-CM

## 2012-03-25 DIAGNOSIS — N390 Urinary tract infection, site not specified: Secondary | ICD-10-CM

## 2012-03-25 DIAGNOSIS — IMO0002 Reserved for concepts with insufficient information to code with codable children: Secondary | ICD-10-CM

## 2012-03-25 DIAGNOSIS — I10 Essential (primary) hypertension: Secondary | ICD-10-CM

## 2012-03-25 DIAGNOSIS — R1310 Dysphagia, unspecified: Secondary | ICD-10-CM

## 2012-03-25 DIAGNOSIS — G20 Parkinson's disease: Secondary | ICD-10-CM

## 2012-03-25 DIAGNOSIS — I6789 Other cerebrovascular disease: Secondary | ICD-10-CM

## 2012-03-25 DIAGNOSIS — G238 Other specified degenerative diseases of basal ganglia: Secondary | ICD-10-CM

## 2012-03-25 DIAGNOSIS — R739 Hyperglycemia, unspecified: Secondary | ICD-10-CM

## 2012-03-25 DIAGNOSIS — R7309 Other abnormal glucose: Secondary | ICD-10-CM

## 2012-03-25 DIAGNOSIS — R339 Retention of urine, unspecified: Secondary | ICD-10-CM

## 2012-03-25 LAB — CBC WITH DIFFERENTIAL/PLATELET
Basophils Absolute: 0 10*3/uL (ref 0.0–0.1)
Basophils Relative: 0 % (ref 0.0–3.0)
Eosinophils Absolute: 0.4 10*3/uL (ref 0.0–0.7)
HCT: 33.4 % — ABNORMAL LOW (ref 36.0–46.0)
Hemoglobin: 10.8 g/dL — ABNORMAL LOW (ref 12.0–15.0)
Lymphs Abs: 1.6 10*3/uL (ref 0.7–4.0)
MCHC: 32.4 g/dL (ref 30.0–36.0)
Monocytes Relative: 6.2 % (ref 3.0–12.0)
Neutro Abs: 10.9 10*3/uL — ABNORMAL HIGH (ref 1.4–7.7)
RDW: 13.4 % (ref 11.5–14.6)

## 2012-03-25 LAB — HEPATIC FUNCTION PANEL
AST: 15 U/L (ref 0–37)
Alkaline Phosphatase: 107 U/L (ref 39–117)
Total Bilirubin: 0.5 mg/dL (ref 0.3–1.2)

## 2012-03-25 LAB — BASIC METABOLIC PANEL
GFR: 63.35 mL/min (ref >60.00)
Potassium: 4.5 mEq/L (ref 3.5–5.1)
Sodium: 143 mEq/L (ref 135–145)

## 2012-03-25 LAB — LIPID PANEL
LDL Cholesterol: 53 mg/dL (ref 0–99)
Total CHOL/HDL Ratio: 3
VLDL: 26 mg/dL (ref 0.0–40.0)

## 2012-03-25 MED ORDER — CIPROFLOXACIN HCL 500 MG PO TABS: 500.0000 mg | ORAL_TABLET | Freq: Two times a day (BID) | ORAL | Status: DC

## 2012-03-25 NOTE — Progress Notes (Signed)
Quick Note:  I spoke with pt's husband ______

## 2012-03-25 NOTE — Progress Notes (Signed)
  Subjective:    Patient ID: Katherine Ware, female    DOB: [birth year], 72 y.o.   MRN: [id number]  HPI 72 yr old female for a cpx. She is doing well in general except she has had some lower abdominal cramps this week. She urinates on her own several times a day but she has constant leakage. When she came home from the rehab facility her in and out catheterizations were cut back to 3 times a week. She is due to see Dr. Retta Diones in April. She will see Dr. Dan Humphreys on May 14.    Review of Systems  HENT: Negative.   Eyes: Negative.   Respiratory: Negative.   Cardiovascular: Negative.   Gastrointestinal: Negative.   Genitourinary: Positive for urgency, frequency and pelvic pain. Negative for dysuria, hematuria, flank pain, decreased urine volume, enuresis, difficulty urinating and dyspareunia.  Musculoskeletal: Negative.   Skin: Negative.   Neurological: Positive for weakness. Negative for dizziness, tremors, seizures, syncope, facial asymmetry, speech difficulty, light-headedness, numbness and headaches.  Psychiatric/Behavioral: Negative.        Objective:   Physical Exam  Constitutional: She is oriented to person, place, and time. She appears well-developed and well-nourished. No distress.  In her wheelchair   HENT:  Head: Normocephalic and atraumatic.  Right Ear: External ear normal.  Left Ear: External ear normal.  Nose: Nose normal.  Mouth/Throat: Oropharynx is clear and moist. No oropharyngeal exudate.  Eyes: Conjunctivae and EOM are normal. Pupils are equal, round, and reactive to light. No scleral icterus.  Neck: Normal range of motion. Neck supple. No JVD present. No thyromegaly present.  Cardiovascular: Normal rate, regular rhythm, normal heart sounds and intact distal pulses.  Exam reveals no gallop and no friction rub.   No murmur heard. Pulmonary/Chest: Effort normal and breath sounds normal. No respiratory distress. She has no wheezes. She has no rales. She exhibits no  tenderness.  Abdominal: Soft. Bowel sounds are normal. She exhibits no distension and no mass. There is no rebound and no guarding.  Mildly tender in the lower abdomen  Musculoskeletal: Normal range of motion. She exhibits no edema and no tenderness.  Lymphadenopathy:    She has no cervical adenopathy.  Neurological: She is alert and oriented to person, place, and time. She has normal reflexes. No cranial nerve deficit. She exhibits normal muscle tone. Coordination normal.  Skin: Skin is warm and dry. No rash noted. No erythema.  Psychiatric: She has a normal mood and affect. Her behavior is normal. Judgment and thought content normal.          Assessment & Plan:  Well exam. Get fasting labs. She had a UA sample obtained yesterday by the home health aide, and it shows another acute UTI. Culture the sample and treat with Cipro.

## 2012-04-13 ENCOUNTER — Encounter: Payer: Self-pay | Admitting: Family Medicine

## 2012-05-10 ENCOUNTER — Other Ambulatory Visit: Payer: Self-pay | Admitting: Family Medicine

## 2012-05-18 ENCOUNTER — Telehealth: Payer: Self-pay | Admitting: Family Medicine

## 2012-05-18 ENCOUNTER — Ambulatory Visit: Payer: Medicare Other | Admitting: Family Medicine

## 2012-05-18 NOTE — Telephone Encounter (Signed)
Pt needs refill generic lipitor 20 mg #90 with 3 refills [facility]

## 2012-05-18 NOTE — Telephone Encounter (Signed)
[facility] gave her a number for MD to call: [phone]

## 2012-05-20 MED ORDER — ATORVASTATIN CALCIUM 20 MG PO TABS: 20.0000 mg | ORAL_TABLET | Freq: Every day | ORAL | Status: DC

## 2012-05-20 NOTE — Telephone Encounter (Signed)
I sent script [facility]. 

## 2012-05-24 ENCOUNTER — Ambulatory Visit (INDEPENDENT_AMBULATORY_CARE_PROVIDER_SITE_OTHER): Payer: Medicare Other | Admitting: Urology

## 2012-05-24 ENCOUNTER — Other Ambulatory Visit: Payer: Self-pay | Admitting: Urology

## 2012-05-24 DIAGNOSIS — N390 Urinary tract infection, site not specified: Secondary | ICD-10-CM

## 2012-05-24 DIAGNOSIS — N1330 Unspecified hydronephrosis: Secondary | ICD-10-CM

## 2012-06-07 ENCOUNTER — Telehealth: Payer: Self-pay | Admitting: Family Medicine

## 2012-06-07 NOTE — Telephone Encounter (Signed)
Pt needs refill of clopidogrel (PLAVIX) 75 MG tablet. Pl send to   [facility]. Pt has only a few left and needs a rush put on this. Thanks!

## 2012-06-08 MED ORDER — CLOPIDOGREL BISULFATE 75 MG PO TABS: 75.0000 mg | ORAL_TABLET | Freq: Every day | ORAL | Status: DC

## 2012-06-08 NOTE — Telephone Encounter (Signed)
I sent script [facility]. 

## 2012-06-15 ENCOUNTER — Telehealth: Payer: Self-pay | Admitting: Family Medicine

## 2012-06-15 NOTE — Telephone Encounter (Signed)
Patient called because she is out of her Plavix. This prescription was filled on 06/08/12. Please assist.

## 2012-06-15 NOTE — Telephone Encounter (Signed)
I spoke with pt and she is waiting on the script to come in the mail, local pharmacy is going to give her a few to last until she gets her script in the mail.

## 2012-07-11 ENCOUNTER — Other Ambulatory Visit: Payer: Self-pay | Admitting: Family Medicine

## 2012-07-11 NOTE — Telephone Encounter (Signed)
Is pt still taking this and if so, can we refill?

## 2012-08-05 ENCOUNTER — Ambulatory Visit (INDEPENDENT_AMBULATORY_CARE_PROVIDER_SITE_OTHER): Payer: Medicare Other | Admitting: Family

## 2012-08-05 ENCOUNTER — Telehealth: Payer: Self-pay | Admitting: Family Medicine

## 2012-08-05 ENCOUNTER — Encounter: Payer: Self-pay | Admitting: Family

## 2012-08-05 VITALS — BP 124/80 | HR 83

## 2012-08-05 DIAGNOSIS — N390 Urinary tract infection, site not specified: Secondary | ICD-10-CM

## 2012-08-05 DIAGNOSIS — R319 Hematuria, unspecified: Secondary | ICD-10-CM

## 2012-08-05 LAB — POCT URINALYSIS DIPSTICK
Bilirubin, UA: NEGATIVE
Ketones, UA: NEGATIVE
Spec Grav, UA: 1.01
pH, UA: 7

## 2012-08-05 MED ORDER — NITROFURANTOIN MONOHYD MACRO 100 MG PO CAPS: 100.0000 mg | ORAL_CAPSULE | Freq: Two times a day (BID) | ORAL | Status: DC

## 2012-08-05 NOTE — Progress Notes (Signed)
Subjective:    Patient ID: Katherine Ware, female    DOB: 1940/10/12, 72 y.o.   MRN: [id number]  HPI 72 year old white female, nonsmoker, patient of Dr. Clent Ridges is in with complaints of burning with urination, frequency, blood in her urine x2 days and worsening. She has a history of Parkinson's disease and is incontinent of urine. Therefore, she wears a pad in her underwear. Denies any fever, chills or muscle aches.   Review of Systems  Constitutional: Negative.   Respiratory: Negative.   Cardiovascular: Negative.   Genitourinary: Positive for hematuria.  Musculoskeletal: Negative.   Skin: Negative.   Psychiatric/Behavioral: Negative.    Past Medical History  Diagnosis Date  . Hyperlipidemia   . Hypertension   . Asthma   . Hypokalemia   . Osteoporosis   . Osteoarthritis   . 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 Pearlean Brownie  . Parkinsonian syndrome     saw Darrol Angel NP   . Progressive supranuclear palsy     sees Dr. Dan Humphreys at [facility]   . Neurogenic bladder     sees Dr. Retta Diones   . Frequent UTI   . Bilateral hydronephrosis     History   Social History  . Marital Status: Married    Spouse Name: N/A    Number of Children: N/A  . Years of Education: N/A   Occupational History  . Not on file.   Social History Main Topics  . Smoking status: Never Smoker   . Smokeless tobacco: Never Used  . Alcohol Use: No  . Drug Use: No  . Sexually Active: Not on file   Other Topics Concern  . Not on file   Social History Narrative  . No narrative on file    Past Surgical History  Procedure Laterality Date  . Cholecystectomy    . Breast surgery      benign left breast cyst  . Knee surgery      left knee  . Carpal tunnel release      bilateral  . Epidural steroid      to lumbar spine 8-11 and 9-11  . Esophageal dilation      per Dr. Yancey Flemings   . Colonscopy  2010    per Dr. Marina Goodell, benign polyps, repeat 5 years   . Esophageal dilation      dr  Marina Goodell   .  Tubal ligation    . Benign  right breat      Family History  Problem Relation Age of Onset  . Cancer Mother   . Diabetes Mother   . Arthritis Father   . Heart disease Father   . Hypertension Father   . Stroke Father   . Diabetes Sister   . Diabetes Maternal Grandmother     Allergies  Allergen Reactions  . Sulfa Antibiotics     unknown    Current Outpatient Prescriptions on File Prior to Visit  Medication Sig Dispense Refill  . acetaminophen (TYLENOL) 500 MG tablet Take 500 mg by mouth every 6 (six) hours as needed.      Marland Kitchen aspirin 81 MG tablet Take 81 mg by mouth daily.      Marland Kitchen atorvastatin (LIPITOR) 20 MG tablet Take 1 tablet (20 mg total) by mouth daily.  90 tablet  3  . Calcium Carb-Vit D-Soy Isoflav 600-200-25 MG-UNIT-MG TABS daily  30 each  0  . carbidopa-levodopa (SINEMET IR) 25-100 MG per tablet Take 1 tablet by mouth 3 (  three) times daily.  270 tablet  3  . Cholecalciferol (VITAMIN D) 2000 UNITS CAPS Take by mouth.        . clopidogrel (PLAVIX) 75 MG tablet Take 1 tablet (75 mg total) by mouth daily.  90 tablet  3  . diclofenac sodium (VOLTAREN) 1 % GEL Apply 2 g topically 2 (two) times daily.  400 g  3  . docusate sodium (COLACE) 100 MG capsule Take 100 mg by mouth daily.      . feeding supplement (ENSURE) PUDG Take 1 Container by mouth 3 (three) times daily between meals.  30 Can  0  . HYDROcodone-acetaminophen (NORCO/VICODIN) 5-325 MG per tablet Take 1 tablet by mouth every 4 (four) hours as needed for pain.  540 tablet  1  . HYDROcodone-acetaminophen (VICODIN) 5-500 MG per tablet Take 1 tablet by mouth every 6 (six) hours as needed for pain.  15 tablet  0  . lisinopril (PRINIVIL,ZESTRIL) 20 MG tablet TAKE 1 TABLET DAILY  90 tablet  2  . metoprolol succinate (TOPROL-XL) 25 MG 24 hr tablet Take 1 tablet (25 mg total) by mouth daily.  90 tablet  3  . Multiple Vitamin (MULTIVITAMIN) tablet Take 1 tablet by mouth daily.      Marland Kitchen omeprazole (PRILOSEC) 20 MG capsule Take 20 mg  by mouth daily.      Marland Kitchen oxybutynin (DITROPAN) 5 MG tablet TAKE 1 TABLET TWICE A DAY  180 tablet  2  . venlafaxine (EFFEXOR) 37.5 MG tablet Take 1 tablet (37.5 mg total) by mouth daily.  90 tablet  3  . ciprofloxacin (CIPRO) 500 MG tablet Take 1 tablet (500 mg total) by mouth 2 (two) times daily.  20 tablet  0  . [DISCONTINUED] oxybutynin (DITROPAN) 5 MG tablet TAKE 1 TABLET TWICE A DAY  180 tablet  3   No current facility-administered medications on file prior to visit.    BP 124/80  Pulse 83  SpO2 96%chart    Objective:   Physical Exam  Constitutional: She is oriented to person, place, and time. She appears well-developed and well-nourished.  Neck: Normal range of motion. Neck supple.  Cardiovascular: Normal rate, regular rhythm and normal heart sounds.   Pulmonary/Chest: Effort normal and breath sounds normal.  Abdominal: Soft. Bowel sounds are normal.  Neurological: She is alert and oriented to person, place, and time.  Skin: Skin is warm and dry.  Psychiatric: She has a normal mood and affect.          Assessment & Plan:  Assessment: 1. Urinary tract infection 2. Dysuria 3. Hematuria  Plan: Urine culture sent. Treat with Macrobid 100 mg twice a day x7 days. Drink plenty of water. Avoid caffeine. Change pads as they are still well. Call the office with any questions or concerns. Recheck a schedule, and as needed.

## 2012-08-05 NOTE — Patient Instructions (Addendum)
Urinary Tract Infection  Urinary tract infections (UTIs) can develop anywhere along your urinary tract. Your urinary tract is your body's drainage system for removing wastes and extra water. Your urinary tract includes two kidneys, two ureters, a bladder, and a urethra. Your kidneys are a pair of bean-shaped organs. Each kidney is about the size of your fist. They are located below your ribs, one on each side of your spine.  CAUSES  Infections are caused by microbes, which are microscopic organisms, including fungi, viruses, and bacteria. These organisms are so small that they can only be seen through a microscope. Bacteria are the microbes that most commonly cause UTIs.  SYMPTOMS   Symptoms of UTIs may vary by age and gender of the patient and by the location of the infection. Symptoms in young women typically include a frequent and intense urge to urinate and a painful, burning feeling in the bladder or urethra during urination. Older women and men are more likely to be tired, shaky, and weak and have muscle aches and abdominal pain. A fever may mean the infection is in your kidneys. Other symptoms of a kidney infection include pain in your back or sides below the ribs, nausea, and vomiting.  DIAGNOSIS  To diagnose a UTI, your caregiver will ask you about your symptoms. Your caregiver also will ask to provide a urine sample. The urine sample will be tested for bacteria and white blood cells. White blood cells are made by your body to help fight infection.  TREATMENT   Typically, UTIs can be treated with medication. Because most UTIs are caused by a bacterial infection, they usually can be treated with the use of antibiotics. The choice of antibiotic and length of treatment depend on your symptoms and the type of bacteria causing your infection.  HOME CARE INSTRUCTIONS   If you were prescribed antibiotics, take them exactly as your caregiver instructs you. Finish the medication even if you feel better after you  have only taken some of the medication.   Drink enough water and fluids to keep your urine clear or pale yellow.   Avoid caffeine, tea, and carbonated beverages. They tend to irritate your bladder.   Empty your bladder often. Avoid holding urine for long periods of time.   Empty your bladder before and after sexual intercourse.   After a bowel movement, women should cleanse from front to back. Use each tissue only once.  SEEK MEDICAL CARE IF:    You have back pain.   You develop a fever.   Your symptoms do not begin to resolve within 3 days.  SEEK IMMEDIATE MEDICAL CARE IF:    You have severe back pain or lower abdominal pain.   You develop chills.   You have nausea or vomiting.   You have continued burning or discomfort with urination.  MAKE SURE YOU:    Understand these instructions.   Will watch your condition.   Will get help right away if you are not doing well or get worse.  Document Released: 10/22/2004 Document Revised: 07/14/2011 Document Reviewed: 02/20/2011  ExitCare Patient Information 2014 [employer].

## 2012-08-05 NOTE — Telephone Encounter (Signed)
Patient Information:  Caller Name: Gala Romney  Phone: [phone]  Patient: Katherine Ware, Katherine Ware  Gender: Female  DOB: Nov 26, 1940  Age: 72 Years  PCP: Gershon Crane [facility])  Office Follow Up:  Does the office need to follow up with this patient?: No  Instructions For The Office: N/A  RN Note:  pt denies any pain with urination.  Symptoms  Reason For Call & Symptoms: caller states pt has a pad all the time.  Caller states pt is passing blood. Pt sent a sample of urine to urologist that did not show anything.  Pt does reports some back pain on 08/02/12 but none since.  Urine does have a strong odor  Reviewed Health History In EMR: Yes  Reviewed Medications In EMR: Yes  Reviewed Allergies In EMR: Yes  Reviewed Surgeries / Procedures: Yes  Date of Onset of Symptoms: 07/28/2012  Guideline(s) Used:  Urination Pain - Female  Disposition Per Guideline:   See Today in Office  Reason For Disposition Reached:   Age > 50 years  Advice Given:  N/A  Patient Will Follow Care Advice:  YES  Appointment Scheduled:  08/05/2012 14:45:00 Appointment Scheduled Provider:  Adline Mango [facility])

## 2012-08-07 LAB — URINE CULTURE: Colony Count: 755

## 2012-08-10 ENCOUNTER — Telehealth: Payer: Self-pay | Admitting: Family Medicine

## 2012-08-10 NOTE — Telephone Encounter (Signed)
Patient Information:  Caller Name: Riley Lam  Phone: [phone]  Patient: Katherine Ware, Katherine Ware  Gender: Female  DOB: 1940/12/01  Age: 72 Years  PCP: Gershon Crane [facility])  Office Follow Up:  Does the office need to follow up with this patient?: Yes  Instructions For The Office: Call back needed regarding work in appointment.  RN Note:  Contines to have urinary frequency, intermittent hesitancy and ongoing hematuria; uses a pad in the adult brief. Dr Clent Ridges and P. Orvan Falconer, NP are out of the office.  No appointments remain  with any provider for 08/10/12, so message sent to office staff for call back regarding work in appointment.    Symptoms  Reason For Call & Symptoms: Continued hematura; taking Nitrofurantoin BID on 08/05/12 for UTI.  Reviewed Health History In EMR: Yes  Reviewed Medications In EMR: Yes  Reviewed Allergies In EMR: Yes  Reviewed Surgeries / Procedures: Yes  Date of Onset of Symptoms: 08/03/2012  Treatments Tried: Nitrofurantoin  Treatments Tried Worked: No  Guideline(s) Used:  Urination Pain - Female  Disposition Per Guideline:   See Today in Office  Reason For Disposition Reached:   Taking antibiotic > 3 days for UTI and painful urination not improved  Advice Given:  Fluids:   Drink extra fluids. Drink 8-10 glasses of liquids a day (Reason: to produce a dilute, non-irritating urine).  Call Back If:  You become worse.  Patient Will Follow Care Advice:  YES

## 2012-08-10 NOTE — Telephone Encounter (Signed)
I spoke with Katherine Ware and he is going to schedule office visit for Friday 08/12/12 to see Dr. Clent Ridges, also pt declined to see another provider.

## 2012-08-12 ENCOUNTER — Ambulatory Visit (INDEPENDENT_AMBULATORY_CARE_PROVIDER_SITE_OTHER): Payer: Medicare Other | Admitting: Family Medicine

## 2012-08-12 ENCOUNTER — Encounter: Payer: Self-pay | Admitting: Family Medicine

## 2012-08-12 VITALS — BP 136/80 | HR 76 | Temp 98.3°F

## 2012-08-12 DIAGNOSIS — R319 Hematuria, unspecified: Secondary | ICD-10-CM

## 2012-08-12 DIAGNOSIS — N390 Urinary tract infection, site not specified: Secondary | ICD-10-CM

## 2012-08-12 LAB — POCT URINALYSIS DIPSTICK
Glucose, UA: NEGATIVE
Nitrite, UA: NEGATIVE
Urobilinogen, UA: 0.2
pH, UA: 7

## 2012-08-12 MED ORDER — SULFAMETHOXAZOLE-TMP DS 800-160 MG PO TABS: 1.0000 | ORAL_TABLET | Freq: Two times a day (BID) | ORAL | Status: DC

## 2012-08-12 NOTE — Progress Notes (Signed)
  Subjective:    Patient ID: Katherine Ware, female    DOB: Jan 14, 1941, 72 y.o.   MRN: [id number]  HPI Here for one week of blood in the urine. She denies any pain or burning, no fever or nausea. She feels very tired. Drinking water.    Review of Systems  Constitutional: Negative.   Gastrointestinal: Negative.        Objective:   Physical Exam  Constitutional: She appears well-developed and well-nourished.  Abdominal: Soft. Bowel sounds are normal. She exhibits no distension and no mass. There is no tenderness. There is no rebound and no guarding.          Assessment & Plan:  Treat with Bactrim DS. Culture the sample

## 2012-08-15 ENCOUNTER — Telehealth: Payer: Self-pay | Admitting: Family Medicine

## 2012-08-15 NOTE — Telephone Encounter (Signed)
Pt's husband calling on behalf of pt.  He states Dr. Clent Ridges informed him to call in to request script for skin prep if they needed it. Requesting a script for Skin Prep Protective Wipes [phone], made by Katrinka Blazing and Aurther Loft sent to [employer] [facility] in [place].

## 2012-08-15 NOTE — Progress Notes (Signed)
Quick Note:  I spoke with pt ______ 

## 2012-08-16 MED ORDER — URO-PREP PROTECTIVE SKIN WIPES EX MISC: 1.0000 | Freq: Every day | CUTANEOUS | Status: DC | PRN

## 2012-08-16 NOTE — Telephone Encounter (Signed)
Okay, please take care of this

## 2012-08-16 NOTE — Telephone Encounter (Signed)
I sent script [facility]. 

## 2012-08-23 ENCOUNTER — Ambulatory Visit (INDEPENDENT_AMBULATORY_CARE_PROVIDER_SITE_OTHER): Payer: Medicare Other | Admitting: Urology

## 2012-08-23 ENCOUNTER — Ambulatory Visit (HOSPITAL_COMMUNITY)
Admission: RE | Admit: 2012-08-23 | Discharge: 2012-08-23 | Disposition: A | Discharge status: Home / Self Care | Payer: Medicare Other | Source: Ambulatory Visit | Attending: Urology | Admitting: Urology

## 2012-08-23 DIAGNOSIS — N390 Urinary tract infection, site not specified: Secondary | ICD-10-CM

## 2012-08-23 DIAGNOSIS — N1330 Unspecified hydronephrosis: Secondary | ICD-10-CM | POA: Insufficient documentation

## 2012-09-07 ENCOUNTER — Telehealth: Payer: Self-pay | Admitting: Family Medicine

## 2012-09-07 MED ORDER — DICLOFENAC SODIUM 1 % TD GEL: 2.0000 g | Freq: Two times a day (BID) | TRANSDERMAL | Status: DC

## 2012-09-07 NOTE — Telephone Encounter (Signed)
I called in 2 week supply of Sinemet IR to [facility] and also pt requested a refill on [facility]. I did handle this and spoke with pt.

## 2012-09-07 NOTE — Telephone Encounter (Signed)
Pt is going to run out of carbidopa-levodopa (SINEMET IR) 25-100 MG per tablet before her Annabell Sabal will arrive. Pt needs at least 2 weeks worth. Pharm: [facility]/[place]

## 2012-09-22 ENCOUNTER — Ambulatory Visit: Payer: Medicare Other | Admitting: Family Medicine

## 2012-09-22 ENCOUNTER — Encounter: Payer: Self-pay | Admitting: Family Medicine

## 2012-09-22 ENCOUNTER — Ambulatory Visit (INDEPENDENT_AMBULATORY_CARE_PROVIDER_SITE_OTHER): Payer: Medicare Other | Admitting: Family Medicine

## 2012-09-22 VITALS — BP 120/70 | HR 83 | Temp 98.6°F

## 2012-09-22 DIAGNOSIS — N390 Urinary tract infection, site not specified: Secondary | ICD-10-CM

## 2012-09-22 DIAGNOSIS — R8290 Unspecified abnormal findings in urine: Secondary | ICD-10-CM

## 2012-09-22 DIAGNOSIS — R82998 Other abnormal findings in urine: Secondary | ICD-10-CM

## 2012-09-22 LAB — POCT URINALYSIS DIPSTICK
Ketones, UA: NEGATIVE
Spec Grav, UA: 1.02
Urobilinogen, UA: 0.2

## 2012-09-22 MED ORDER — BACLOFEN 10 MG PO TABS: 10.0000 mg | ORAL_TABLET | Freq: Three times a day (TID) | ORAL | Status: DC

## 2012-09-22 NOTE — Progress Notes (Signed)
  Subjective:    Patient ID: Katherine Ware, female    DOB: 1940/06/24, 72 y.o.   MRN: [id number]  HPI Here for 3 days of burning on urinations and blood in the urine. No fever. She has had multiple UTIs this summer. She has usually responded better to Bactrim than to anything else. They have a course of Bactrim at home but were not sure whether to use it. Her fluid intake is good.    Review of Systems  Constitutional: Negative.   Genitourinary: Positive for dysuria and hematuria. Negative for urgency, frequency and flank pain.       Objective:   Physical Exam  Constitutional: She appears well-developed and well-nourished.  Abdominal: Soft. Bowel sounds are normal. She exhibits no distension and no mass. There is no tenderness. There is no rebound and no guarding.          Assessment & Plan:  She has another UTI so I told her to go ahead and take the Bactrim course. Since she has tolerated this well several times this summer and her hx of an allergic reaction to sulfa many years ago is questionable, we will take this off her allergy list.

## 2012-09-24 LAB — URINE CULTURE

## 2012-09-27 NOTE — Progress Notes (Signed)
Quick Note:  I spoke with pt's husband. ______

## 2012-10-04 ENCOUNTER — Telehealth: Payer: Self-pay | Admitting: Family Medicine

## 2012-10-04 NOTE — Telephone Encounter (Signed)
Refill request for Norco 5/325 mg take 1 po 4hrs prn and send to [facility].

## 2012-10-05 MED ORDER — HYDROCODONE-ACETAMINOPHEN 5-325 MG PO TABS: 1.0000 | ORAL_TABLET | ORAL | Status: DC | PRN

## 2012-10-05 NOTE — Telephone Encounter (Signed)
Script was faxed to [facility].  

## 2012-10-05 NOTE — Telephone Encounter (Signed)
Done, ready to fax  

## 2012-10-10 ENCOUNTER — Telehealth: Payer: Self-pay | Admitting: Family Medicine

## 2012-10-10 DIAGNOSIS — G231 Progressive supranuclear ophthalmoplegia [Steele-Richardson-Olszewski]: Secondary | ICD-10-CM

## 2012-10-10 NOTE — Telephone Encounter (Signed)
Pt needs a referral to Dr Boris Sharper [facility] [phone] for follow up  parkinsons dx and refills. Pt stated Ins requesting a new referral

## 2012-10-10 NOTE — Telephone Encounter (Signed)
The referral was done  

## 2012-10-11 ENCOUNTER — Ambulatory Visit (INDEPENDENT_AMBULATORY_CARE_PROVIDER_SITE_OTHER): Payer: Medicare Other | Admitting: Urology

## 2012-10-11 DIAGNOSIS — N390 Urinary tract infection, site not specified: Secondary | ICD-10-CM

## 2012-10-11 NOTE — Telephone Encounter (Signed)
I spoke with pt  

## 2012-10-13 ENCOUNTER — Ambulatory Visit (INDEPENDENT_AMBULATORY_CARE_PROVIDER_SITE_OTHER): Payer: Medicare Other

## 2012-10-13 DIAGNOSIS — Z23 Encounter for immunization: Secondary | ICD-10-CM

## 2012-11-19 ENCOUNTER — Other Ambulatory Visit: Payer: Self-pay | Admitting: Family Medicine

## 2012-11-30 ENCOUNTER — Other Ambulatory Visit: Payer: Self-pay | Admitting: Urology

## 2012-11-30 ENCOUNTER — Other Ambulatory Visit: Payer: Self-pay | Admitting: Family Medicine

## 2012-11-30 DIAGNOSIS — N390 Urinary tract infection, site not specified: Secondary | ICD-10-CM

## 2012-12-12 ENCOUNTER — Telehealth: Payer: Self-pay | Admitting: Family Medicine

## 2012-12-12 NOTE — Telephone Encounter (Signed)
Refill request for Plavix 75 mg and send to [facility].

## 2012-12-13 NOTE — Telephone Encounter (Signed)
Pt states she is out of this med. Would like a rush put on this order when [facility]

## 2012-12-14 MED ORDER — CLOPIDOGREL BISULFATE 75 MG PO TABS: 75.0000 mg | ORAL_TABLET | Freq: Every day | ORAL | Status: DC

## 2012-12-14 NOTE — Telephone Encounter (Signed)
Per Dr. Clent Ridges okay to give refill for a year, which I did send e-scribe.

## 2012-12-20 ENCOUNTER — Telehealth: Payer: Self-pay | Admitting: Family Medicine

## 2012-12-20 MED ORDER — CLOPIDOGREL BISULFATE 75 MG PO TABS: 75.0000 mg | ORAL_TABLET | Freq: Every day | ORAL | Status: AC

## 2012-12-20 NOTE — Telephone Encounter (Signed)
I had to resend script for Plavix to [facility].

## 2013-01-03 ENCOUNTER — Telehealth: Payer: Self-pay | Admitting: Family Medicine

## 2013-01-03 DIAGNOSIS — N319 Neuromuscular dysfunction of bladder, unspecified: Secondary | ICD-10-CM

## 2013-01-03 DIAGNOSIS — G231 Progressive supranuclear ophthalmoplegia [Steele-Richardson-Olszewski]: Secondary | ICD-10-CM

## 2013-01-03 NOTE — Telephone Encounter (Signed)
Pt called stating that even though she has been going to Dr. Dan Humphreys (neurologist) in [facility] for years she needs a referral to go see him.  Also pt's home care nurse states that her urine is cloudy and patient wants to know can Doxycycline 100 mg be called in for her? Pt will come in if she needs to, but pt is hoping that maybe Dr. Clent Ridges will call it in for her. Please advise.

## 2013-01-03 NOTE — Telephone Encounter (Signed)
Pt also would like a referral to see dr dahstedt for reoccurring uti. Pt has appt sch for 01-31-13. Pt has medicare

## 2013-01-04 MED ORDER — DOXYCYCLINE HYCLATE 100 MG PO TABS: 100.0000 mg | ORAL_TABLET | Freq: Two times a day (BID) | ORAL | Status: DC

## 2013-01-04 NOTE — Telephone Encounter (Signed)
Both referrals were done. Call in Doxycycline 100 mg bid for 10 days

## 2013-01-04 NOTE — Telephone Encounter (Signed)
I spoke with pt and send script e-scribe.

## 2013-01-31 ENCOUNTER — Ambulatory Visit (HOSPITAL_COMMUNITY)
Admission: RE | Admit: 2013-01-31 | Discharge: 2013-01-31 | Disposition: A | Discharge status: Home / Self Care | Payer: Medicare Other | Source: Ambulatory Visit | Attending: Urology | Admitting: Urology

## 2013-01-31 ENCOUNTER — Ambulatory Visit (INDEPENDENT_AMBULATORY_CARE_PROVIDER_SITE_OTHER): Payer: Medicare Other | Admitting: Urology

## 2013-01-31 DIAGNOSIS — N390 Urinary tract infection, site not specified: Secondary | ICD-10-CM

## 2013-01-31 DIAGNOSIS — N1330 Unspecified hydronephrosis: Secondary | ICD-10-CM | POA: Insufficient documentation

## 2013-01-31 DIAGNOSIS — N319 Neuromuscular dysfunction of bladder, unspecified: Secondary | ICD-10-CM | POA: Insufficient documentation

## 2013-01-31 DIAGNOSIS — I10 Essential (primary) hypertension: Secondary | ICD-10-CM | POA: Insufficient documentation

## 2013-03-01 ENCOUNTER — Other Ambulatory Visit: Payer: Self-pay | Admitting: Family Medicine

## 2013-03-28 ENCOUNTER — Ambulatory Visit (INDEPENDENT_AMBULATORY_CARE_PROVIDER_SITE_OTHER): Payer: Medicare Other | Admitting: Family Medicine

## 2013-03-28 ENCOUNTER — Encounter: Payer: Self-pay | Admitting: Family Medicine

## 2013-03-28 VITALS — BP 118/62 | HR 82 | Temp 98.7°F

## 2013-03-28 DIAGNOSIS — G231 Progressive supranuclear ophthalmoplegia [Steele-Richardson-Olszewski]: Secondary | ICD-10-CM

## 2013-03-28 DIAGNOSIS — I6789 Other cerebrovascular disease: Secondary | ICD-10-CM

## 2013-03-28 DIAGNOSIS — R339 Retention of urine, unspecified: Secondary | ICD-10-CM

## 2013-03-28 DIAGNOSIS — M545 Low back pain, unspecified: Secondary | ICD-10-CM

## 2013-03-28 DIAGNOSIS — IMO0002 Reserved for concepts with insufficient information to code with codable children: Secondary | ICD-10-CM

## 2013-03-28 DIAGNOSIS — M1990 Unspecified osteoarthritis, unspecified site: Secondary | ICD-10-CM

## 2013-03-28 DIAGNOSIS — I10 Essential (primary) hypertension: Secondary | ICD-10-CM

## 2013-03-28 DIAGNOSIS — K219 Gastro-esophageal reflux disease without esophagitis: Secondary | ICD-10-CM

## 2013-03-28 DIAGNOSIS — G20 Parkinson's disease: Secondary | ICD-10-CM

## 2013-03-28 DIAGNOSIS — Z86010 Personal history of colonic polyps: Secondary | ICD-10-CM

## 2013-03-28 DIAGNOSIS — Z23 Encounter for immunization: Secondary | ICD-10-CM

## 2013-03-28 DIAGNOSIS — G238 Other specified degenerative diseases of basal ganglia: Secondary | ICD-10-CM

## 2013-03-28 LAB — HEPATIC FUNCTION PANEL
ALBUMIN: 3.4 g/dL — AB (ref 3.5–5.2)
ALK PHOS: 91 U/L (ref 39–117)
ALT: 3 U/L (ref 0–35)
AST: 16 U/L (ref 0–37)
Bilirubin, Direct: 0 mg/dL (ref 0.0–0.3)
Total Bilirubin: 0.6 mg/dL (ref 0.3–1.2)
Total Protein: 6.8 g/dL (ref 6.0–8.3)

## 2013-03-28 LAB — CBC WITH DIFFERENTIAL/PLATELET
Basophils Absolute: 0 10*3/uL (ref 0.0–0.1)
Basophils Relative: 0.3 % (ref 0.0–3.0)
EOS ABS: 0.4 10*3/uL (ref 0.0–0.7)
Eosinophils Relative: 3.4 % (ref 0.0–5.0)
HCT: 39.1 % (ref 36.0–46.0)
HEMOGLOBIN: 12.7 g/dL (ref 12.0–15.0)
Lymphocytes Relative: 13.7 % (ref 12.0–46.0)
Lymphs Abs: 1.7 10*3/uL (ref 0.7–4.0)
MCHC: 32.5 g/dL (ref 30.0–36.0)
MCV: 92.9 fl (ref 78.0–100.0)
Monocytes Absolute: 0.7 10*3/uL (ref 0.1–1.0)
Monocytes Relative: 5.4 % (ref 3.0–12.0)
NEUTROS ABS: 9.3 10*3/uL — AB (ref 1.4–7.7)
Neutrophils Relative %: 77.2 % — ABNORMAL HIGH (ref 43.0–77.0)
Platelets: 289 10*3/uL (ref 150.0–400.0)
RBC: 4.21 Mil/uL (ref 3.87–5.11)
RDW: 14.2 % (ref 11.5–14.6)
WBC: 12.1 10*3/uL — ABNORMAL HIGH (ref 4.5–10.5)

## 2013-03-28 LAB — BASIC METABOLIC PANEL
BUN: 8 mg/dL (ref 6–23)
CHLORIDE: 102 meq/L (ref 96–112)
CO2: 27 meq/L (ref 19–32)
Calcium: 8.7 mg/dL (ref 8.4–10.5)
Creatinine, Ser: 0.9 mg/dL (ref 0.4–1.2)
GFR: 67.32 mL/min (ref >60.00)
GLUCOSE: 81 mg/dL (ref 70–99)
POTASSIUM: 3.7 meq/L (ref 3.5–5.1)
SODIUM: 138 meq/L (ref 135–145)

## 2013-03-28 LAB — LIPID PANEL
Cholesterol: 125 mg/dL (ref 0–200)
HDL: 36.7 mg/dL — ABNORMAL LOW (ref >39.00)
LDL Cholesterol: 41 mg/dL (ref 0–99)
Total CHOL/HDL Ratio: 3
Triglycerides: 236 mg/dL — ABNORMAL HIGH (ref 0.0–149.0)
VLDL: 47.2 mg/dL — ABNORMAL HIGH (ref 0.0–40.0)

## 2013-03-28 LAB — TSH: TSH: 1.25 u[IU]/mL (ref 0.35–5.50)

## 2013-03-28 MED ORDER — HYDROCHLOROTHIAZIDE 25 MG PO TABS: ORAL_TABLET | ORAL | Status: DC

## 2013-03-28 MED ORDER — METOPROLOL SUCCINATE ER 25 MG PO TB24: ORAL_TABLET | ORAL | Status: DC

## 2013-03-28 MED ORDER — HYDROCODONE-ACETAMINOPHEN 5-325 MG PO TABS: 1.0000 | ORAL_TABLET | Freq: Four times a day (QID) | ORAL | Status: DC | PRN

## 2013-03-28 MED ORDER — OXYBUTYNIN CHLORIDE 5 MG PO TABS: ORAL_TABLET | ORAL | Status: DC

## 2013-03-28 NOTE — Progress Notes (Signed)
Pre visit review using our clinic review tool, if applicable. No additional management support is needed unless otherwise documented below in the visit note. 

## 2013-03-28 NOTE — Progress Notes (Signed)
   Subjective:    Patient ID: Katherine Ware, female    DOB: [birth year], 73 y.o.   MRN: [id number]  HPI 73 yr old female for a cpx. She seems to be doing well. She saw Dr. Diona Fanti recently and her hydronephrosis is stable. She is now doing in and out caths BID.  Her BP is stable. She will see Dr. Redmond Pulling in 2 weeks.    Review of Systems  HENT: Negative.   Eyes: Negative.   Respiratory: Negative.   Cardiovascular: Negative.   Gastrointestinal: Negative.   Genitourinary: Negative for dysuria, urgency, frequency, hematuria, flank pain, decreased urine volume, enuresis, difficulty urinating, pelvic pain and dyspareunia.  Musculoskeletal: Negative.   Skin: Negative.   Neurological: Positive for weakness. Negative for dizziness, tremors, seizures, syncope, facial asymmetry, speech difficulty, light-headedness, numbness and headaches.  Psychiatric/Behavioral: Negative.        Objective:   Physical Exam  Constitutional: She is oriented to person, place, and time. She appears well-developed and well-nourished.  In her wheelchair   HENT:  Head: Normocephalic and atraumatic.  Right Ear: External ear normal.  Left Ear: External ear normal.  Nose: Nose normal.  Mouth/Throat: Oropharynx is clear and moist. No oropharyngeal exudate.  Eyes: Conjunctivae and EOM are normal. Pupils are equal, round, and reactive to light. No scleral icterus.  Neck: Normal range of motion. Neck supple. No JVD present. No thyromegaly present.  Cardiovascular: Normal rate, regular rhythm, normal heart sounds and intact distal pulses.  Exam reveals no gallop and no friction rub.   No murmur heard. Pulmonary/Chest: Effort normal and breath sounds normal. No respiratory distress. She has no wheezes. She has no rales. She exhibits no tenderness.  Abdominal: Soft. Bowel sounds are normal. She exhibits no distension and no mass. There is no tenderness. There is no rebound and no guarding.  Musculoskeletal: Normal range of  motion. She exhibits no edema and no tenderness.  Lymphadenopathy:    She has no cervical adenopathy.  Neurological: She is alert and oriented to person, place, and time. No cranial nerve deficit.  Skin: Skin is warm and dry. No rash noted. No erythema.  Psychiatric: She has a normal mood and affect. Her behavior is normal. Judgment and thought content normal.          Assessment & Plan:  Get fasting labs. Given a Prevnar booster. She is due for a follow up colonoscopy.

## 2013-03-29 ENCOUNTER — Telehealth: Payer: Self-pay | Admitting: Family Medicine

## 2013-03-29 NOTE — Telephone Encounter (Signed)
Relevant patient education mailed to patient.  

## 2013-04-20 ENCOUNTER — Encounter: Payer: Self-pay | Admitting: Family Medicine

## 2013-04-25 ENCOUNTER — Telehealth: Payer: Self-pay | Admitting: Family Medicine

## 2013-04-25 NOTE — Telephone Encounter (Signed)
Pt's daughterin law called for pt. Pt's gynecologist retired last year, dr Ubaldo Glassing. Pt would like to know if Dr Sarajane Jews will take over her obgyn needs or does pt need to find an obgyn dr. Abbott Pao has not seen a gynecologist in over 5 years

## 2013-04-26 NOTE — Telephone Encounter (Signed)
Yes we can take this over from now on

## 2013-04-27 NOTE — Telephone Encounter (Signed)
I spoke with Katherine Ware.

## 2013-05-08 ENCOUNTER — Encounter: Payer: Self-pay | Admitting: Internal Medicine

## 2013-05-17 ENCOUNTER — Encounter: Payer: Self-pay | Admitting: Internal Medicine

## 2013-05-27 ENCOUNTER — Other Ambulatory Visit: Payer: Self-pay | Admitting: Family Medicine

## 2013-06-27 ENCOUNTER — Encounter: Payer: Medicare Other | Admitting: Internal Medicine

## 2013-07-05 ENCOUNTER — Ambulatory Visit: Payer: Medicare Other | Admitting: Internal Medicine

## 2013-07-11 ENCOUNTER — Other Ambulatory Visit: Payer: Self-pay | Admitting: Urology

## 2013-07-11 DIAGNOSIS — N1330 Unspecified hydronephrosis: Secondary | ICD-10-CM

## 2013-07-12 ENCOUNTER — Telehealth: Payer: Self-pay | Admitting: Family Medicine

## 2013-07-12 NOTE — Telephone Encounter (Signed)
done

## 2013-07-12 NOTE — Telephone Encounter (Signed)
Pt husband called she received a jury summon and he is requesting a letter to excuse her from this because of her condition  Pt said he will need to pick this up on 07/13/13

## 2013-07-13 NOTE — Telephone Encounter (Signed)
Note is ready for pick up and I spoke with family member.

## 2013-08-02 ENCOUNTER — Ambulatory Visit (HOSPITAL_COMMUNITY)
Admission: RE | Admit: 2013-08-02 | Discharge: 2013-08-02 | Disposition: A | Discharge status: Home / Self Care | Payer: Medicare Other | Source: Ambulatory Visit | Attending: Urology | Admitting: Urology

## 2013-08-02 DIAGNOSIS — N1330 Unspecified hydronephrosis: Secondary | ICD-10-CM | POA: Insufficient documentation

## 2013-08-04 ENCOUNTER — Inpatient Hospital Stay (HOSPITAL_COMMUNITY)
Admission: EM | Admit: 2013-08-04 | Discharge: 2013-08-26 | DRG: 871 | Disposition: E | Payer: Medicare Other | Attending: Internal Medicine | Admitting: Internal Medicine

## 2013-08-04 ENCOUNTER — Encounter (HOSPITAL_COMMUNITY): Payer: Self-pay | Admitting: Emergency Medicine

## 2013-08-04 ENCOUNTER — Emergency Department (HOSPITAL_COMMUNITY): Payer: Medicare Other

## 2013-08-04 DIAGNOSIS — G20A1 Parkinson's disease without dyskinesia, without mention of fluctuations: Secondary | ICD-10-CM | POA: Diagnosis present

## 2013-08-04 DIAGNOSIS — I10 Essential (primary) hypertension: Secondary | ICD-10-CM | POA: Diagnosis present

## 2013-08-04 DIAGNOSIS — R5381 Other malaise: Secondary | ICD-10-CM | POA: Diagnosis present

## 2013-08-04 DIAGNOSIS — Z7401 Bed confinement status: Secondary | ICD-10-CM

## 2013-08-04 DIAGNOSIS — N1330 Unspecified hydronephrosis: Secondary | ICD-10-CM | POA: Diagnosis present

## 2013-08-04 DIAGNOSIS — Z8673 Personal history of transient ischemic attack (TIA), and cerebral infarction without residual deficits: Secondary | ICD-10-CM

## 2013-08-04 DIAGNOSIS — N179 Acute kidney failure, unspecified: Secondary | ICD-10-CM | POA: Diagnosis present

## 2013-08-04 DIAGNOSIS — M1990 Unspecified osteoarthritis, unspecified site: Secondary | ICD-10-CM | POA: Diagnosis present

## 2013-08-04 DIAGNOSIS — Z66 Do not resuscitate: Secondary | ICD-10-CM | POA: Diagnosis present

## 2013-08-04 DIAGNOSIS — Z515 Encounter for palliative care: Secondary | ICD-10-CM

## 2013-08-04 DIAGNOSIS — N390 Urinary tract infection, site not specified: Secondary | ICD-10-CM | POA: Diagnosis present

## 2013-08-04 DIAGNOSIS — G231 Progressive supranuclear ophthalmoplegia [Steele-Richardson-Olszewski]: Secondary | ICD-10-CM | POA: Diagnosis present

## 2013-08-04 DIAGNOSIS — Z7982 Long term (current) use of aspirin: Secondary | ICD-10-CM

## 2013-08-04 DIAGNOSIS — A419 Sepsis, unspecified organism: Secondary | ICD-10-CM | POA: Diagnosis not present

## 2013-08-04 DIAGNOSIS — G9340 Encephalopathy, unspecified: Secondary | ICD-10-CM | POA: Diagnosis present

## 2013-08-04 DIAGNOSIS — K219 Gastro-esophageal reflux disease without esophagitis: Secondary | ICD-10-CM | POA: Diagnosis present

## 2013-08-04 DIAGNOSIS — E876 Hypokalemia: Secondary | ICD-10-CM | POA: Diagnosis present

## 2013-08-04 DIAGNOSIS — E785 Hyperlipidemia, unspecified: Secondary | ICD-10-CM | POA: Diagnosis present

## 2013-08-04 DIAGNOSIS — E872 Acidosis, unspecified: Secondary | ICD-10-CM | POA: Diagnosis present

## 2013-08-04 DIAGNOSIS — R6520 Severe sepsis without septic shock: Secondary | ICD-10-CM

## 2013-08-04 DIAGNOSIS — G20 Parkinson's disease: Secondary | ICD-10-CM | POA: Diagnosis present

## 2013-08-04 DIAGNOSIS — J690 Pneumonitis due to inhalation of food and vomit: Secondary | ICD-10-CM | POA: Diagnosis present

## 2013-08-04 DIAGNOSIS — R339 Retention of urine, unspecified: Secondary | ICD-10-CM | POA: Diagnosis present

## 2013-08-04 DIAGNOSIS — R6521 Severe sepsis with septic shock: Secondary | ICD-10-CM

## 2013-08-04 DIAGNOSIS — K5900 Constipation, unspecified: Secondary | ICD-10-CM | POA: Diagnosis present

## 2013-08-04 DIAGNOSIS — Z79899 Other long term (current) drug therapy: Secondary | ICD-10-CM

## 2013-08-04 DIAGNOSIS — R5383 Other fatigue: Secondary | ICD-10-CM | POA: Diagnosis not present

## 2013-08-04 DIAGNOSIS — M810 Age-related osteoporosis without current pathological fracture: Secondary | ICD-10-CM | POA: Diagnosis present

## 2013-08-04 DIAGNOSIS — G20C Parkinsonism, unspecified: Secondary | ICD-10-CM | POA: Diagnosis present

## 2013-08-04 DIAGNOSIS — B961 Klebsiella pneumoniae [K. pneumoniae] as the cause of diseases classified elsewhere: Secondary | ICD-10-CM | POA: Diagnosis present

## 2013-08-04 DIAGNOSIS — R1310 Dysphagia, unspecified: Secondary | ICD-10-CM | POA: Diagnosis present

## 2013-08-04 DIAGNOSIS — A498 Other bacterial infections of unspecified site: Secondary | ICD-10-CM

## 2013-08-04 DIAGNOSIS — Z1612 Extended spectrum beta lactamase (ESBL) resistance: Secondary | ICD-10-CM

## 2013-08-04 DIAGNOSIS — G238 Other specified degenerative diseases of basal ganglia: Secondary | ICD-10-CM | POA: Diagnosis present

## 2013-08-04 DIAGNOSIS — J45909 Unspecified asthma, uncomplicated: Secondary | ICD-10-CM | POA: Diagnosis present

## 2013-08-04 DIAGNOSIS — N319 Neuromuscular dysfunction of bladder, unspecified: Secondary | ICD-10-CM | POA: Diagnosis present

## 2013-08-04 DIAGNOSIS — J9600 Acute respiratory failure, unspecified whether with hypoxia or hypercapnia: Secondary | ICD-10-CM | POA: Diagnosis present

## 2013-08-04 LAB — COMPREHENSIVE METABOLIC PANEL
ALK PHOS: 126 U/L — AB (ref 39–117)
ALT: 12 U/L (ref 0–35)
ANION GAP: 18 — AB (ref 5–15)
AST: 116 U/L — ABNORMAL HIGH (ref 0–37)
Albumin: 2 g/dL — ABNORMAL LOW (ref 3.5–5.2)
BILIRUBIN TOTAL: 0.5 mg/dL (ref 0.3–1.2)
BUN: 13 mg/dL (ref 6–23)
CHLORIDE: 101 meq/L (ref 96–112)
CO2: 18 mEq/L — ABNORMAL LOW (ref 19–32)
Calcium: 7.7 mg/dL — ABNORMAL LOW (ref 8.4–10.5)
Creatinine, Ser: 1.43 mg/dL — ABNORMAL HIGH (ref 0.50–1.10)
GFR calc non Af Amer: 36 mL/min — ABNORMAL LOW (ref >90)
GFR, EST AFRICAN AMERICAN: 42 mL/min — AB (ref >90)
GLUCOSE: 147 mg/dL — AB (ref 70–99)
Potassium: 4.2 mEq/L (ref 3.7–5.3)
Sodium: 137 mEq/L (ref 137–147)
TOTAL PROTEIN: 5.4 g/dL — AB (ref 6.0–8.3)

## 2013-08-04 LAB — CBC WITH DIFFERENTIAL/PLATELET
BASOS ABS: 0 10*3/uL (ref 0.0–0.1)
BASOS PCT: 0 % (ref 0–1)
EOS ABS: 0 10*3/uL (ref 0.0–0.7)
Eosinophils Relative: 0 % (ref 0–5)
HEMATOCRIT: 42.2 % (ref 36.0–46.0)
HEMOGLOBIN: 13.7 g/dL (ref 12.0–15.0)
LYMPHS ABS: 1.5 10*3/uL (ref 0.7–4.0)
Lymphocytes Relative: 4 % — ABNORMAL LOW (ref 12–46)
MCH: 30.2 pg (ref 26.0–34.0)
MCHC: 32.5 g/dL (ref 30.0–36.0)
MCV: 93 fL (ref 78.0–100.0)
Monocytes Absolute: 1.8 10*3/uL — ABNORMAL HIGH (ref 0.1–1.0)
Monocytes Relative: 5 % (ref 3–12)
Neutro Abs: 33.5 10*3/uL — ABNORMAL HIGH (ref 1.7–7.7)
Neutrophils Relative %: 91 % — ABNORMAL HIGH (ref 43–77)
Platelets: 324 10*3/uL (ref 150–400)
RBC: 4.54 MIL/uL (ref 3.87–5.11)
RDW: 13.5 % (ref 11.5–15.5)
WBC Morphology: INCREASED
WBC: 36.8 10*3/uL — ABNORMAL HIGH (ref 4.0–10.5)

## 2013-08-04 LAB — I-STAT ARTERIAL BLOOD GAS, ED
Acid-base deficit: 7 mmol/L — ABNORMAL HIGH (ref 0.0–2.0)
Bicarbonate: 16.9 mEq/L — ABNORMAL LOW (ref 20.0–24.0)
O2 Saturation: 95 %
PH ART: 7.35 (ref 7.350–7.450)
PO2 ART: 87 mmHg (ref 80.0–100.0)
Patient temperature: 101.4
TCO2: 18 mmol/L (ref 0–100)
pCO2 arterial: 31.2 mmHg — ABNORMAL LOW (ref 35.0–45.0)

## 2013-08-04 LAB — URINE MICROSCOPIC-ADD ON

## 2013-08-04 LAB — URINALYSIS, ROUTINE W REFLEX MICROSCOPIC
BILIRUBIN URINE: NEGATIVE
Glucose, UA: NEGATIVE mg/dL
Ketones, ur: 15 mg/dL — AB
NITRITE: NEGATIVE
Specific Gravity, Urine: 1.022 (ref 1.005–1.030)
Urobilinogen, UA: 0.2 mg/dL (ref 0.0–1.0)
pH: 6.5 (ref 5.0–8.0)

## 2013-08-04 LAB — I-STAT CG4 LACTIC ACID, ED: LACTIC ACID, VENOUS: 4.37 mmol/L — AB (ref 0.5–2.2)

## 2013-08-04 LAB — PROCALCITONIN: PROCALCITONIN: 6.65 ng/mL

## 2013-08-04 MED ORDER — ACETAMINOPHEN 650 MG RE SUPP
650.0000 mg | Freq: Once | RECTAL | Status: AC
  Administered 2013-08-04: 650 mg via RECTAL
  Filled 2013-08-04: qty 1

## 2013-08-04 MED ORDER — SODIUM CHLORIDE 0.9 % IV BOLUS (SEPSIS)
1000.0000 mL | Freq: Once | INTRAVENOUS | Status: AC
  Administered 2013-08-04: 1000 mL via INTRAVENOUS

## 2013-08-04 MED ORDER — SODIUM CHLORIDE 0.9 % IV SOLN
1000.0000 mL | Freq: Once | INTRAVENOUS | Status: AC
  Administered 2013-08-04: 1000 mL via INTRAVENOUS

## 2013-08-04 MED ORDER — SODIUM CHLORIDE 0.9 % IV BOLUS (SEPSIS): 2500.0000 mL | Freq: Once | INTRAVENOUS | Status: DC

## 2013-08-04 MED ORDER — PIPERACILLIN-TAZOBACTAM 3.375 G IVPB 30 MIN
3.3750 g | Freq: Once | INTRAVENOUS | Status: AC
  Administered 2013-08-04: 3.375 g via INTRAVENOUS
  Filled 2013-08-04: qty 50

## 2013-08-04 MED ORDER — SODIUM CHLORIDE 0.9 % IV SOLN: 1000.0000 mL | INTRAVENOUS | Status: DC

## 2013-08-04 MED ORDER — VANCOMYCIN HCL 10 G IV SOLR
1500.0000 mg | Freq: Once | INTRAVENOUS | Status: AC
  Administered 2013-08-04: 1500 mg via INTRAVENOUS
  Filled 2013-08-04: qty 1500

## 2013-08-04 NOTE — ED Notes (Signed)
Red streaking noted on left arm where Vancomycin is running.  Per CCMD, infusion to be slowed down to 200/hr.

## 2013-08-04 NOTE — ED Notes (Signed)
Resident at bedside talking to family regarding plan of care.

## 2013-08-04 NOTE — ED Notes (Signed)
Per EMS: pt from home with hypotension and altered level of consciousness.  Pt's BP with EMS 60/50; pt has received 1 500 cc bolus, 2nd currently infusing.  Pt lethargic on arrival.  Pt recently had all her teeth extracted 2 weeks ago.  Pt unable to move extremities at baseline.  Pt is normally verbal at home, has not be verbal today.

## 2013-08-05 ENCOUNTER — Emergency Department (HOSPITAL_COMMUNITY): Payer: Medicare Other

## 2013-08-05 DIAGNOSIS — R1310 Dysphagia, unspecified: Secondary | ICD-10-CM | POA: Diagnosis present

## 2013-08-05 DIAGNOSIS — Z7401 Bed confinement status: Secondary | ICD-10-CM | POA: Diagnosis not present

## 2013-08-05 DIAGNOSIS — A419 Sepsis, unspecified organism: Secondary | ICD-10-CM | POA: Diagnosis present

## 2013-08-05 DIAGNOSIS — J9600 Acute respiratory failure, unspecified whether with hypoxia or hypercapnia: Secondary | ICD-10-CM | POA: Diagnosis present

## 2013-08-05 DIAGNOSIS — G238 Other specified degenerative diseases of basal ganglia: Secondary | ICD-10-CM | POA: Diagnosis present

## 2013-08-05 DIAGNOSIS — J45909 Unspecified asthma, uncomplicated: Secondary | ICD-10-CM | POA: Diagnosis present

## 2013-08-05 DIAGNOSIS — Z515 Encounter for palliative care: Secondary | ICD-10-CM | POA: Diagnosis not present

## 2013-08-05 DIAGNOSIS — K219 Gastro-esophageal reflux disease without esophagitis: Secondary | ICD-10-CM | POA: Diagnosis present

## 2013-08-05 DIAGNOSIS — K5900 Constipation, unspecified: Secondary | ICD-10-CM | POA: Diagnosis present

## 2013-08-05 DIAGNOSIS — M1990 Unspecified osteoarthritis, unspecified site: Secondary | ICD-10-CM | POA: Diagnosis present

## 2013-08-05 DIAGNOSIS — G9340 Encephalopathy, unspecified: Secondary | ICD-10-CM | POA: Diagnosis present

## 2013-08-05 DIAGNOSIS — I10 Essential (primary) hypertension: Secondary | ICD-10-CM | POA: Diagnosis present

## 2013-08-05 DIAGNOSIS — E876 Hypokalemia: Secondary | ICD-10-CM | POA: Diagnosis present

## 2013-08-05 DIAGNOSIS — Z8673 Personal history of transient ischemic attack (TIA), and cerebral infarction without residual deficits: Secondary | ICD-10-CM | POA: Diagnosis not present

## 2013-08-05 DIAGNOSIS — J690 Pneumonitis due to inhalation of food and vomit: Secondary | ICD-10-CM | POA: Diagnosis present

## 2013-08-05 DIAGNOSIS — E785 Hyperlipidemia, unspecified: Secondary | ICD-10-CM | POA: Diagnosis present

## 2013-08-05 DIAGNOSIS — N179 Acute kidney failure, unspecified: Secondary | ICD-10-CM | POA: Diagnosis present

## 2013-08-05 DIAGNOSIS — Z79899 Other long term (current) drug therapy: Secondary | ICD-10-CM | POA: Diagnosis not present

## 2013-08-05 DIAGNOSIS — R5381 Other malaise: Secondary | ICD-10-CM | POA: Diagnosis present

## 2013-08-05 DIAGNOSIS — R6521 Severe sepsis with septic shock: Secondary | ICD-10-CM

## 2013-08-05 DIAGNOSIS — N390 Urinary tract infection, site not specified: Secondary | ICD-10-CM | POA: Diagnosis present

## 2013-08-05 DIAGNOSIS — G20 Parkinson's disease: Secondary | ICD-10-CM | POA: Diagnosis present

## 2013-08-05 DIAGNOSIS — Z66 Do not resuscitate: Secondary | ICD-10-CM | POA: Diagnosis present

## 2013-08-05 DIAGNOSIS — Z7982 Long term (current) use of aspirin: Secondary | ICD-10-CM | POA: Diagnosis not present

## 2013-08-05 DIAGNOSIS — N1330 Unspecified hydronephrosis: Secondary | ICD-10-CM | POA: Diagnosis present

## 2013-08-05 DIAGNOSIS — E872 Acidosis, unspecified: Secondary | ICD-10-CM | POA: Diagnosis present

## 2013-08-05 DIAGNOSIS — R6520 Severe sepsis without septic shock: Secondary | ICD-10-CM | POA: Diagnosis present

## 2013-08-05 DIAGNOSIS — N319 Neuromuscular dysfunction of bladder, unspecified: Secondary | ICD-10-CM | POA: Diagnosis present

## 2013-08-05 DIAGNOSIS — B961 Klebsiella pneumoniae [K. pneumoniae] as the cause of diseases classified elsewhere: Secondary | ICD-10-CM | POA: Diagnosis present

## 2013-08-05 DIAGNOSIS — M810 Age-related osteoporosis without current pathological fracture: Secondary | ICD-10-CM | POA: Diagnosis present

## 2013-08-05 LAB — BASIC METABOLIC PANEL
Anion gap: 17 — ABNORMAL HIGH (ref 5–15)
BUN: 14 mg/dL (ref 6–23)
CALCIUM: 6.9 mg/dL — AB (ref 8.4–10.5)
CO2: 15 mEq/L — ABNORMAL LOW (ref 19–32)
Chloride: 110 mEq/L (ref 96–112)
Creatinine, Ser: 1.34 mg/dL — ABNORMAL HIGH (ref 0.50–1.10)
GFR calc non Af Amer: 39 mL/min — ABNORMAL LOW (ref >90)
GFR, EST AFRICAN AMERICAN: 45 mL/min — AB (ref >90)
Glucose, Bld: 161 mg/dL — ABNORMAL HIGH (ref 70–99)
POTASSIUM: 4.4 meq/L (ref 3.7–5.3)
SODIUM: 142 meq/L (ref 137–147)

## 2013-08-05 LAB — URINALYSIS, ROUTINE W REFLEX MICROSCOPIC
Glucose, UA: NEGATIVE mg/dL
KETONES UR: 15 mg/dL — AB
Nitrite: POSITIVE — AB
Specific Gravity, Urine: 1.011 (ref 1.005–1.030)
UROBILINOGEN UA: 0.2 mg/dL (ref 0.0–1.0)
pH: 6 (ref 5.0–8.0)

## 2013-08-05 LAB — LACTIC ACID, PLASMA: Lactic Acid, Venous: 3.2 mmol/L — ABNORMAL HIGH (ref 0.5–2.2)

## 2013-08-05 LAB — GLUCOSE, CAPILLARY
GLUCOSE-CAPILLARY: 135 mg/dL — AB (ref 70–99)
Glucose-Capillary: 163 mg/dL — ABNORMAL HIGH (ref 70–99)
Glucose-Capillary: 132 mg/dL — ABNORMAL HIGH (ref 70–99)
Glucose-Capillary: 117 mg/dL — ABNORMAL HIGH (ref 70–99)
Glucose-Capillary: 145 mg/dL — ABNORMAL HIGH (ref 70–99)
Glucose-Capillary: 125 mg/dL — ABNORMAL HIGH (ref 70–99)

## 2013-08-05 LAB — CBC
HCT: 36.7 % (ref 36.0–46.0)
Hemoglobin: 11.7 g/dL — ABNORMAL LOW (ref 12.0–15.0)
MCH: 29.6 pg (ref 26.0–34.0)
MCHC: 31.9 g/dL (ref 30.0–36.0)
MCV: 92.9 fL (ref 78.0–100.0)
Platelets: 286 10*3/uL (ref 150–400)
RBC: 3.95 MIL/uL (ref 3.87–5.11)
RDW: 13.6 % (ref 11.5–15.5)
WBC: 42.5 10*3/uL — ABNORMAL HIGH (ref 4.0–10.5)

## 2013-08-05 LAB — URINE MICROSCOPIC-ADD ON

## 2013-08-05 LAB — PHOSPHORUS: PHOSPHORUS: 3.9 mg/dL (ref 2.3–4.6)

## 2013-08-05 LAB — MAGNESIUM: MAGNESIUM: 1.3 mg/dL — AB (ref 1.5–2.5)

## 2013-08-05 LAB — MRSA PCR SCREENING: MRSA BY PCR: NEGATIVE

## 2013-08-05 MED ORDER — VITAMIN D 1000 UNITS PO TABS
2000.0000 [IU] | ORAL_TABLET | Freq: Every day | ORAL | Status: DC
  Administered 2013-08-05 – 2013-08-11 (×7): 2000 [IU] via ORAL
  Filled 2013-08-05 (×8): qty 2

## 2013-08-05 MED ORDER — DOCUSATE SODIUM 100 MG PO CAPS
100.0000 mg | ORAL_CAPSULE | Freq: Every day | ORAL | Status: DC
  Administered 2013-08-05 – 2013-08-11 (×7): 100 mg via ORAL
  Filled 2013-08-05 (×7): qty 1

## 2013-08-05 MED ORDER — DOPAMINE-DEXTROSE 3.2-5 MG/ML-% IV SOLN
INTRAVENOUS | Status: AC
  Filled 2013-08-05: qty 250

## 2013-08-05 MED ORDER — DEXTROSE 5 % IV SOLN
2.0000 ug/min | INTRAVENOUS | Status: DC
  Administered 2013-08-05: 2 ug/min via INTRAVENOUS

## 2013-08-05 MED ORDER — VANCOMYCIN HCL IN DEXTROSE 1-5 GM/200ML-% IV SOLN
1000.0000 mg | INTRAVENOUS | Status: DC
  Administered 2013-08-05: 1000 mg via INTRAVENOUS
  Filled 2013-08-05 (×2): qty 200

## 2013-08-05 MED ORDER — IPRATROPIUM-ALBUTEROL 0.5-2.5 (3) MG/3ML IN SOLN: 3.0000 mL | RESPIRATORY_TRACT | Status: DC | PRN

## 2013-08-05 MED ORDER — VENLAFAXINE HCL 37.5 MG PO TABS
37.5000 mg | ORAL_TABLET | Freq: Two times a day (BID) | ORAL | Status: DC
  Administered 2013-08-05 – 2013-08-11 (×14): 37.5 mg via ORAL
  Filled 2013-08-05 (×18): qty 1

## 2013-08-05 MED ORDER — KCL IN DEXTROSE-NACL 10-5-0.45 MEQ/L-%-% IV SOLN
INTRAVENOUS | Status: DC
  Administered 2013-08-05 – 2013-08-06 (×2): via INTRAVENOUS
  Filled 2013-08-05 (×4): qty 1000

## 2013-08-05 MED ORDER — FENTANYL CITRATE 0.05 MG/ML IJ SOLN
25.0000 ug | INTRAMUSCULAR | Status: DC | PRN
  Administered 2013-08-05 – 2013-08-06 (×2): 25 ug via INTRAVENOUS
  Filled 2013-08-05 (×2): qty 2

## 2013-08-05 MED ORDER — DOPAMINE-DEXTROSE 3.2-5 MG/ML-% IV SOLN
2.0000 ug/kg/min | INTRAVENOUS | Status: DC
  Administered 2013-08-05: 5 ug/kg/min via INTRAVENOUS

## 2013-08-05 MED ORDER — HEPARIN SODIUM (PORCINE) 5000 UNIT/ML IJ SOLN
5000.0000 [IU] | Freq: Three times a day (TID) | INTRAMUSCULAR | Status: DC
Start: 2013-08-05 — End: 2013-08-05
  Administered 2013-08-05: 5000 [IU] via SUBCUTANEOUS
  Filled 2013-08-05 (×4): qty 1

## 2013-08-05 MED ORDER — SODIUM CHLORIDE 0.9 % IV SOLN
INTRAVENOUS | Status: DC
  Administered 2013-08-05: 06:00:00 via INTRAVENOUS

## 2013-08-05 MED ORDER — ACETAMINOPHEN 650 MG RE SUPP
650.0000 mg | Freq: Four times a day (QID) | RECTAL | Status: DC | PRN
  Administered 2013-08-05: 650 mg via RECTAL
  Filled 2013-08-05: qty 1

## 2013-08-05 MED ORDER — FENTANYL CITRATE 0.05 MG/ML IJ SOLN
50.0000 ug | INTRAMUSCULAR | Status: DC | PRN
  Administered 2013-08-05: 50 ug via INTRAVENOUS

## 2013-08-05 MED ORDER — CLOPIDOGREL BISULFATE 75 MG PO TABS
75.0000 mg | ORAL_TABLET | Freq: Every day | ORAL | Status: DC
  Administered 2013-08-05 – 2013-08-11 (×7): 75 mg via ORAL
  Filled 2013-08-05 (×9): qty 1

## 2013-08-05 MED ORDER — PHENYLEPHRINE HCL 10 MG/ML IJ SOLN
30.0000 ug/min | INTRAMUSCULAR | Status: DC
  Administered 2013-08-05: 30 ug/min via INTRAVENOUS
  Filled 2013-08-05: qty 1

## 2013-08-05 MED ORDER — SODIUM CHLORIDE 0.9 % IV SOLN: 250.0000 mL | INTRAVENOUS | Status: DC | PRN

## 2013-08-05 MED ORDER — SODIUM CHLORIDE 0.9 % IV BOLUS (SEPSIS)
500.0000 mL | Freq: Once | INTRAVENOUS | Status: AC
  Administered 2013-08-05: 500 mL via INTRAVENOUS

## 2013-08-05 MED ORDER — ENOXAPARIN SODIUM 40 MG/0.4ML ~~LOC~~ SOLN
40.0000 mg | SUBCUTANEOUS | Status: DC
  Administered 2013-08-05 – 2013-08-11 (×7): 40 mg via SUBCUTANEOUS
  Filled 2013-08-05 (×8): qty 0.4

## 2013-08-05 MED ORDER — FENTANYL CITRATE 0.05 MG/ML IJ SOLN
INTRAMUSCULAR | Status: AC
  Filled 2013-08-05: qty 2

## 2013-08-05 MED ORDER — BACLOFEN 5 MG HALF TABLET
5.0000 mg | ORAL_TABLET | Freq: Every day | ORAL | Status: DC
  Administered 2013-08-05 – 2013-08-11 (×7): 5 mg via ORAL
  Filled 2013-08-05 (×8): qty 1

## 2013-08-05 MED ORDER — BIOTENE DRY MOUTH MT LIQD
15.0000 mL | Freq: Two times a day (BID) | OROMUCOSAL | Status: DC
  Administered 2013-08-05 – 2013-08-12 (×12): 15 mL via OROMUCOSAL

## 2013-08-05 MED ORDER — CARBIDOPA-LEVODOPA 25-100 MG PO TABS
1.0000 | ORAL_TABLET | Freq: Three times a day (TID) | ORAL | Status: DC
  Administered 2013-08-05 – 2013-08-11 (×20): 1 via ORAL
  Filled 2013-08-05 (×31): qty 1

## 2013-08-05 MED ORDER — PANTOPRAZOLE SODIUM 40 MG PO TBEC
40.0000 mg | DELAYED_RELEASE_TABLET | Freq: Every day | ORAL | Status: DC
  Administered 2013-08-05 – 2013-08-11 (×6): 40 mg via ORAL
  Filled 2013-08-05 (×7): qty 1

## 2013-08-05 MED ORDER — ASPIRIN 81 MG PO CHEW
81.0000 mg | CHEWABLE_TABLET | ORAL | Status: DC
  Administered 2013-08-05 – 2013-08-11 (×4): 81 mg via ORAL
  Filled 2013-08-05 (×4): qty 1

## 2013-08-05 MED ORDER — OXYBUTYNIN CHLORIDE 5 MG PO TABS
5.0000 mg | ORAL_TABLET | Freq: Two times a day (BID) | ORAL | Status: DC
  Administered 2013-08-05 – 2013-08-11 (×13): 5 mg via ORAL
  Filled 2013-08-05 (×20): qty 1

## 2013-08-05 MED ORDER — PIPERACILLIN-TAZOBACTAM 3.375 G IVPB
3.3750 g | Freq: Three times a day (TID) | INTRAVENOUS | Status: DC
  Administered 2013-08-05 – 2013-08-07 (×7): 3.375 g via INTRAVENOUS
  Filled 2013-08-05 (×9): qty 50

## 2013-08-05 NOTE — ED Notes (Signed)
Levo stopped due to BP increasing to 200/100.

## 2013-08-05 NOTE — ED Provider Notes (Signed)
I saw and evaluated the patient, reviewed the resident's note and I agree with the findings and plan.   EKG Interpretation   Date/Time:  Friday August 04 2013 21:49:18 EDT Ventricular Rate:  116 PR Interval:  143 QRS Duration: 111 QT Interval:  315 QTC Calculation: 437 R Axis:   97 Text Interpretation:  Sinus tachycardia Right axis deviation Low voltage,  precordial leads Borderline T abnormalities, anterior leads Confirmed by  Alvino Chapel  MD, Ovid Curd [phone]) on 08/05/2013 2:55:22 PM     Patient is febrile and hypotensive. Septic shock from UTI. Patient's had 3 L of fluid bolus with minimal improvement in blood pressure. Patient was initially DO NOT RESUSCITATE, but then patient's family decided more aggressive treatment. No CPR, but patient was willing for central lines and pressors. Admit to the ICU.  CRITICAL CARE Performed by: Mackie Pai Total critical care time: 30 Critical care time was exclusive of separately billable procedures and treating other patients. Critical care was necessary to treat or prevent imminent or life-threatening deterioration. Critical care was time spent personally by me on the following activities: development of treatment plan with patient and/or surrogate as well as nursing, discussions with consultants, evaluation of patient's response to treatment, examination of patient, obtaining history from patient or surrogate, ordering and performing treatments and interventions, ordering and review of laboratory studies, ordering and review of radiographic studies, pulse oximetry and re-evaluation of patient's condition.  Jasper Riling. Alvino Chapel, MD 08/05/13 1457

## 2013-08-05 NOTE — ED Notes (Signed)
5th liter of NS started due to BP 68/42.

## 2013-08-05 NOTE — Procedures (Signed)
Central Venous Catheter Insertion Procedure Note JAMILET AMBROISE [id number] [birth year]  Procedure: Insertion of Central Venous Catheter Indications: Drug and/or fluid administration  Procedure Details Consent: Risks of procedure as well as the alternatives and risks of each were explained to the (patient/caregiver).  Consent for procedure obtained. Time Out: Verified patient identification, verified procedure, site/side was marked, verified correct patient position, special equipment/implants available, medications/allergies/relevent history reviewed, required imaging and test results available.  Performed  Maximum sterile technique was used including antiseptics, cap, gloves, gown, hand hygiene, mask and sheet. Skin prep: Chlorhexidine; local anesthetic administered Ultrasound used to visualize left IJ and blood flow obtained but unable to thread wire. L subclavian also attempted but with poor blood flow. Unsuccessful central line placement. Patient tolerated procedure well. CXR obtained post-procedure and unchanged from prior.   Lajune Perine S 08/05/2013, 1:27 AM

## 2013-08-05 NOTE — ED Notes (Signed)
Dopamine stopped due to HR 121.

## 2013-08-05 NOTE — Progress Notes (Signed)
CRITICAL VALUE ALERT  Critical value received: Anaerobic bottle with Gram Positive Rods   Date of notification:  08/05/13  Time of notification:  2130  Critical value read back:Yes.    Nurse who received alert:  Delphia Grates RN  MD notified (1st page):  Dr Tamala Julian  Time of first page:  2132  MD notified (2nd page):  Time of second page:  Responding MD:  Dr Tamala Julian  Time MD responded:  2132

## 2013-08-05 NOTE — Progress Notes (Signed)
ANTIBIOTIC CONSULT NOTE - INITIAL  Pharmacy Consult for Vancomycin/Zosyn  Indication: rule out sepsis  Allergies  Allergen Reactions  . Latex Other (See Comments)    unknown   Patient Measurements: ~80 kg, estimated   Vital Signs: Temp: 96.3 F (35.7 C) (07/10 2341) Temp src: Rectal (07/10 2200) BP: 84/47 mmHg (07/11 0105) Pulse Rate: 88 (07/11 0105)  Labs:  Recent Labs  08/11/2013 2209  WBC 36.8*  HGB 13.7  PLT 324  CREATININE 1.43*   Medical History: Past Medical History  Diagnosis Date  . Hyperlipidemia   . Hypertension   . Asthma   . Hypokalemia   . Osteoporosis   . Osteoarthritis   . 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 Leonie Man  . Parkinsonian syndrome     saw Cecille Rubin NP   . Progressive supranuclear palsy     sees Dr. Gilford Rile at [facility]   . Neurogenic bladder     sees Dr. Diona Fanti   . Frequent UTI     sees Dr. Diona Fanti  . Neuromuscular disorder   . Bilateral hydronephrosis     sees Dr. Diona Fanti   . Esophageal stricture   . Colon polyps     adenomatous  . Internal hemorrhoids   . GERD (gastroesophageal reflux disease)     Assessment: 73 y/o F here with altered LOC, hypotension. WBC markedly elevated at 36.8, slight bump in Scr to 1.43, lactic acid 4.37, other labs as above.   Goal of Therapy:  Vancomycin trough level 15-20 mcg/ml  Plan:  -Vancomycin 1500 mg IV load completed, continue with 1000 mg IV q24h -Zosyn 3.375G IV q8h to be infused over 4 hours -Trend WBC, temp, renal function  -Drug levels as indicated   Narda Bonds 08/05/2013,1:35 AM

## 2013-08-05 NOTE — Significant Event (Signed)
I saw patient, updated husband and made small adjustments to care plan. No charge submitted for this encounter  Merton Border, MD ; [facility] service Mobile [phone].  After 5:30 PM or weekends, call [phone]

## 2013-08-05 NOTE — Procedures (Signed)
Arterial Catheter Insertion Procedure Note Katherine Ware [id number] [birth year]  Procedure: Insertion of Arterial Catheter  Indications: Blood pressure monitoring  Procedure Details Consent: Risks of procedure as well as the alternatives and risks of each were explained to the (patient/caregiver).  Consent for procedure obtained. Time Out: Verified patient identification, verified procedure, site/side was marked, verified correct patient position, special equipment/implants available, medications/allergies/relevent history reviewed, required imaging and test results available.  Performed  Maximum sterile technique was used including antiseptics, cap, gloves, gown, hand hygiene, mask and sheet. Skin prep: Chlorhexidine; local anesthetic administered 20 gauge catheter was inserted into right radial artery using the Seldinger technique.  Evaluation Blood flow good; BP tracing good. Complications: No apparent complications.   Virgilio Frees 08/05/2013

## 2013-08-05 NOTE — ED Provider Notes (Signed)
CSN: [id number]     Arrival date & time 08/05/2013  2140 History   First MD Initiated Contact with Patient 08/03/2013 2209     Chief Complaint  Patient presents with  . Hypotension     (Consider location/radiation/quality/duration/timing/severity/associated sxs/prior Treatment) Patient is a 73 y.o. female presenting with general illness.  Illness Location:  Generalized Quality:  Weakness Severity:  Severe Onset quality:  Gradual Progression:  Worsening Chronicity:  Chronic Context:  Recent UTI Relieved by:  None Worsened by:  None Associated symptoms: fever   Associated symptoms: no abdominal pain, no chest pain, no cough, no diarrhea, no headaches, no nausea, no rash, no shortness of breath, no sore throat and no vomiting     Past Medical History  Diagnosis Date  . Hyperlipidemia   . Hypertension   . Asthma   . Hypokalemia   . Osteoporosis   . Osteoarthritis   . 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 Leonie Man  . Parkinsonian syndrome     saw Cecille Rubin NP   . Progressive supranuclear palsy     sees Dr. Gilford Rile at [facility]   . Neurogenic bladder     sees Dr. Diona Fanti   . Frequent UTI     sees Dr. Diona Fanti  . Neuromuscular disorder   . Bilateral hydronephrosis     sees Dr. Diona Fanti   . Esophageal stricture   . Colon polyps     adenomatous  . Internal hemorrhoids   . GERD (gastroesophageal reflux disease)    Past Surgical History  Procedure Laterality Date  . Cholecystectomy    . Breast surgery      benign left breast cyst  . Knee surgery      left knee  . Carpal tunnel release      bilateral  . Epidural steroid      to lumbar spine 8-11 and 9-11  . Esophageal dilation      per Dr. Scarlette Shorts   . Colonscopy  2010    per Dr. Henrene Pastor, benign polyps, repeat 5 years   . Esophageal dilation      dr  Henrene Pastor   . Tubal ligation    . Benign  right breat     Family History  Problem Relation Age of Onset  . Cancer Mother   . Diabetes Mother   .  Arthritis Father   . Heart disease Father   . Hypertension Father   . Stroke Father   . Diabetes Sister   . Diabetes Maternal Grandmother    History  Substance Use Topics  . Smoking status: Never Smoker   . Smokeless tobacco: Never Used  . Alcohol Use: No   OB History   Grav Para Term Preterm Abortions TAB SAB Ect Mult Living                 Review of Systems  Constitutional: Positive for fever. Negative for chills.  HENT: Negative for sore throat.   Eyes: Negative for pain.  Respiratory: Negative for cough and shortness of breath.   Cardiovascular: Negative for chest pain.  Gastrointestinal: Negative for nausea, vomiting, abdominal pain and diarrhea.  Genitourinary: Negative for dysuria.  Musculoskeletal: Negative for back pain.  Skin: Negative for rash.  Neurological: Negative for numbness and headaches.      Allergies  Latex  Home Medications   Prior to Admission medications   Medication Sig Start Date End Date Taking? Authorizing Provider  acetaminophen (TYLENOL) 325 MG tablet  Take 325 mg by mouth every 6 (six) hours as needed for mild pain.   Yes Historical Provider, MD  aspirin 81 MG tablet Take 81 mg by mouth every other day.    Yes Historical Provider, MD  atorvastatin (LIPITOR) 20 MG tablet Take 20 mg by mouth daily.   Yes Historical Provider, MD  baclofen (LIORESAL) 10 MG tablet Take 5 mg by mouth daily.  09/22/12  Yes Laurey Morale, MD  carbidopa-levodopa (SINEMET IR) 25-100 MG per tablet Take 1 tablet by mouth 3 (three) times daily.  11/16/11  Yes Laurey Morale, MD  Cholecalciferol (VITAMIN D) 2000 UNITS CAPS Take 2,000 Units by mouth daily.    Yes Historical Provider, MD  clopidogrel (PLAVIX) 75 MG tablet Take 1 tablet (75 mg total) by mouth daily. 12/20/12  Yes Laurey Morale, MD  docusate sodium (COLACE) 100 MG capsule Take 100 mg by mouth daily.   Yes Historical Provider, MD  HYDROcodone-acetaminophen (NORCO/VICODIN) 5-325 MG per tablet Take 1 tablet by  mouth at bedtime.   Yes Historical Provider, MD  metoprolol succinate (TOPROL-XL) 25 MG 24 hr tablet Take 25 mg by mouth daily.   Yes Historical Provider, MD  omeprazole (PRILOSEC) 20 MG capsule Take 20 mg by mouth daily. Over the counter   Yes Historical Provider, MD  oxybutynin (DITROPAN) 5 MG tablet Take 5 mg by mouth 2 (two) times daily.   Yes Historical Provider, MD  venlafaxine (EFFEXOR) 37.5 MG tablet Take 37.5 mg by mouth 2 (two) times daily.   Yes Historical Provider, MD   BP 99/50  Pulse 99  Temp(Src) 99.3 F (37.4 C) (Core (Comment))  Resp 17  Ht 5\' 1"  (1.549 m)  Wt 186 lb 4.6 oz (84.5 kg)  BMI 35.22 kg/m2  SpO2 100% Physical Exam  Constitutional: She is oriented to person, place, and time. She appears well-developed and well-nourished. She appears lethargic. She has a sickly appearance.  HENT:  Head: Normocephalic and atraumatic.  Eyes: Pupils are equal, round, and reactive to light. Right eye exhibits no discharge. Left eye exhibits no discharge.  Neck: Normal range of motion.  Cardiovascular: Regular rhythm and normal heart sounds.  Tachycardia present.   Pulmonary/Chest: Effort normal and breath sounds normal.  Abdominal: Soft. She exhibits no distension. There is no tenderness.  Musculoskeletal: Normal range of motion.  Neurological: She is oriented to person, place, and time. She appears lethargic. She exhibits abnormal muscle tone.  Skin: Skin is warm. She is not diaphoretic.    ED Course  Procedures (including critical care time) Labs Review Labs Reviewed  CBC WITH DIFFERENTIAL - Abnormal; Notable for the following:    WBC 36.8 (*)    Neutrophils Relative % 91 (*)    Lymphocytes Relative 4 (*)    Neutro Abs 33.5 (*)    Monocytes Absolute 1.8 (*)    All other components within normal limits  COMPREHENSIVE METABOLIC PANEL - Abnormal; Notable for the following:    CO2 18 (*)    Glucose, Bld 147 (*)    Creatinine, Ser 1.43 (*)    Calcium 7.7 (*)    Total  Protein 5.4 (*)    Albumin 2.0 (*)    AST 116 (*)    Alkaline Phosphatase 126 (*)    GFR calc non Af Amer 36 (*)    GFR calc Af Amer 42 (*)    Anion gap 18 (*)    All other components within normal limits  URINALYSIS, ROUTINE  W REFLEX MICROSCOPIC - Abnormal; Notable for the following:    Color, Urine RED (*)    APPearance TURBID (*)    Hgb urine dipstick LARGE (*)    Ketones, ur 15 (*)    Protein, ur >300 (*)    Leukocytes, UA LARGE (*)    All other components within normal limits  URINE MICROSCOPIC-ADD ON - Abnormal; Notable for the following:    Squamous Epithelial / LPF FEW (*)    Bacteria, UA MANY (*)    All other components within normal limits  CBC - Abnormal; Notable for the following:    WBC 42.5 (*)    Hemoglobin 11.7 (*)    All other components within normal limits  BASIC METABOLIC PANEL - Abnormal; Notable for the following:    CO2 15 (*)    Glucose, Bld 161 (*)    Creatinine, Ser 1.34 (*)    Calcium 6.9 (*)    GFR calc non Af Amer 39 (*)    GFR calc Af Amer 45 (*)    Anion gap 17 (*)    All other components within normal limits  MAGNESIUM - Abnormal; Notable for the following:    Magnesium 1.3 (*)    All other components within normal limits  LACTIC ACID, PLASMA - Abnormal; Notable for the following:    Lactic Acid, Venous 3.2 (*)    All other components within normal limits  GLUCOSE, CAPILLARY - Abnormal; Notable for the following:    Glucose-Capillary 163 (*)    All other components within normal limits  GLUCOSE, CAPILLARY - Abnormal; Notable for the following:    Glucose-Capillary 132 (*)    All other components within normal limits  GLUCOSE, CAPILLARY - Abnormal; Notable for the following:    Glucose-Capillary 117 (*)    All other components within normal limits  GLUCOSE, CAPILLARY - Abnormal; Notable for the following:    Glucose-Capillary 135 (*)    All other components within normal limits  I-STAT CG4 LACTIC ACID, ED - Abnormal; Notable for the  following:    Lactic Acid, Venous 4.37 (*)    All other components within normal limits  I-STAT ARTERIAL BLOOD GAS, ED - Abnormal; Notable for the following:    pCO2 arterial 31.2 (*)    Bicarbonate 16.9 (*)    Acid-base deficit 7.0 (*)    All other components within normal limits  MRSA PCR SCREENING  CULTURE, BLOOD (ROUTINE X 2)  CULTURE, BLOOD (ROUTINE X 2)  URINE CULTURE  PROCALCITONIN  PHOSPHORUS    Imaging Review Dg Chest Portable 1 View  08/05/2013   CLINICAL DATA:  Hypotension.  EXAM: PORTABLE CHEST - 1 VIEW  COMPARISON:  08/03/2013  FINDINGS: Shallow inspiration. Cardiac enlargement without vascular congestion. Mild diffuse interstitial prominence again demonstrated. No evidence of any increase consolidation or infiltration since prior study. No blunting of costophrenic angles. No pneumothorax.  IMPRESSION: Shallow inspiration. Cardiac enlargement with interstitial changes. No progression since prior study.   Electronically Signed   By: Lucienne Capers M.D.   On: 08/05/2013 01:16   Dg Chest Port 1 View  07/29/2013   CLINICAL DATA:  Hypotension.  EXAM: PORTABLE CHEST - 1 VIEW  COMPARISON:  Chest radiograph January 13, 2012  FINDINGS: The cardiac silhouette appears similarly enlarged, mediastinal silhouette is nonsuspicious, patient is rotated to the right. Mild diffuse interstitial prominence, somewhat confluent in the left lung base. Low inspiratory examination. No pleural effusions. No pneumothorax.  Osteopenia. Mild degenerative change of  the thoracic spine. Multiple EKG lines overlie the patient and may obscure subtle underlying pathology.  IMPRESSION: Similar cardiomegaly, and diffuse mild interstitial prominence could reflect pulmonary edema/ atypical infection somewhat confluent left lung base.   Electronically Signed   By: Elon Alas   On: 07/28/2013 23:13     EKG Interpretation None      MDM   Final diagnoses:  Septic shock   Katherine Ware is a 73 y.o. female  who presents to the ED with reports of hypotension from EMS, concerns for decreased activity and worsening fatigue per the family, concerning for sepsis 2/2 possible urinary source.   Multiple labs drawn on the patient. Patient with vital sign abnormalities including tachycardia, hypoxia and lab abnormalities, c/w sepsis. Antibiotics started on the patient. Fluid resuscitation started on the patient. Will admit to intensivist for continued monitoring and workup.   Patient with grossly abnormal urine and high leukocytosis. Treated with IV Zosyn / IV Vanc. Consulted with intensivist who agree to admit the patient. BPs soft, even after 2L NS bolus. Considered pressors, and discussed code status with the family. Patient is DNR, but family wishes to pursue pressors via CVC. Attempt by critical care fellow at bedside unsuccessful. Will continue hydration and possible CVC placement in ICU. Admitted to ICU in critical condition. Patient seen and evaluated by myself and my attending, Dr. Alvino Chapel.       Freddi Che, MD 08/05/13 1224

## 2013-08-05 NOTE — ED Notes (Signed)
Critical care doctor unable to obtain central line access.  Will reattempt after additional fluid resuscitation.  RT to place A-line.  Repeat chest xray ordered.

## 2013-08-05 NOTE — H&P (Addendum)
PULMONARY  / CRITICAL CARE MEDICINE  Name: Katherine Ware MRN: [id number] DOB: Oct 08, 1940    ADMISSION DATE:  08/16/2013  REFERRING MD :  ED: Freddi Che, MD  CHIEF COMPLAINT:  Encephalopathy and hypotension  BRIEF PATIENT DESCRIPTION:  73 yo female with progressive supranuclear palsy and h/o frequent UTI and bilateral hydronephrosis presenting with septic shock, likely urinary origin.  SIGNIFICANT EVENTS / STUDIES:    LINES / TUBES: 08/07/2013 Foley catheter >>> 08/05/2013 R radial arterial line >>>  CULTURES: 07/28/2013 Blood cultures x 2 >>> 07/26/2013 Urine culture >>>  ANTIBIOTICS: Vancomycin 07/28/2013 >>> Zosyn 08/19/2013 >>>  HISTORY OF PRESENT ILLNESS:   73 yo female with progressive supranuclear palsy, neurogenic bladder with frequent UTI presenting with encephalopathy. Her family reports that at baseline she is dependent for all her ADLs, but mentally intact and conversant. They report she was in her usual state of health until this afternoon when they noticed that she did not want to open her mouth to eat when they were feeding her. She also became more lethargic throughout the day, eventually prompting them to bring her to the ED. In the ED, she was initially normotensive, but subsequently blood pressure dropped into SBP 60s-80s despite 3L IVF. They deny any recent fever, sick contacts or complaints of pain. They otherwise report that she had all her teeth removed 2 weeks ago but did not have difficulties after the procedure.  PAST MEDICAL HISTORY :  Past Medical History  Diagnosis Date  . Hyperlipidemia   . Hypertension   . Asthma   . Hypokalemia   . Osteoporosis   . Osteoarthritis   . 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 Leonie Man  . Parkinsonian syndrome     saw Cecille Rubin NP   . Progressive supranuclear palsy     sees Dr. Gilford Rile at [facility]   . Neurogenic bladder     sees Dr. Diona Fanti   . Frequent UTI     sees Dr. Diona Fanti  . Neuromuscular  disorder   . Bilateral hydronephrosis     sees Dr. Diona Fanti   . Esophageal stricture   . Colon polyps     adenomatous  . Internal hemorrhoids   . GERD (gastroesophageal reflux disease)    Past Surgical History  Procedure Laterality Date  . Cholecystectomy    . Breast surgery      benign left breast cyst  . Knee surgery      left knee  . Carpal tunnel release      bilateral  . Epidural steroid      to lumbar spine 8-11 and 9-11  . Esophageal dilation      per Dr. Scarlette Shorts   . Colonscopy  2010    per Dr. Henrene Pastor, benign polyps, repeat 5 years   . Esophageal dilation      dr  Henrene Pastor   . Tubal ligation    . Benign  right breat     Prior to Admission medications   Medication Sig Start Date End Date Taking? Authorizing Provider  acetaminophen (TYLENOL) 325 MG tablet Take 325 mg by mouth every 6 (six) hours as needed for mild pain.   Yes Historical Provider, MD  aspirin 81 MG tablet Take 81 mg by mouth every other day.    Yes Historical Provider, MD  atorvastatin (LIPITOR) 20 MG tablet Take 20 mg by mouth daily.   Yes Historical Provider, MD  baclofen (LIORESAL) 10 MG tablet Take 5 mg  by mouth daily.  09/22/12  Yes Laurey Morale, MD  carbidopa-levodopa (SINEMET IR) 25-100 MG per tablet Take 1 tablet by mouth 3 (three) times daily.  11/16/11  Yes Laurey Morale, MD  Cholecalciferol (VITAMIN D) 2000 UNITS CAPS Take 2,000 Units by mouth daily.    Yes Historical Provider, MD  clopidogrel (PLAVIX) 75 MG tablet Take 1 tablet (75 mg total) by mouth daily. 12/20/12  Yes Laurey Morale, MD  docusate sodium (COLACE) 100 MG capsule Take 100 mg by mouth daily.   Yes Historical Provider, MD  HYDROcodone-acetaminophen (NORCO/VICODIN) 5-325 MG per tablet Take 1 tablet by mouth at bedtime.   Yes Historical Provider, MD  metoprolol succinate (TOPROL-XL) 25 MG 24 hr tablet Take 25 mg by mouth daily.   Yes Historical Provider, MD  omeprazole (PRILOSEC) 20 MG capsule Take 20 mg by mouth daily. Over the  counter   Yes Historical Provider, MD  oxybutynin (DITROPAN) 5 MG tablet Take 5 mg by mouth 2 (two) times daily.   Yes Historical Provider, MD  venlafaxine (EFFEXOR) 37.5 MG tablet Take 37.5 mg by mouth 2 (two) times daily.   Yes Historical Provider, MD   Allergies  Allergen Reactions  . Latex Other (See Comments)    unknown    FAMILY HISTORY:  Family History  Problem Relation Age of Onset  . Cancer Mother   . Diabetes Mother   . Arthritis Father   . Heart disease Father   . Hypertension Father   . Stroke Father   . Diabetes Sister   . Diabetes Maternal Grandmother    SOCIAL HISTORY:  reports that she has never smoked. She has never used smokeless tobacco. She reports that she does not drink alcohol or use illicit drugs.  REVIEW OF SYSTEMS:  Unable to obtain secondary to patient encephalopathy   VITAL SIGNS: Temp:  [96.3 F (35.7 C)-101.4 F (38.6 C)] 96.3 F (35.7 C) (07/10 2341) Pulse Rate:  [85-113] 88 (07/11 0105) Resp:  [14-35] 14 (07/11 0105) BP: (69-130)/(31-98) 84/47 mmHg (07/11 0105) SpO2:  [85 %-100 %] 92 % (07/11 0105) HEMODYNAMICS:   VENTILATOR SETTINGS:   INTAKE / OUTPUT: Intake/Output   None     PHYSICAL EXAMINATION: General:  Somnolent, no acute distress Neuro:  Somnolent, briefly opens eyes to voice and sternal rub, not answering questions or following commands. R neck contracture and stiff extremities throughout. HEENT:  AT, [place], PERRL, EOMI, dry mm, edentulous Cardiovascular:  RRR, no murmurs/rubs/gallops, +2 distal pulses, +1 BLE edema  Lungs:  Clear to auscultation bilaterally, no wheezes/rales/rhonchi  Abdomen:  +BS, soft, nontender, nondistended Musculoskeletal:  No clubbing or cyanosis Skin:  No rash  LABS:  CBC Recent Labs     08/01/2013  2209  WBC  36.8*  HGB  13.7  HCT  42.2  PLT  324   Coag's No results found for this basename: APTT, INR,  in the last 72 hours BMET Recent Labs     08/18/2013  2209  NA  137  K  4.2  CL   101  CO2  18*  BUN  13  CREATININE  1.43*  GLUCOSE  147*   Electrolytes Recent Labs     08/09/2013  2209  CALCIUM  7.7*   Sepsis Markers Recent Labs     08/03/2013  2209  PROCALCITON  6.65   ABG Recent Labs     08/03/2013  2222  PHART  7.350  PCO2ART  31.2*  PO2ART  87.0  Liver Enzymes Recent Labs     07/31/2013  2209  AST  116*  ALT  12  ALKPHOS  126*  BILITOT  0.5  ALBUMIN  2.0*   Cardiac Enzymes No results found for this basename: TROPONINI, PROBNP,  in the last 72 hours Glucose No results found for this basename: GLUCAP,  in the last 72 hours  Imaging Dg Chest Portable 1 View  08/05/2013   CLINICAL DATA:  Hypotension.  EXAM: PORTABLE CHEST - 1 VIEW  COMPARISON:  08/25/2013  FINDINGS: Shallow inspiration. Cardiac enlargement without vascular congestion. Mild diffuse interstitial prominence again demonstrated. No evidence of any increase consolidation or infiltration since prior study. No blunting of costophrenic angles. No pneumothorax.  IMPRESSION: Shallow inspiration. Cardiac enlargement with interstitial changes. No progression since prior study.   Electronically Signed   By: Lucienne Capers M.D.   On: 08/05/2013 01:16   Dg Chest Port 1 View  08/21/2013   CLINICAL DATA:  Hypotension.  EXAM: PORTABLE CHEST - 1 VIEW  COMPARISON:  Chest radiograph January 13, 2012  FINDINGS: The cardiac silhouette appears similarly enlarged, mediastinal silhouette is nonsuspicious, patient is rotated to the right. Mild diffuse interstitial prominence, somewhat confluent in the left lung base. Low inspiratory examination. No pleural effusions. No pneumothorax.  Osteopenia. Mild degenerative change of the thoracic spine. Multiple EKG lines overlie the patient and may obscure subtle underlying pathology.  IMPRESSION: Similar cardiomegaly, and diffuse mild interstitial prominence could reflect pulmonary edema/ atypical infection somewhat confluent left lung base.   Electronically Signed   By:  Elon Alas   On: 07/30/2013 23:13    ASSESSMENT / PLAN:  PULMONARY A: Acute hypoxic respiratory failure, unclear etiology ?related to encephalopathy, mild pulmonary edema, infection P:   -- Continue supplemental oxygen as needed to maintain SpO2 >/= 92%, currently on 5L [place] -- Patient is DNI  CARDIOVASCULAR A: Septic shock P:  -- S/p 4L IVF, further boluses or start pressors as needed -- did not tolerate dopamine due to tachycardia -- CVL attempted in ED but unable to obtain 2/2 patient very intravascularly volume depleted, may need to reassess after further fluid resuscitation if persistent hypotension requiring pressors  RENAL A:  Acute renal failure, prerenal vs. infection      H/o neurogenic bladder and bilateral hydronephrosis      Metabolic acidosis P:   -- continue IVF resuscitation -- antibiotics as below -- continue to monitor closely and consider repeat Renal US if not improving -- Foley catheter now in place (typically I/O cath BID at home) -- renally dose medications and avoid nephrotoxins -- continue oxybutynin for neurogenic bladder  GASTROINTESTINAL A:  Mild transaminitis, likely sepsis      GERD      Constipation  P:   -- Continue to monitor closely -- Continue home bowel regimen -- Continue PPI  HEMATOLOGIC A:  DVT prophylaxis      Leukocytosis P:  -- heparin SQ given acute renal failure -- leukocytosis likely related to infection and volume depletion, continue to monitor  INFECTIOUS A:  Septic shock, likely urinary source P:   -- Blood cultures, urine culture pending -- Continue vancomycin and zosyn and tailor antibiotics as able -- Continue to monitor lactic acid and pro-calcitonin  ENDOCRINE A:  No acute issues, continue to monitor   P:    NEUROLOGIC A:  Encephalopathy, likely related to sepsis      Progressive supranuclear palsy and parkinsonian syndrome      H/o lacunar strokes  P:   -- Continue to monitor and avoid sedating  medications as able, holding home pain meds for now -- Continue home medications as able - Sinemet, baclofen, effexor -- Continue home medications for h/o stroke with plavix and ASA  Code status: DNI, no chest compressions or shocks, medications acceptable  TODAY'S SUMMARY: Family at bedside and updated.  I have personally obtained a history, examined the patient, evaluated laboratory and imaging results, formulated the assessment and plan and placed orders.  CRITICAL CARE: The patient is critically ill with multiple organ systems failure and requires high complexity decision making for assessment and support, frequent evaluation and titration of therapies, application of advanced monitoring technologies and extensive interpretation of multiple databases. Critical Care Time devoted to patient care services described in this note is 70 minutes.   Maricela Curet, MD, PhD Pulmonary and [place] Pager: [phone]  08/05/2013, 1:35 AM

## 2013-08-06 ENCOUNTER — Inpatient Hospital Stay (HOSPITAL_COMMUNITY): Payer: Medicare Other

## 2013-08-06 DIAGNOSIS — R6521 Severe sepsis with septic shock: Secondary | ICD-10-CM

## 2013-08-06 DIAGNOSIS — A419 Sepsis, unspecified organism: Principal | ICD-10-CM

## 2013-08-06 DIAGNOSIS — N390 Urinary tract infection, site not specified: Secondary | ICD-10-CM

## 2013-08-06 DIAGNOSIS — R6520 Severe sepsis without septic shock: Secondary | ICD-10-CM

## 2013-08-06 LAB — CBC
HEMATOCRIT: 30.3 % — AB (ref 36.0–46.0)
Hemoglobin: 10 g/dL — ABNORMAL LOW (ref 12.0–15.0)
MCH: 30.8 pg (ref 26.0–34.0)
MCHC: 33 g/dL (ref 30.0–36.0)
MCV: 93.2 fL (ref 78.0–100.0)
Platelets: 211 10*3/uL (ref 150–400)
RBC: 3.25 MIL/uL — ABNORMAL LOW (ref 3.87–5.11)
RDW: 14 % (ref 11.5–15.5)
WBC: 31.3 10*3/uL — ABNORMAL HIGH (ref 4.0–10.5)

## 2013-08-06 LAB — BLOOD GAS, ARTERIAL
Acid-base deficit: 4.5 mmol/L — ABNORMAL HIGH (ref 0.0–2.0)
BICARBONATE: 19.6 meq/L — AB (ref 20.0–24.0)
Drawn by: 362771
O2 Content: 2 L/min
O2 SAT: 95.8 %
PATIENT TEMPERATURE: 98.6
PH ART: 7.39 (ref 7.350–7.450)
TCO2: 20.6 mmol/L (ref 0–100)
pCO2 arterial: 33 mmHg — ABNORMAL LOW (ref 35.0–45.0)
pO2, Arterial: 79.4 mmHg — ABNORMAL LOW (ref 80.0–100.0)

## 2013-08-06 LAB — BASIC METABOLIC PANEL
ANION GAP: 16 — AB (ref 5–15)
BUN: 16 mg/dL (ref 6–23)
CALCIUM: 7.1 mg/dL — AB (ref 8.4–10.5)
CO2: 18 meq/L — AB (ref 19–32)
CREATININE: 1.12 mg/dL — AB (ref 0.50–1.10)
Chloride: 107 mEq/L (ref 96–112)
GFR calc Af Amer: 56 mL/min — ABNORMAL LOW (ref >90)
GFR calc non Af Amer: 48 mL/min — ABNORMAL LOW (ref >90)
Glucose, Bld: 168 mg/dL — ABNORMAL HIGH (ref 70–99)
Potassium: 3.4 mEq/L — ABNORMAL LOW (ref 3.7–5.3)
Sodium: 141 mEq/L (ref 137–147)

## 2013-08-06 LAB — CULTURE, BLOOD (ROUTINE X 2)

## 2013-08-06 LAB — GLUCOSE, CAPILLARY
GLUCOSE-CAPILLARY: 173 mg/dL — AB (ref 70–99)
Glucose-Capillary: 179 mg/dL — ABNORMAL HIGH (ref 70–99)
Glucose-Capillary: 153 mg/dL — ABNORMAL HIGH (ref 70–99)
Glucose-Capillary: 150 mg/dL — ABNORMAL HIGH (ref 70–99)

## 2013-08-06 MED ORDER — KCL IN DEXTROSE-NACL 10-5-0.45 MEQ/L-%-% IV SOLN
INTRAVENOUS | Status: DC
  Administered 2013-08-06: 21:00:00 via INTRAVENOUS
  Filled 2013-08-06: qty 1000

## 2013-08-06 MED ORDER — POTASSIUM CHLORIDE CRYS ER 20 MEQ PO TBCR
20.0000 meq | EXTENDED_RELEASE_TABLET | ORAL | Status: AC
  Administered 2013-08-06 (×2): 20 meq via ORAL
  Filled 2013-08-06 (×2): qty 1

## 2013-08-06 NOTE — Progress Notes (Signed)
[place] Progress Note Patient Name: Katherine Ware DOB: 11-27-1940 MRN: [id number]  Date of Service  08/06/2013   HPI/Events of Note  Nurse reports dyspnea and crackles on exam.  97% O2 sat on RA.   eICU Interventions  Stop D5 cxr now abg now   Intervention Category Minor Interventions: Other:  Mauri [place], Mamie Nick 08/06/2013, 9:50 PM

## 2013-08-06 NOTE — Progress Notes (Signed)
PULMONARY  / CRITICAL CARE MEDICINE  Name: Katherine Ware MRN: [id number] DOB: 1940-03-09    ADMISSION DATE:  08/15/2013  REFERRING MD :  ED: Freddi Che, MD  CHIEF COMPLAINT:  Encephalopathy and hypotension  BRIEF PATIENT DESCRIPTION:  73 yo female with progressive supranuclear palsy and h/o frequent UTI and bilateral hydronephrosis presenting with septic shock, likely urinary origin.  SIGNIFICANT EVENTS / STUDIES:  Renal US 7/12>>>  LINES / TUBES: 07/28/2013 Foley catheter >>> 08/05/2013 R radial arterial line >>>7/12  CULTURES: 08/15/2013 Blood cultures x 2 >>> 08/02/2013 Urine culture: GNR > 100K>>  ANTIBIOTICS: Vancomycin 08/01/2013 >>> Zosyn 08/17/2013 >>>  Subjective  08/06/13 - awake, family member at bedside. Not intubated. Vitals ok.   VITAL SIGNS: Temp:  [98.2 F (36.8 C)-100.6 F (38.1 C)] 98.8 F (37.1 C) (07/12 0400) Pulse Rate:  [89-119] 107 (07/12 1200) Resp:  [6-32] 22 (07/12 1200) BP: (70-128)/(46-62) 128/62 mmHg (07/12 1200) SpO2:  [96 %-100 %] 98 % (07/12 1200) Arterial Line BP: (91-154)/(37-65) 154/54 mmHg (07/12 1200) Weight:  [85.1 kg (187 lb 9.8 oz)] 85.1 kg (187 lb 9.8 oz) (07/12 0500) Room air  HEMODYNAMICS:   VENTILATOR SETTINGS:   INTAKE / OUTPUT: Intake/Output     07/11 0701 - 07/12 0700 07/12 0701 - 07/13 0700   P.O. 750 150   I.V. (mL/kg) 2045.1 (24) 375 (4.4)   IV Piggyback 350    Total Intake(mL/kg) 3145.1 (37) 525 (6.2)   Urine (mL/kg/hr) 2200 (1.1) 200 (0.3)   Stool 1 (0)    Total Output 2201 200   Net +944.1 +325        Stool Occurrence 1 x      PHYSICAL EXAMINATION: General:  Awake, oriented. No distress  Neuro awake, f/c. trys to communicate  R neck contracture and stiff extremities throughout. HEENT:  AT, [place], PERRL, EOMI, dry mm, edentulous Cardiovascular:  RRR, no murmurs/rubs/gallops, +2 distal pulses, +1 BLE edema  Lungs:  Clear to auscultation bilaterally, decreased  Abdomen:  +BS, soft, nontender,  nondistended Musculoskeletal:  No clubbing or cyanosis Skin:  No rash  LABS: PULMONARY  Recent Labs Lab 08/19/2013 2222  PHART 7.350  PCO2ART 31.2*  PO2ART 87.0  HCO3 16.9*  TCO2 18  O2SAT 95.0    CBC  Recent Labs Lab 08/17/2013 2209 08/05/13 0305 08/06/13 0440  HGB 13.7 11.7* 10.0*  HCT 42.2 36.7 30.3*  WBC 36.8* 42.5* 31.3*  PLT 324 286 211    COAGULATION No results found for this basename: INR,  in the last 168 hours  CARDIAC  No results found for this basename: TROPONINI,  in the last 168 hours No results found for this basename: PROBNP,  in the last 168 hours   CHEMISTRY  Recent Labs Lab 08/20/2013 2209 08/05/13 0305 08/06/13 0440  NA 137 142 141  K 4.2 4.4 3.4*  CL 101 110 107  CO2 18* 15* 18*  GLUCOSE 147* 161* 168*  BUN 13 14 16   CREATININE 1.43* 1.34* 1.12*  CALCIUM 7.7* 6.9* 7.1*  MG  --  1.3*  --   PHOS  --  3.9  --    Estimated Creatinine Clearance: 45.6 ml/min (by C-G formula based on Cr of 1.12).   LIVER  Recent Labs Lab 08/18/2013 2209  AST 116*  ALT 12  ALKPHOS 126*  BILITOT 0.5  PROT 5.4*  ALBUMIN 2.0*     INFECTIOUS  Recent Labs Lab 07/28/2013 2209 07/30/2013 2219 08/05/13 0305  LATICACIDVEN  --  4.37*  3.2*  PROCALCITON 6.65  --   --      ENDOCRINE CBG (last 3)   Recent Labs  08/06/13 0443 08/06/13 0733 08/06/13 1125  GLUCAP 179* 153* 150*         IMAGING x48h  Dg Chest Portable 1 View  08/05/2013   CLINICAL DATA:  Hypotension.  EXAM: PORTABLE CHEST - 1 VIEW  COMPARISON:  07/30/2013  FINDINGS: Shallow inspiration. Cardiac enlargement without vascular congestion. Mild diffuse interstitial prominence again demonstrated. No evidence of any increase consolidation or infiltration since prior study. No blunting of costophrenic angles. No pneumothorax.  IMPRESSION: Shallow inspiration. Cardiac enlargement with interstitial changes. No progression since prior study.   Electronically Signed   By: Lucienne Capers  M.D.   On: 08/05/2013 01:16   Dg Chest Port 1 View  07/27/2013   CLINICAL DATA:  Hypotension.  EXAM: PORTABLE CHEST - 1 VIEW  COMPARISON:  Chest radiograph January 13, 2012  FINDINGS: The cardiac silhouette appears similarly enlarged, mediastinal silhouette is nonsuspicious, patient is rotated to the right. Mild diffuse interstitial prominence, somewhat confluent in the left lung base. Low inspiratory examination. No pleural effusions. No pneumothorax.  Osteopenia. Mild degenerative change of the thoracic spine. Multiple EKG lines overlie the patient and may obscure subtle underlying pathology.  IMPRESSION: Similar cardiomegaly, and diffuse mild interstitial prominence could reflect pulmonary edema/ atypical infection somewhat confluent left lung base.   Electronically Signed   By: Elon Alas   On: 08/13/2013 23:13       ASSESSMENT / PLAN:  Septic shock (UT source w/ GNR growing in urine)-->resolved  -- did not tolerate dopamine due to tachycardia. off pressors as of 08/06/13 Plan:  Encourage po intake   Decrease IVFs  D/c vanc   Cont zosyn and narrow when sensitivities back   Acute renal failure, prerenal vs. Infection -->improved       H/o neurogenic bladder and bilateral hydronephrosis      Metabolic acidosis-->improved  Plan    KVO IVF  Foley cath   repeat Renal US  renally dose medications and avoid nephrotoxins  continue oxybutynin for neurogenic bladder  Encephalopathy, likely related to sepsis      Progressive supranuclear palsy and parkinsonian syndrome      H/o lacunar strokes      MS now at baseline  Plan:    Continue home medications- Sinemet, baclofen, effexor  Continue home medications for h/o stroke with plavix and ASA   Mild transaminitis, likely sepsis      GERD      Constipation  Plan:    Continue to monitor closely  Continue home bowel regimen  Continue PPI    Code status: DNI, no chest compressions or shocks, medications acceptable   Can  go to medical ward. [facility] assume rx in am      Marni Griffon NP 08/06/2013, 1:47 PM    STAFF NOTE  - improved. Physically deconditioned. DNI, DNAR but full medical care. Move to med surg under [facility] for 08/07/13 AM pick up and pccm off. Rest per NP   Dr. Brand Males, M.D., [facility].C.P Pulmonary and Critical Care Medicine Staff Physician [place] [facility] Pager: [phone], If no answer or between  15:00h - 7:00h: call [phone]  08/06/2013 2:40 PM

## 2013-08-06 NOTE — Progress Notes (Signed)
Report given to receiving RN 6North room 11

## 2013-08-07 DIAGNOSIS — B961 Klebsiella pneumoniae [K. pneumoniae] as the cause of diseases classified elsewhere: Secondary | ICD-10-CM

## 2013-08-07 DIAGNOSIS — Z1612 Extended spectrum beta lactamase (ESBL) resistance: Secondary | ICD-10-CM

## 2013-08-07 DIAGNOSIS — A498 Other bacterial infections of unspecified site: Secondary | ICD-10-CM

## 2013-08-07 DIAGNOSIS — G238 Other specified degenerative diseases of basal ganglia: Secondary | ICD-10-CM

## 2013-08-07 DIAGNOSIS — N179 Acute kidney failure, unspecified: Secondary | ICD-10-CM

## 2013-08-07 DIAGNOSIS — Z1619 Resistance to other specified beta lactam antibiotics: Secondary | ICD-10-CM

## 2013-08-07 LAB — BASIC METABOLIC PANEL
ANION GAP: 14 (ref 5–15)
BUN: 12 mg/dL (ref 6–23)
CHLORIDE: 109 meq/L (ref 96–112)
CO2: 20 mEq/L (ref 19–32)
CREATININE: 0.91 mg/dL (ref 0.50–1.10)
Calcium: 7.7 mg/dL — ABNORMAL LOW (ref 8.4–10.5)
GFR calc Af Amer: 72 mL/min — ABNORMAL LOW (ref >90)
GFR calc non Af Amer: 62 mL/min — ABNORMAL LOW (ref >90)
Glucose, Bld: 109 mg/dL — ABNORMAL HIGH (ref 70–99)
POTASSIUM: 3.5 meq/L — AB (ref 3.7–5.3)
Sodium: 143 mEq/L (ref 137–147)

## 2013-08-07 LAB — URINE CULTURE

## 2013-08-07 LAB — CBC
HCT: 30.6 % — ABNORMAL LOW (ref 36.0–46.0)
HEMOGLOBIN: 9.8 g/dL — AB (ref 12.0–15.0)
MCH: 29.8 pg (ref 26.0–34.0)
MCHC: 32 g/dL (ref 30.0–36.0)
MCV: 93 fL (ref 78.0–100.0)
Platelets: 195 10*3/uL (ref 150–400)
RBC: 3.29 MIL/uL — AB (ref 3.87–5.11)
RDW: 14.2 % (ref 11.5–15.5)
WBC: 24.9 10*3/uL — ABNORMAL HIGH (ref 4.0–10.5)

## 2013-08-07 LAB — PHOSPHORUS: Phosphorus: 2.8 mg/dL (ref 2.3–4.6)

## 2013-08-07 LAB — MAGNESIUM: MAGNESIUM: 1.5 mg/dL (ref 1.5–2.5)

## 2013-08-07 LAB — LACTIC ACID, PLASMA: LACTIC ACID, VENOUS: 1.4 mmol/L (ref 0.5–2.2)

## 2013-08-07 MED ORDER — HYDROCODONE-ACETAMINOPHEN 5-325 MG PO TABS
1.0000 | ORAL_TABLET | ORAL | Status: DC | PRN
  Administered 2013-08-10 – 2013-08-11 (×3): 1 via ORAL
  Filled 2013-08-07 (×4): qty 1

## 2013-08-07 MED ORDER — ENSURE COMPLETE PO LIQD
237.0000 mL | Freq: Two times a day (BID) | ORAL | Status: DC
  Administered 2013-08-07 – 2013-08-11 (×5): 237 mL via ORAL

## 2013-08-07 MED ORDER — WHITE PETROLATUM GEL
Status: AC
  Administered 2013-08-07: 0.2
  Filled 2013-08-07: qty 5

## 2013-08-07 MED ORDER — SODIUM CHLORIDE 0.9 % IV SOLN
500.0000 mg | Freq: Three times a day (TID) | INTRAVENOUS | Status: DC
  Administered 2013-08-07 – 2013-08-12 (×16): 500 mg via INTRAVENOUS
  Filled 2013-08-07 (×19): qty 500

## 2013-08-07 NOTE — Progress Notes (Signed)
Rapid response at bedside to re-assess pt as she was slow to respond. Upon assessment pt was found to be at baseline. Vital signs: BP:139/ 68; HR: 96; O2 Sat: 94% 5L per [place]; resp: 20.

## 2013-08-07 NOTE — Progress Notes (Signed)
ANTIBIOTIC CONSULT NOTE - INITIAL  Pharmacy Consult for Primaxin  Indication: ESBL Klebsiella Bacteremia  Allergies  Allergen Reactions  . Latex Other (See Comments)    unknown    Patient Measurements: Height: 5\' 1"  (154.9 cm) Weight: 187 lb 9.8 oz (85.1 kg) IBW/kg (Calculated) : 47.8  Vital Signs: Temp: 98.5 F (36.9 C) (07/13 0530) Temp src: Oral (07/13 0530) BP: 137/66 mmHg (07/13 0530) Pulse Rate: 94 (07/13 0530)  Labs:  Recent Labs  08/05/13 0305 08/06/13 0440 08/07/13 0525  WBC 42.5* 31.3* 24.9*  HGB 11.7* 10.0* 9.8*  PLT 286 211 195  CREATININE 1.34* 1.12* 0.91   Estimated Creatinine Clearance: 56.1 ml/min (by C-G formula based on Cr of 0.91).  Microbiology: Recent Results (from the past 720 hour(s))  CULTURE, BLOOD (ROUTINE X 2)     Status: None   Collection Time    08/09/2013 10:28 PM      Result Value Ref Range Status   Specimen Description BLOOD WRIST LEFT   Final   Special Requests BOTTLES DRAWN AEROBIC AND ANAEROBIC 10CC   Final   Culture  Setup Time     Final   Value: 08/05/2013 12:07     Performed at [facility]   Culture     Final   Value: KLEBSIELLA PNEUMONIAE     Note: Confirmed Extended Spectrum Beta-Lactamase Producer (ESBL)     Note: Gram Stain Report Called to,Read Back By and Verified With: CHRIS HAYES 08/05/13 @ 9:31PM BY RUSCOE A. PREVIOUSLY REPORTED AS GRAM POSITIVE RODS CORRECTED RESULTS CALLED TO: MELINDA MACASERO 08/06/13 @ 10AM BY RUSCOE A. CRITICAL RESULT CALLED TO,      READ BACK BY AND VERIFIED WITH: Madlyn Frankel 08/06/13 @ 8:33PM BY RUSCOE A.     Performed at [facility]   Report Status 08/06/2013 FINAL   Final   Organism ID, Bacteria KLEBSIELLA PNEUMONIAE   Final  CULTURE, BLOOD (ROUTINE X 2)     Status: None   Collection Time    08/19/2013 10:36 PM      Result Value Ref Range Status   Specimen Description BLOOD HAND RIGHT   Final   Special Requests BOTTLES DRAWN AEROBIC AND ANAEROBIC [facility]   Final   Culture   Setup Time     Final   Value: 08/05/2013 12:07     Performed at [facility]   Culture     Final   Value:        BLOOD CULTURE RECEIVED NO GROWTH TO DATE CULTURE WILL BE HELD FOR 5 DAYS BEFORE ISSUING A FINAL NEGATIVE REPORT     Performed at [facility]   Report Status PENDING   Incomplete  URINE CULTURE     Status: None   Collection Time    07/30/2013 10:48 PM      Result Value Ref Range Status   Specimen Description URINE, CATHETERIZED   Final   Special Requests NONE   Final   Culture  Setup Time     Final   Value: 08/05/2013 12:50     Performed at [place]     Final   Value: >=100,000 COLONIES/ML     Performed at [facility]   Culture     Final   Value: GRAM NEGATIVE RODS     Performed at [facility]   Report Status PENDING   Incomplete  MRSA PCR SCREENING     Status: None  Collection Time    08/05/13  2:28 AM      Result Value Ref Range Status   MRSA by PCR NEGATIVE  NEGATIVE Final   Comment:            The GeneXpert MRSA Assay (FDA     approved for NASAL specimens     only), is one component of a     comprehensive MRSA colonization     surveillance program. It is not     intended to diagnose MRSA     infection nor to guide or     monitor treatment for     MRSA infections.    Medical History: Past Medical History  Diagnosis Date  . Hyperlipidemia   . Hypertension   . Asthma   . Hypokalemia   . Osteoporosis   . Osteoarthritis   . 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 Leonie Man  . Parkinsonian syndrome     saw Cecille Rubin NP   . Progressive supranuclear palsy     sees Dr. Gilford Rile at [facility]   . Neurogenic bladder     sees Dr. Diona Fanti   . Frequent UTI     sees Dr. Diona Fanti  . Neuromuscular disorder   . Bilateral hydronephrosis     sees Dr. Diona Fanti   . Esophageal stricture   . Colon polyps     adenomatous  . Internal hemorrhoids   . GERD (gastroesophageal reflux disease)     Assessment: 73 y/o F with cultures back revealing ESBL Klebsiella bacteremia (R-Zosyn--which she is on, S-Primaxin). WBC is coming down but remains elevated at 24.9. Scr coming down. Other labs as above.   Goal of Therapy:  Clinical resolution   Plan:  -DC Zosyn  -Start Primaxin 500 mg IV q8h -Trend WBC, temp, renal function   Narda Bonds 08/07/2013,7:21 AM

## 2013-08-07 NOTE — Evaluation (Signed)
Clinical/Bedside Swallow Evaluation Patient Details  Name: Katherine Ware MRN: [id number] Date of Birth: [birth year]  Today's Date: 08/07/2013 Time: 4098-1191 SLP Time Calculation (min): 22 min  Past Medical History:  Past Medical History  Diagnosis Date  . Hyperlipidemia   . Hypertension   . Asthma   . Hypokalemia   . Osteoporosis   . Osteoarthritis   . 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 Leonie Man  . Parkinsonian syndrome     saw Cecille Rubin NP   . Progressive supranuclear palsy     sees Dr. Gilford Rile at [facility]   . Neurogenic bladder     sees Dr. Diona Fanti   . Frequent UTI     sees Dr. Diona Fanti  . Neuromuscular disorder   . Bilateral hydronephrosis     sees Dr. Diona Fanti   . Esophageal stricture   . Colon polyps     adenomatous  . Internal hemorrhoids   . GERD (gastroesophageal reflux disease)    Past Surgical History:  Past Surgical History  Procedure Laterality Date  . Cholecystectomy    . Breast surgery      benign left breast cyst  . Knee surgery      left knee  . Carpal tunnel release      bilateral  . Epidural steroid      to lumbar spine 8-11 and 9-11  . Esophageal dilation      per Dr. Scarlette Shorts   . Colonscopy  2010    per Dr. Henrene Pastor, benign polyps, repeat 5 years   . Esophageal dilation      dr  Henrene Pastor   . Tubal ligation    . Benign  right breat     HPI:  73 yo female with progressive supranuclear palsy and h/o frequent UTI and bilateral hydronephrosis presenting with septic shock, likely urinary origin. Caregivers report recent oral surgery with tooth extraction wtih tenderness and stitches remaining in mouth.   Assessment / Plan / Recommendation Clinical Impression  Pt's primary limiting factor for adequate intake appears to be her ability to orally accept and manipulate PO. Pt's limited ROM for jaw mobility makes it difficult to accept more than a small amount of puree at a time, which may result in early fatigue over the course of a  meal. Pt demonstrated a wet vocal quality x1 across challenging with thin liquids via straw, which easily returned to baseline with a cued throat clear. Recommend to initiate Dys 1 textures and thin liquids with full supervision and assistance with feeding.     Aspiration Risk  Moderate    Diet Recommendation Dysphagia 1 (Puree);Thin liquid   Liquid Administration via: Straw Medication Administration: Crushed with puree Supervision: Staff to assist with self feeding;Full supervision/cueing for compensatory strategies Compensations: Slow rate;Small sips/bites Postural Changes and/or Swallow Maneuvers: Seated upright 90 degrees;Upright 30-60 min after meal    Other  Recommendations Oral Care Recommendations: Oral care BID   Follow Up Recommendations  Skilled Nursing facility    Frequency and Duration min 2x/week  2 weeks   Pertinent Vitals/Pain n/a    SLP Swallow Goals     Swallow Study Prior Functional Status       General Date of Onset: 08/03/13 HPI: 73 yo female with progressive supranuclear palsy and h/o frequent UTI and bilateral hydronephrosis presenting with septic shock, likely urinary origin. Caregivers report recent oral surgery with tooth extraction wtih tenderness and stitches remaining in mouth. Type of Study: Bedside swallow evaluation  Previous Swallow Assessment: most recent MBS in February 2014 recommended advance to Dys 3 and thin liquids with small, single sips (penetration observed with large sips but was spontaneously cleared wtih extra reflexive swallows) Diet Prior to this Study: Thin liquids;Other (Comment) (full liquid diet) Temperature Spikes Noted: No Respiratory Status: Room air History of Recent Intubation: No Behavior/Cognition: Cooperative;Pleasant mood;Lethargic Oral Cavity - Dentition: Edentulous (caregivers report pt recently had teeth extracted) Self-Feeding Abilities: Total assist Patient Positioning: Upright in bed Baseline Vocal Quality:  Low vocal intensity Volitional Cough: Weak Volitional Swallow: Unable to elicit    Oral/Motor/Sensory Function Overall Oral Motor/Sensory Function: Impaired Labial ROM: Reduced right;Reduced left Labial Symmetry: Within Functional Limits Labial Strength: Reduced Lingual ROM: Reduced right;Reduced left Lingual Symmetry: Within Functional Limits Lingual Strength: Reduced Facial Symmetry: Within Functional Limits   Ice Chips Ice chips: Not tested   Thin Liquid Thin Liquid: Impaired Presentation: Straw Oral Phase Impairments: Reduced labial seal;Reduced lingual movement/coordination;Impaired anterior to posterior transit Oral Phase Functional Implications: Prolonged oral transit Pharyngeal  Phase Impairments: Multiple swallows    Nectar Thick Nectar Thick Liquid: Not tested   Honey Thick Honey Thick Liquid: Not tested   Puree Puree: Impaired Presentation: Spoon Oral Phase Impairments: Reduced labial seal;Reduced lingual movement/coordination;Impaired anterior to posterior transit Oral Phase Functional Implications: Prolonged oral transit   Solid   GO    Solid: Not tested        [name], M.A. CCC-SLP [phone]  [name] 08/07/2013,2:22 PM

## 2013-08-07 NOTE — Progress Notes (Signed)
PATIENT DETAILS Name: Katherine Ware Age: 73 y.o. Sex: female Date of Birth: 06-21-1940 Admit Date: 08/11/2013 Admitting Physician Colbert Coyer, MD PCP:FRY,STEPHEN A, MD  Subjective: No major complaints-sister at bedside-current mental status is baseline. Patient is bedbound, and dependent on family for all ADL's  Assessment/Plan: Active Problems:   Septic shock -secondary to ESBL bacteremia and UTI -resolved, required pressors briefly while at ICU  ESBL Klebsiella Bacteremia -previously on empiric Vanco/Zosyn, given positive blood cultures for ESBL switched to Primaxin on 7/13, will plan on a 14 day course. Repeat Blood cultures on 7/14, and then place PICC line.   UTI -urine cx prelim-gram neg rods-now on Primaxin started 7/13.  ARF -likely pre-renal, resolved -continue to monitor lytes periodically  Metabolic Acidosis -secondary to above -resolved with supportive care  Acute Encephalopathy -resolved, likely metabolic encephalopathy -per sister on 7/13-now at baseline -get SLP eval-prior to advancing diet  H/o Progressive supranuclear palsy and parkinsonian syndrome  H/o lacunar strokes -Continue home medications- Sinemet, baclofen, effexor  -Continue home medications for h/o stroke with plavix and ASA  H/o neurogenic bladder and bilateral hydronephrosis -currently with foley catheter-will remove in next day or so, and re-evaluate. Family was helping patient with In and out straight cath's prior to admit.  GERD -PPI  Disposition: Remain inpatient-suspect home with homehealth services in next 2-3 days  DVT Prophylaxis: Prophylactic Lovenox   Code Status: Partial Code-No CPR, DNI-but ok for vasopressors and anti-arrythmics  Family Communication Sister at bedside  Procedures:  None  CONSULTS:  None  Time spent 40 minutes-which includes 50% of the time with face-to-face with patient/ family and coordinating care related to the above  assessment and plan.  MEDICATIONS: Scheduled Meds: . antiseptic oral rinse  15 mL Mouth Rinse BID  . aspirin  81 mg Oral QODAY  . baclofen  5 mg Oral Daily  . carbidopa-levodopa  1 tablet Oral TID  . cholecalciferol  2,000 Units Oral Daily  . clopidogrel  75 mg Oral Daily  . docusate sodium  100 mg Oral Daily  . enoxaparin (LOVENOX) injection  40 mg Subcutaneous Q24H  . imipenem-cilastatin  500 mg Intravenous 3 times per day  . oxybutynin  5 mg Oral BID  . pantoprazole  40 mg Oral Daily  . venlafaxine  37.5 mg Oral BID   Continuous Infusions:  PRN Meds:.sodium chloride  Antibiotics: Anti-infectives   Start     Dose/Rate Route Frequency Ordered Stop   08/07/13 0730  imipenem-cilastatin (PRIMAXIN) 500 mg in sodium chloride 0.9 % 100 mL IVPB     500 mg 200 mL/hr over 30 Minutes Intravenous 3 times per day 08/07/13 0726     08/05/13 2200  vancomycin (VANCOCIN) IVPB 1000 mg/200 mL premix  Status:  Discontinued     1,000 mg 200 mL/hr over 60 Minutes Intravenous Every 24 hours 08/05/13 0141 08/06/13 1415   08/05/13 0600  piperacillin-tazobactam (ZOSYN) IVPB 3.375 g  Status:  Discontinued     3.375 g 12.5 mL/hr over 240 Minutes Intravenous 3 times per day 08/05/13 0141 08/07/13 0717   08/08/2013 2230  vancomycin (VANCOCIN) 1,500 mg in sodium chloride 0.9 % 500 mL IVPB     1,500 mg 250 mL/hr over 120 Minutes Intravenous  Once 08/09/2013 2216 08/05/13 0038   08/03/2013 2230  piperacillin-tazobactam (ZOSYN) IVPB 3.375 g     3.375 g 100 mL/hr over 30 Minutes Intravenous  Once 08/24/2013 2216 08/05/2013 2308  PHYSICAL EXAM: Vital signs in last 24 hours: Filed Vitals:   08/06/13 1645 08/06/13 2128 08/07/13 0244 08/07/13 0530  BP: 129/54 143/61 139/68 137/66  Pulse: 110 103 97 94  Temp: 97.9 F (36.6 C) 99.2 F (37.3 C)  98.5 F (36.9 C)  TempSrc: Oral Oral  Oral  Resp: 20 22  20   Height:      Weight:      SpO2: 94% 97%  99%    Weight change:  Filed Weights   08/05/13 0230  08/06/13 0500  Weight: 84.5 kg (186 lb 4.6 oz) 85.1 kg (187 lb 9.8 oz)   Body mass index is 35.47 kg/(m^2).   Gen Exam: Awake and alert, lethargic Neck: Supple, No JVD.   Chest: B/L Clear.   CVS: S1 S2 Regular, no murmurs.  Abdomen: soft, BS +, non tender, non distended.  Extremities: no edema, lower extremities warm to touch. Skin: No Rash.   Wounds: N/A.   Intake/Output from previous day:  Intake/Output Summary (Last 24 hours) at 08/07/13 1125 Last data filed at 08/07/13 0850  Gross per 24 hour  Intake    765 ml  Output   2130 ml  Net  -1365 ml     LAB RESULTS: CBC  Recent Labs Lab 07/26/2013 2209 08/05/13 0305 08/06/13 0440 08/07/13 0525  WBC 36.8* 42.5* 31.3* 24.9*  HGB 13.7 11.7* 10.0* 9.8*  HCT 42.2 36.7 30.3* 30.6*  PLT 324 286 211 195  MCV 93.0 92.9 93.2 93.0  MCH 30.2 29.6 30.8 29.8  MCHC 32.5 31.9 33.0 32.0  RDW 13.5 13.6 14.0 14.2  LYMPHSABS 1.5  --   --   --   MONOABS 1.8*  --   --   --   EOSABS 0.0  --   --   --   BASOSABS 0.0  --   --   --     Chemistries   Recent Labs Lab 08/03/2013 2209 08/05/13 0305 08/06/13 0440 08/07/13 0525  NA 137 142 141 143  K 4.2 4.4 3.4* 3.5*  CL 101 110 107 109  CO2 18* 15* 18* 20  GLUCOSE 147* 161* 168* 109*  BUN 13 14 16 12   CREATININE 1.43* 1.34* 1.12* 0.91  CALCIUM 7.7* 6.9* 7.1* 7.7*  MG  --  1.3*  --  1.5    CBG:  Recent Labs Lab 08/05/13 1954 08/06/13 0025 08/06/13 0443 08/06/13 0733 08/06/13 1125  GLUCAP 125* 173* 179* 153* 150*    GFR Estimated Creatinine Clearance: 56.1 ml/min (by C-G formula based on Cr of 0.91).  Coagulation profile No results found for this basename: INR, PROTIME,  in the last 168 hours  Cardiac Enzymes No results found for this basename: CK, CKMB, TROPONINI, MYOGLOBIN,  in the last 168 hours  No components found with this basename: POCBNP,  No results found for this basename: DDIMER,  in the last 72 hours No results found for this basename: HGBA1C,  in the  last 72 hours No results found for this basename: CHOL, HDL, LDLCALC, TRIG, CHOLHDL, LDLDIRECT,  in the last 72 hours No results found for this basename: TSH, T4TOTAL, FREET3, T3FREE, THYROIDAB,  in the last 72 hours No results found for this basename: VITAMINB12, FOLATE, FERRITIN, TIBC, IRON, RETICCTPCT,  in the last 72 hours No results found for this basename: LIPASE, AMYLASE,  in the last 72 hours  Urine Studies No results found for this basename: UACOL, UAPR, USPG, UPH, UTP, UGL, UKET, UBIL, UHGB, UNIT, UROB, ULEU,  UEPI, UWBC, URBC, UBAC, CAST, CRYS, UCOM, BILUA,  in the last 72 hours  MICROBIOLOGY: Recent Results (from the past 240 hour(s))  CULTURE, BLOOD (ROUTINE X 2)     Status: None   Collection Time    07/29/2013 10:28 PM      Result Value Ref Range Status   Specimen Description BLOOD WRIST LEFT   Final   Special Requests BOTTLES DRAWN AEROBIC AND ANAEROBIC 10CC   Final   Culture  Setup Time     Final   Value: 08/05/2013 12:07     Performed at [facility]   Culture     Final   Value: KLEBSIELLA PNEUMONIAE     Note: Confirmed Extended Spectrum Beta-Lactamase Producer (ESBL)     Note: Gram Stain Report Called to,Read Back By and Verified With: CHRIS HAYES 08/05/13 @ 9:31PM BY RUSCOE A. PREVIOUSLY REPORTED AS GRAM POSITIVE RODS CORRECTED RESULTS CALLED TO: MELINDA MACASERO 08/06/13 @ 10AM BY RUSCOE A. CRITICAL RESULT CALLED TO,      READ BACK BY AND VERIFIED WITH: Madlyn Frankel 08/06/13 @ 8:33PM BY RUSCOE A.     Performed at [facility]   Report Status 08/06/2013 FINAL   Final   Organism ID, Bacteria KLEBSIELLA PNEUMONIAE   Final  CULTURE, BLOOD (ROUTINE X 2)     Status: None   Collection Time    08/13/2013 10:36 PM      Result Value Ref Range Status   Specimen Description BLOOD HAND RIGHT   Final   Special Requests BOTTLES DRAWN AEROBIC AND ANAEROBIC [facility]   Final   Culture  Setup Time     Final   Value: 08/05/2013 12:07     Performed at [facility]    Culture     Final   Value:        BLOOD CULTURE RECEIVED NO GROWTH TO DATE CULTURE WILL BE HELD FOR 5 DAYS BEFORE ISSUING A FINAL NEGATIVE REPORT     Performed at [facility]   Report Status PENDING   Incomplete  URINE CULTURE     Status: None   Collection Time    07/28/2013 10:48 PM      Result Value Ref Range Status   Specimen Description URINE, CATHETERIZED   Final   Special Requests NONE   Final   Culture  Setup Time     Final   Value: 08/05/2013 12:50     Performed at [facility]     Final   Value: >=100,000 COLONIES/ML     Performed at [facility]   Culture     Final   Value: [place]     Performed at [facility]   Report Status PENDING   Incomplete  MRSA PCR SCREENING     Status: None   Collection Time    08/05/13  2:28 AM      Result Value Ref Range Status   MRSA by PCR NEGATIVE  NEGATIVE Final   Comment:            The GeneXpert MRSA Assay (FDA     approved for NASAL specimens     only), is one component of a     comprehensive MRSA colonization     surveillance program. It is not     intended to diagnose MRSA     infection nor to guide or     monitor treatment for  MRSA infections.    RADIOLOGY STUDIES/RESULTS: US Renal  08/02/2013   CLINICAL DATA:  Evaluate hydronephrosis.  EXAM: RENAL/URINARY TRACT ULTRASOUND COMPLETE  COMPARISON:  01/31/2013  FINDINGS: Right Kidney:  Length: 11.1 cm. Echogenicity within normal limits. Moderate to severe hydronephrosis. Similar to previous exam.  Left Kidney:  Length: 11.2 cm. Echogenicity within normal limits. Moderate hydronephrosis. Similar to previous exam.  Bladder:  Ureteral jets not visualized. The pre void bladder volume is equal to 92 cc. The postvoid bladder volume is equal to 54 cc.  IMPRESSION: 1. Persistent moderate to severe bilateral hydronephrosis.   Electronically Signed   By: Kerby Moors M.D.   On: 08/02/2013 13:48   US Renal Port  08/06/2013    CLINICAL DATA:  Evaluate for hydronephrosis.  EXAM: RENAL/URINARY TRACT ULTRASOUND COMPLETE  COMPARISON:  Ultrasound 08/02/2013.  FINDINGS: Right Kidney:  Length: 12.5 cm. Echogenicity within normal limits. Persistent but improved hydronephrosis.  Left Kidney:  Length: 11.9 cm. Echogenicity within normal limits. Persistent but improved hydronephrosis.  Bladder:  Bladder nondistended.  Foley catheter in bladder.  IMPRESSION: 1. Foley catheter bladder appear bladder nondistended. 2. Persistent but improved bilateral hydronephrosis.   Electronically Signed   By: Marcello Moores  Register   On: 08/06/2013 16:12   Dg Chest Port 1 View  08/06/2013   CLINICAL DATA:  Shortness of Breath  EXAM: PORTABLE CHEST - 1 VIEW  COMPARISON:  August 05, 2013  FINDINGS: There is patchy infiltrate in the left base. Elsewhere lungs are clear. Heart size and pulmonary vascularity are normal. No adenopathy. No bone lesions.  IMPRESSION: Left base infiltrate.   Electronically Signed   By: Lowella Grip M.D.   On: 08/06/2013 22:37   Dg Chest Portable 1 View  08/05/2013   CLINICAL DATA:  Hypotension.  EXAM: PORTABLE CHEST - 1 VIEW  COMPARISON:  08/05/2013  FINDINGS: Shallow inspiration. Cardiac enlargement without vascular congestion. Mild diffuse interstitial prominence again demonstrated. No evidence of any increase consolidation or infiltration since prior study. No blunting of costophrenic angles. No pneumothorax.  IMPRESSION: Shallow inspiration. Cardiac enlargement with interstitial changes. No progression since prior study.   Electronically Signed   By: Lucienne Capers M.D.   On: 08/05/2013 01:16   Dg Chest Port 1 View  07/31/2013   CLINICAL DATA:  Hypotension.  EXAM: PORTABLE CHEST - 1 VIEW  COMPARISON:  Chest radiograph January 13, 2012  FINDINGS: The cardiac silhouette appears similarly enlarged, mediastinal silhouette is nonsuspicious, patient is rotated to the right. Mild diffuse interstitial prominence, somewhat confluent in  the left lung base. Low inspiratory examination. No pleural effusions. No pneumothorax.  Osteopenia. Mild degenerative change of the thoracic spine. Multiple EKG lines overlie the patient and may obscure subtle underlying pathology.  IMPRESSION: Similar cardiomegaly, and diffuse mild interstitial prominence could reflect pulmonary edema/ atypical infection somewhat confluent left lung base.   Electronically Signed   By: Elon Alas   On: 08/16/2013 23:13    Oren Binet, MD  [facility] Pager:[phone]  If 7PM-7AM, please contact night-coverage [email or web address] Password [email or web address] 08/07/2013, 11:25 AM   LOS: 3 days   **Disclaimer: This note may have been dictated with voice recognition software. Similar sounding words can inadvertently be transcribed and this note may contain transcription errors which may not have been corrected upon publication of note.**

## 2013-08-07 NOTE — Progress Notes (Signed)
INITIAL NUTRITION ASSESSMENT  DOCUMENTATION CODES Per approved criteria  -Obesity Unspecified   INTERVENTION: -Ensure Complete BID, each provides 350 kcal, 13 g protein -Diet per SLP, patient's caretaker educated on dietary restrictions, and instructed on ordering foods per patient preference  NUTRITION DIAGNOSIS: Inadequate oral intake related to chewing difficulty as evidenced by removal of all teeth.   Goal: Patient will meet >/=90% of estimated nutrition needs  Monitor:  PO intake, chewing ability, weights, labs  Reason for Assessment: Low Braden  73 y.o. female  Admitting Dx: Infection due to ESBL-producing Klebsiella pneumoniae  ASSESSMENT: 73 yo female with progressive supranuclear palsy, neurogenic bladder with frequent UTI presenting with encephalopathy. Her family reports that at baseline she is dependent for all her ADLs, but mentally intact and conversant. They report she was in her usual state of health until this afternoon when they noticed that she did not want to open her mouth to eat when they were feeding her. She also became more lethargic throughout the day. She had all her teeth removed 2 weeks ago but did not have difficulties after the procedure.  -Patient with limited jaw mobility related to having all her teeth removed recently. Caretaker is present at bedside, reports that patient had been eating well prior to that. Since then, she has been eating soft foods and intake has decreased due to difficulty chewing and pain from stitches.  -Patient seen by SLP, diet advanced to Dysphagia 1, thin liquids. Only able to accept small amount of puree foods at a time.   Height: Ht Readings from Last 1 Encounters:  08/05/13 5\' 1"  (1.549 m)    Weight: Wt Readings from Last 1 Encounters:  08/06/13 187 lb 9.8 oz (85.1 kg)    Ideal Body Weight: 105 pounds  % Ideal Body Weight: 178%  Wt Readings from Last 10 Encounters:  08/06/13 187 lb 9.8 oz (85.1 kg)  01/14/12  180 lb 3.2 oz (81.738 kg)  07/08/11 170 lb (77.111 kg)  03/25/11 195 lb (88.451 kg)  02/03/11 194 lb (87.998 kg)  01/28/11 196 lb (88.905 kg)  07/28/10 200 lb (90.719 kg)  05/26/10 212 lb (96.163 kg)  03/26/10 215 lb (97.523 kg)  04/01/09 222 lb 6.4 oz (100.88 kg)    Usual Body Weight: 190 pounds  % Usual Body Weight: 98%  BMI:  Body mass index is 35.47 kg/(m^2). Patient is obese, class II  Estimated Nutritional Needs: Kcal: 1800-1950 kcal Protein: 110-125 g Fluid: >2.7 L/day  Skin: intact  Diet Order: Dysphagia 1, thin liquids  EDUCATION NEEDS: -No education needs identified at this time   Intake/Output Summary (Last 24 hours) at 08/07/13 1501 Last data filed at 08/07/13 0850  Gross per 24 hour  Intake    240 ml  Output   1400 ml  Net  -1160 ml    Last BM: 7/11   Labs:   Recent Labs Lab 08/07/2013 2209 08/05/13 0305 08/06/13 0440 08/07/13 0525  NA 137 142 141 143  K 4.2 4.4 3.4* 3.5*  CL 101 110 107 109  CO2 18* 15* 18* 20  BUN 13 14 16 12   CREATININE 1.43* 1.34* 1.12* 0.91  CALCIUM 7.7* 6.9* 7.1* 7.7*  MG  --  1.3*  --  1.5  PHOS  --  3.9  --  2.8  GLUCOSE 147* 161* 168* 109*    CBG (last 3)   Recent Labs  08/06/13 0443 08/06/13 0733 08/06/13 1125  GLUCAP 179* 153* 150*    Scheduled  Meds: . antiseptic oral rinse  15 mL Mouth Rinse BID  . aspirin  81 mg Oral QODAY  . baclofen  5 mg Oral Daily  . carbidopa-levodopa  1 tablet Oral TID  . cholecalciferol  2,000 Units Oral Daily  . clopidogrel  75 mg Oral Daily  . docusate sodium  100 mg Oral Daily  . enoxaparin (LOVENOX) injection  40 mg Subcutaneous Q24H  . imipenem-cilastatin  500 mg Intravenous 3 times per day  . oxybutynin  5 mg Oral BID  . pantoprazole  40 mg Oral Daily  . venlafaxine  37.5 mg Oral BID    Continuous Infusions:   Past Medical History  Diagnosis Date  . Hyperlipidemia   . Hypertension   . Asthma   . Hypokalemia   . Osteoporosis   . Osteoarthritis   .  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 Leonie Man  . Parkinsonian syndrome     saw Cecille Rubin NP   . Progressive supranuclear palsy     sees Dr. Gilford Rile at [facility]   . Neurogenic bladder     sees Dr. Diona Fanti   . Frequent UTI     sees Dr. Diona Fanti  . Neuromuscular disorder   . Bilateral hydronephrosis     sees Dr. Diona Fanti   . Esophageal stricture   . Colon polyps     adenomatous  . Internal hemorrhoids   . GERD (gastroesophageal reflux disease)     Past Surgical History  Procedure Laterality Date  . Cholecystectomy    . Breast surgery      benign left breast cyst  . Knee surgery      left knee  . Carpal tunnel release      bilateral  . Epidural steroid      to lumbar spine 8-11 and 9-11  . Esophageal dilation      per Dr. Scarlette Shorts   . Colonscopy  2010    per Dr. Henrene Pastor, benign polyps, repeat 5 years   . Esophageal dilation      dr  Henrene Pastor   . Tubal ligation    . Benign  right breat      Larey Seat, RD, LDN Pager #: [phone] After-Hours Pager #: [phone]

## 2013-08-07 NOTE — Care Management Note (Signed)
  Page 1 of 1   08/11/2013     10:56:48 AM CARE MANAGEMENT NOTE 08/11/2013  Patient:  Katherine Ware, Katherine Ware   Account Number:  [id number]  Date Initiated:  08/07/2013  Documentation initiated by:  Magdalen Spatz  Subjective/Objective Assessment:     Action/Plan:   Anticipated DC Date:  08/10/2013   Anticipated DC Plan:  [place]  In-house referral  Clinical Social Worker         Choice offered to / List presented to:  C-1 Patient           Status of service:   Medicare Important Message given?  YES (If response is "NO", the following Medicare IM given date fields will be blank) Date Medicare IM given:  08/07/2013 Medicare IM given by:  Magdalen Spatz Date Additional Medicare IM given:  08/11/2013 Additional Medicare IM given by:  Magdalen Spatz  Discharge Disposition:    Per UR Regulation:  Reviewed for med. necessity/level of care/duration of stay  If discussed at [place] of Stay Meetings, dates discussed:   08/10/2013    Comments:  08-07-13 Met with patient and sister and caregiver Addie [phone] at bedside . Plan to discharge to home with home health , Addie willing to be shown how to do IV ABX . Magdalen Spatz RN BSN

## 2013-08-07 NOTE — Progress Notes (Signed)
[facility]  Patient Status:   New Pt for [facility] this admission   [facility] is providing the following services: HHRN and Home Infusion pharmacy for home IV ABX.  Hospital coordinator team will follow pt and support transition home when deemed appropriate by MD.  If patient discharges after hours, please call [phone].   Larry Sierras 08/07/2013, 5:50 PM

## 2013-08-08 ENCOUNTER — Ambulatory Visit: Payer: Medicare Other | Admitting: Urology

## 2013-08-08 LAB — CBC
HCT: 32.6 % — ABNORMAL LOW (ref 36.0–46.0)
HEMOGLOBIN: 10.6 g/dL — AB (ref 12.0–15.0)
MCH: 30.1 pg (ref 26.0–34.0)
MCHC: 32.5 g/dL (ref 30.0–36.0)
MCV: 92.6 fL (ref 78.0–100.0)
PLATELETS: 218 10*3/uL (ref 150–400)
RBC: 3.52 MIL/uL — ABNORMAL LOW (ref 3.87–5.11)
RDW: 14.1 % (ref 11.5–15.5)
WBC: 11.9 10*3/uL — AB (ref 4.0–10.5)

## 2013-08-08 LAB — MAGNESIUM: Magnesium: 1.5 mg/dL (ref 1.5–2.5)

## 2013-08-08 LAB — PHOSPHORUS: Phosphorus: 3.2 mg/dL (ref 2.3–4.6)

## 2013-08-08 MED ORDER — STARCH (THICKENING) PO POWD
ORAL | Status: DC | PRN
  Filled 2013-08-08: qty 227

## 2013-08-08 NOTE — Progress Notes (Signed)
PATIENT DETAILS Name: Katherine Ware Age: 73 y.o. Sex: female Date of Birth: 10/16/1940 Admit Date: 08/02/2013 Admitting Physician Colbert Coyer, MD PCP:FRY,STEPHEN A, MD  Brief Summary: 73 yo female with progressive supranuclear palsy and h/o frequent UTI and chronic bilateral hydronephrosis presenting with septic shock, likely urinary origin.Admitted by PCCM to ICU, briefly required Pressors, subsequently transferred to [facility] on 7/13. Blood culture positive for ESBL Klebsiella.   Subjective: No acute issues today. Patient awake and alert, Mental status at baseline today.  Sister in room for assistance. Eating dysphagia diet with assistance  Assessment/Plan: Septic Shock: -secondary to ESBL bacteremia and UTI -resolved, BP stable, leukocytosis significantly better -on IV Primaxin-see below  ESBL Klebsiella Bacteremia: --previously on empiric Vanco/Zosyn, given positive blood cultures for ESBL switched to Primaxin on 7/13, will plan on a 14 day course -repeat blood culture on 7/14 pending, if negative place PICC line, family wanting SNF on discharge.   UTI: - Covering with Primaxin.  ARF: - Resolved, continue to monitor.  Metabolic Acidosis:  - Resolved with tx.  Acute Encephalopathy: - Resolved with tx. Patient at mental baseline.   H/o Progressive supranuclear palsy and parkinsonian syndrome  H/o lacunar strokes  -Continue home medications- Sinemet, baclofen, effexor  -Continue home medications for h/o stroke with plavix and ASA   H/o neurogenic bladder and bilateral hydronephrosis  -currently with foley catheter-plan to remove tomorrow if D/C. Family/home nurses helping patient with In and out straight cath's at home prior to admission  GERD  -PPI  Disposition: Remain inpatient. Anticipate D/C 1-2 days to SNF  DVT Prophylaxis: Prophylactic Lovenox   Code Status: Partial Code-No CPR, DNI-but ok for vasopressors and anti-arrythmics  Family  Communication Sister at bedside. Understands current circumstances and plan  Procedures:  None  CONSULTS:  None  MEDICATIONS: Scheduled Meds: . antiseptic oral rinse  15 mL Mouth Rinse BID  . aspirin  81 mg Oral QODAY  . baclofen  5 mg Oral Daily  . carbidopa-levodopa  1 tablet Oral TID  . cholecalciferol  2,000 Units Oral Daily  . clopidogrel  75 mg Oral Daily  . docusate sodium  100 mg Oral Daily  . enoxaparin (LOVENOX) injection  40 mg Subcutaneous Q24H  . feeding supplement (ENSURE COMPLETE)  237 mL Oral BID BM  . imipenem-cilastatin  500 mg Intravenous 3 times per day  . oxybutynin  5 mg Oral BID  . pantoprazole  40 mg Oral Daily  . venlafaxine  37.5 mg Oral BID   Continuous Infusions:  PRN Meds:.sodium chloride, HYDROcodone-acetaminophen  Antibiotics: Anti-infectives   Start     Dose/Rate Route Frequency Ordered Stop   08/07/13 0730  imipenem-cilastatin (PRIMAXIN) 500 mg in sodium chloride 0.9 % 100 mL IVPB     500 mg 200 mL/hr over 30 Minutes Intravenous 3 times per day 08/07/13 0726     08/05/13 2200  vancomycin (VANCOCIN) IVPB 1000 mg/200 mL premix  Status:  Discontinued     1,000 mg 200 mL/hr over 60 Minutes Intravenous Every 24 hours 08/05/13 0141 08/06/13 1415   08/05/13 0600  piperacillin-tazobactam (ZOSYN) IVPB 3.375 g  Status:  Discontinued     3.375 g 12.5 mL/hr over 240 Minutes Intravenous 3 times per day 08/05/13 0141 08/07/13 0717   08/07/2013 2230  vancomycin (VANCOCIN) 1,500 mg in sodium chloride 0.9 % 500 mL IVPB     1,500 mg 250 mL/hr over 120 Minutes Intravenous  Once 07/30/2013 2216 08/05/13  0038   08/06/2013 2230  piperacillin-tazobactam (ZOSYN) IVPB 3.375 g     3.375 g 100 mL/hr over 30 Minutes Intravenous  Once 07/29/2013 2216 08/07/2013 2308       PHYSICAL EXAM: Vital signs in last 24 hours: Filed Vitals:   08/07/13 0530 08/07/13 1500 08/07/13 2153 08/08/13 0536  BP: 137/66 142/62 122/56 129/53  Pulse: 94 102 98 91  Temp: 98.5 F (36.9 C)  98.2 F (36.8 C) 99.3 F (37.4 C) 98.9 F (37.2 C)  TempSrc: Oral Oral Axillary Axillary  Resp: 20 18 20 16   Height:      Weight:      SpO2: 99% 98% 100% 94%    Weight change:  Filed Weights   08/05/13 0230 08/06/13 0500  Weight: 84.5 kg (186 lb 4.6 oz) 85.1 kg (187 lb 9.8 oz)   Body mass index is 35.47 kg/(m^2).   Gen Exam: Awake and alert. Neck: Supple, No JVD.   Chest: B/L Clear.   CVS: S1 S2 Regular, no murmurs.  Abdomen: soft, BS +, non tender, non distended.  Extremities: no edema, lower extremities warm to touch. Able to move feet. Neurologic: At baseline. Skin: No Rash.   Wounds: N/A.    Intake/Output from previous day:  Intake/Output Summary (Last 24 hours) at 08/08/13 0935 Last data filed at 08/08/13 0537  Gross per 24 hour  Intake   2347 ml  Output   2200 ml  Net    147 ml     LAB RESULTS: CBC  Recent Labs Lab 08/22/2013 2209 08/05/13 0305 08/06/13 0440 08/07/13 0525  WBC 36.8* 42.5* 31.3* 24.9*  HGB 13.7 11.7* 10.0* 9.8*  HCT 42.2 36.7 30.3* 30.6*  PLT 324 286 211 195  MCV 93.0 92.9 93.2 93.0  MCH 30.2 29.6 30.8 29.8  MCHC 32.5 31.9 33.0 32.0  RDW 13.5 13.6 14.0 14.2  LYMPHSABS 1.5  --   --   --   MONOABS 1.8*  --   --   --   EOSABS 0.0  --   --   --   BASOSABS 0.0  --   --   --     Chemistries   Recent Labs Lab 08/02/2013 2209 08/05/13 0305 08/06/13 0440 08/07/13 0525 08/08/13 0500  NA 137 142 141 143  --   K 4.2 4.4 3.4* 3.5*  --   CL 101 110 107 109  --   CO2 18* 15* 18* 20  --   GLUCOSE 147* 161* 168* 109*  --   BUN 13 14 16 12   --   CREATININE 1.43* 1.34* 1.12* 0.91  --   CALCIUM 7.7* 6.9* 7.1* 7.7*  --   MG  --  1.3*  --  1.5 1.5    CBG:  Recent Labs Lab 08/05/13 1954 08/06/13 0025 08/06/13 0443 08/06/13 0733 08/06/13 1125  GLUCAP 125* 173* 179* 153* 150*    GFR Estimated Creatinine Clearance: 56.1 ml/min (by C-G formula based on Cr of 0.91).  Coagulation profile No results found for this basename: INR,  PROTIME,  in the last 168 hours  Cardiac Enzymes No results found for this basename: CK, CKMB, TROPONINI, MYOGLOBIN,  in the last 168 hours  No components found with this basename: POCBNP,  No results found for this basename: DDIMER,  in the last 72 hours No results found for this basename: HGBA1C,  in the last 72 hours No results found for this basename: CHOL, HDL, LDLCALC, TRIG, CHOLHDL, LDLDIRECT,  in the  last 72 hours No results found for this basename: TSH, T4TOTAL, FREET3, T3FREE, THYROIDAB,  in the last 72 hours No results found for this basename: VITAMINB12, FOLATE, FERRITIN, TIBC, IRON, RETICCTPCT,  in the last 72 hours No results found for this basename: LIPASE, AMYLASE,  in the last 72 hours  Urine Studies No results found for this basename: UACOL, UAPR, USPG, UPH, UTP, UGL, UKET, UBIL, UHGB, UNIT, UROB, ULEU, UEPI, UWBC, URBC, UBAC, CAST, CRYS, UCOM, BILUA,  in the last 72 hours  MICROBIOLOGY: Recent Results (from the past 240 hour(s))  CULTURE, BLOOD (ROUTINE X 2)     Status: None   Collection Time    08/08/2013 10:28 PM      Result Value Ref Range Status   Specimen Description BLOOD WRIST LEFT   Final   Special Requests BOTTLES DRAWN AEROBIC AND ANAEROBIC 10CC   Final   Culture  Setup Time     Final   Value: 08/05/2013 12:07     Performed at [facility]   Culture     Final   Value: KLEBSIELLA PNEUMONIAE     Note: Confirmed Extended Spectrum Beta-Lactamase Producer (ESBL)     Note: Gram Stain Report Called to,Read Back By and Verified With: CHRIS HAYES 08/05/13 @ 9:31PM BY RUSCOE A. PREVIOUSLY REPORTED AS GRAM POSITIVE RODS CORRECTED RESULTS CALLED TO: MELINDA MACASERO 08/06/13 @ 10AM BY RUSCOE A. CRITICAL RESULT CALLED TO,      READ BACK BY AND VERIFIED WITH: Madlyn Frankel 08/06/13 @ 8:33PM BY RUSCOE A.     Performed at [facility]   Report Status 08/06/2013 FINAL   Final   Organism ID, Bacteria KLEBSIELLA PNEUMONIAE   Final  CULTURE, BLOOD (ROUTINE X 2)      Status: None   Collection Time    08/01/2013 10:36 PM      Result Value Ref Range Status   Specimen Description BLOOD HAND RIGHT   Final   Special Requests BOTTLES DRAWN AEROBIC AND ANAEROBIC [facility]   Final   Culture  Setup Time     Final   Value: 08/05/2013 12:07     Performed at [facility]   Culture     Final   Value:        BLOOD CULTURE RECEIVED NO GROWTH TO DATE CULTURE WILL BE HELD FOR 5 DAYS BEFORE ISSUING A FINAL NEGATIVE REPORT     Performed at [facility]   Report Status PENDING   Incomplete  URINE CULTURE     Status: None   Collection Time    08/01/2013 10:48 PM      Result Value Ref Range Status   Specimen Description URINE, CATHETERIZED   Final   Special Requests NONE   Final   Culture  Setup Time     Final   Value: 08/05/2013 12:50     Performed at [place]     Final   Value: >=100,000 COLONIES/ML     Performed at [facility]   Culture     Final   Value: KLEBSIELLA PNEUMONIAE     Performed at [facility]   Report Status 08/07/2013 FINAL   Final   Organism ID, Bacteria KLEBSIELLA PNEUMONIAE   Final  MRSA PCR SCREENING     Status: None   Collection Time    08/05/13  2:28 AM      Result Value Ref Range Status   MRSA by PCR NEGATIVE  NEGATIVE Final   Comment:            The GeneXpert MRSA Assay (FDA     approved for NASAL specimens     only), is one component of a     comprehensive MRSA colonization     surveillance program. It is not     intended to diagnose MRSA     infection nor to guide or     monitor treatment for     MRSA infections.    RADIOLOGY STUDIES/RESULTS: US Renal  08/02/2013   CLINICAL DATA:  Evaluate hydronephrosis.  EXAM: RENAL/URINARY TRACT ULTRASOUND COMPLETE  COMPARISON:  01/31/2013  FINDINGS: Right Kidney:  Length: 11.1 cm. Echogenicity within normal limits. Moderate to severe hydronephrosis. Similar to previous exam.  Left Kidney:  Length: 11.2 cm. Echogenicity within normal  limits. Moderate hydronephrosis. Similar to previous exam.  Bladder:  Ureteral jets not visualized. The pre void bladder volume is equal to 92 cc. The postvoid bladder volume is equal to 54 cc.  IMPRESSION: 1. Persistent moderate to severe bilateral hydronephrosis.   Electronically Signed   By: Kerby Moors M.D.   On: 08/02/2013 13:48   US Renal Port  08/06/2013   CLINICAL DATA:  Evaluate for hydronephrosis.  EXAM: RENAL/URINARY TRACT ULTRASOUND COMPLETE  COMPARISON:  Ultrasound 08/02/2013.  FINDINGS: Right Kidney:  Length: 12.5 cm. Echogenicity within normal limits. Persistent but improved hydronephrosis.  Left Kidney:  Length: 11.9 cm. Echogenicity within normal limits. Persistent but improved hydronephrosis.  Bladder:  Bladder nondistended.  Foley catheter in bladder.  IMPRESSION: 1. Foley catheter bladder appear bladder nondistended. 2. Persistent but improved bilateral hydronephrosis.   Electronically Signed   By: Marcello Moores  Register   On: 08/06/2013 16:12   Dg Chest Port 1 View  08/06/2013   CLINICAL DATA:  Shortness of Breath  EXAM: PORTABLE CHEST - 1 VIEW  COMPARISON:  August 05, 2013  FINDINGS: There is patchy infiltrate in the left base. Elsewhere lungs are clear. Heart size and pulmonary vascularity are normal. No adenopathy. No bone lesions.  IMPRESSION: Left base infiltrate.   Electronically Signed   By: Lowella Grip M.D.   On: 08/06/2013 22:37   Dg Chest Portable 1 View  08/05/2013   CLINICAL DATA:  Hypotension.  EXAM: PORTABLE CHEST - 1 VIEW  COMPARISON:  08/15/2013  FINDINGS: Shallow inspiration. Cardiac enlargement without vascular congestion. Mild diffuse interstitial prominence again demonstrated. No evidence of any increase consolidation or infiltration since prior study. No blunting of costophrenic angles. No pneumothorax.  IMPRESSION: Shallow inspiration. Cardiac enlargement with interstitial changes. No progression since prior study.   Electronically Signed   By: Lucienne Capers  M.D.   On: 08/05/2013 01:16   Dg Chest Port 1 View  08/10/2013   CLINICAL DATA:  Hypotension.  EXAM: PORTABLE CHEST - 1 VIEW  COMPARISON:  Chest radiograph January 13, 2012  FINDINGS: The cardiac silhouette appears similarly enlarged, mediastinal silhouette is nonsuspicious, patient is rotated to the right. Mild diffuse interstitial prominence, somewhat confluent in the left lung base. Low inspiratory examination. No pleural effusions. No pneumothorax.  Osteopenia. Mild degenerative change of the thoracic spine. Multiple EKG lines overlie the patient and may obscure subtle underlying pathology.  IMPRESSION: Similar cardiomegaly, and diffuse mild interstitial prominence could reflect pulmonary edema/ atypical infection somewhat confluent left lung base.   Electronically Signed   By: Elon Alas   On: 08/03/2013 23:13    Danna Hefty, PA-S  [facility] Pager:[phone]  If 7PM-7AM, please contact night-coverage [email or web address] Password [email or web address] 08/08/2013, 9:35 AM   LOS: 4 days   **Disclaimer: This note may have been dictated with voice recognition software. Similar sounding words can inadvertently be transcribed and this note may contain transcription errors which may not have been corrected upon publication of note.**  Attending Patient was seen, examined,treatment plan was discussed with the Physician extender. I have directly reviewed the clinical findings, lab, imaging studies and management of this patient in detail. I have made the necessary changes to the above noted documentation, and agree with the documentation, as recorded by the Physician extender.  Nena Alexander MD [facility].

## 2013-08-08 NOTE — Progress Notes (Signed)
Speech Language Pathology Treatment: Dysphagia  Patient Details Name: Katherine Ware MRN: [id number] DOB: [birth year] Today's Date: 08/08/2013 Time: 4098-1191 SLP Time Calculation (min): 37 min  Assessment / Plan / Recommendation Clinical Impression  Upon SLP arrival, pt's voice was notably wet and sister reported that she had just given her Dr. Malachi Bonds. Pt required Max multimodal cues to attempt various methods to clear her throat with ineffective cough and throat clear. Pt's vocal quality remained clear throughout trials of Dys 1 textures and nectar thick liquids, with oral holding observed with puree only. Min cues were provided for oral clearance. Given pt's h/o dysphagia and current presentation, recommend downgrade to Dys 1 textures and nectar thick liquids. Pt, husband, and caregiver were educated regarding recommendations, and were instructed to take frequent breaks to reduce the effects of fatigue. RN updated as well. Will continue to follow for tolerance.   HPI HPI: 73 yo female with progressive supranuclear palsy and h/o frequent UTI and bilateral hydronephrosis presenting with septic shock, likely urinary origin. Caregivers report recent oral surgery with tooth extraction wtih tenderness and stitches remaining in mouth.   Pertinent Vitals n/a  SLP Plan  Continue with current plan of care    Recommendations Diet recommendations: Dysphagia 1 (puree);Nectar-thick liquid Liquids provided via: Straw Medication Administration: Crushed with puree Supervision: Staff to assist with self feeding;Full supervision/cueing for compensatory strategies Compensations: Slow rate;Small sips/bites Postural Changes and/or Swallow Maneuvers: Seated upright 90 degrees;Upright 30-60 min after meal              Oral Care Recommendations: Oral care BID Follow up Recommendations: Skilled Nursing facility Plan: Continue with current plan of care    GO      Katherine Ware, M.A.  CCC-SLP [phone]  Katherine Ware 08/08/2013, 2:52 PM

## 2013-08-09 ENCOUNTER — Inpatient Hospital Stay (HOSPITAL_COMMUNITY): Payer: Medicare Other

## 2013-08-09 LAB — PHOSPHORUS: PHOSPHORUS: 3.4 mg/dL (ref 2.3–4.6)

## 2013-08-09 LAB — MAGNESIUM: Magnesium: 1.7 mg/dL (ref 1.5–2.5)

## 2013-08-09 MED ORDER — POTASSIUM CHLORIDE CRYS ER 20 MEQ PO TBCR
40.0000 meq | EXTENDED_RELEASE_TABLET | Freq: Once | ORAL | Status: DC
  Filled 2013-08-09: qty 2

## 2013-08-09 NOTE — Progress Notes (Signed)
Speech Language Pathology Treatment: Dysphagia  Patient Details Name: Katherine Ware MRN: [id number] DOB: [birth year] Today's Date: 08/09/2013 Time: 6073-7106 SLP Time Calculation (min): 45 min  Assessment / Plan / Recommendation Clinical Impression  SLP worked with RN during medication administration to assess current diet tolerance. Pt alternated between bites of puree with meds crushed and spoonfuls of nectar thick liquids. Max cues were provided for a "strong and fast" swallow to decrease oral holding and increase efficiency of swallow. Pt was noted to have a wet vocal quality prior to initiation of PO trials which cleared with Maximal cueing; no further changes in vocal quality were noted, although pt did require additional effort to achieve minimal phonation today. RN assessed lung sounds upon completion of PO trials with lung bases diminished but clear.   SLP provided thorough education regarding high aspiration risk, particularly with fatigue, and encouraged small, frequent meals throughout the day to conserve energy. Recommend liquid administration by spoon to conserve energy as well. SLP demonstrated how to thicken liquids appropriately, and provided Min cues for caregiver to return demonstration. Will continue to follow.   HPI HPI: 73 yo female with progressive supranuclear palsy and h/o frequent UTI and bilateral hydronephrosis presenting with septic shock, likely urinary origin. Caregivers report recent oral surgery with tooth extraction wtih tenderness and stitches remaining in mouth.   Pertinent Vitals Reports no pain  SLP Plan  Continue with current plan of care    Recommendations Diet recommendations: Dysphagia 1 (puree);Nectar-thick liquid Liquids provided via: Teaspoon Medication Administration: Crushed with puree Supervision: Staff to assist with self feeding;Full supervision/cueing for compensatory strategies Compensations: Slow rate;Small sips/bites Postural Changes and/or  Swallow Maneuvers: Seated upright 90 degrees;Upright 30-60 min after meal              Oral Care Recommendations: Oral care before and after PO;Oral care BID Follow up Recommendations: Skilled Nursing facility Plan: Continue with current plan of care    GO      [name], M.A. CCC-SLP [phone]  [name] 08/09/2013, 10:45 AM

## 2013-08-09 NOTE — Progress Notes (Signed)
   CSW met with Pt's son and husband. Assessment to follow.   [facility]  [address];  [phone] Phone: [phone]

## 2013-08-09 NOTE — Progress Notes (Signed)
PATIENT DETAILS Name: Katherine Ware Age: 73 y.o. 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 Blood culture positive for ESBL Klebsiella.   Subjective:  No acute issues today. Patient awake and alert, Mental status at baseline today.  Sister in room for assistance. Eating dysphagia diet with assistance   Assessment/Plan:  Septic Shock: ESBL Klebsiella Bacteremia from UTI: -secondary to ESBL bacteremia and UTI, switched to Primaxin on 08/07/2013. Will require total of 14 days. Repeat blood cultures pending, if negative for 48 hours we'll place a PICC line and discharge with IV Primaxin.  Most likely SNF placement per family.    ARF: - Resolved, continue to monitor.  Metabolic Acidosis:  - Resolved with tx.  Acute Encephalopathy: - Resolved with tx. Patient at mental baseline.   H/o Progressive supranuclear palsy and parkinsonian syndrome , H/o lacunar strokes  -Continue home medications- Sinemet, baclofen, effexor  -Continue home medications for h/o stroke with plavix and ASA   H/o neurogenic bladder and bilateral hydronephrosis  -currently with foley catheter-plan to remove tomorrow if D/C. Family/home nurses helping patient with In and out straight cath's at home prior to admission, [place] non acute.  GERD  -PPI     Disposition: Remain inpatient. Anticipate D/C 1-2 days to SNF    DVT Prophylaxis: Prophylactic Lovenox    Code Status: Partial Code-No CPR, DNI-but ok for vasopressors and anti-arrythmics    Family Communication Sister at bedside. Understands current circumstances and  plan  Procedures:  None  CONSULTS:  None  MEDICATIONS: Scheduled Meds: . antiseptic oral rinse  15 mL Mouth Rinse BID  . aspirin  81 mg Oral QODAY  . baclofen  5 mg Oral Daily  . carbidopa-levodopa  1 tablet Oral TID  . cholecalciferol  2,000 Units Oral Daily  . clopidogrel  75 mg Oral Daily  . docusate sodium  100 mg Oral Daily  . enoxaparin (LOVENOX) injection  40 mg Subcutaneous Q24H  . feeding supplement (ENSURE COMPLETE)  237 mL Oral BID BM  . imipenem-cilastatin  500 mg Intravenous 3 times per day  . oxybutynin  5 mg Oral BID  . pantoprazole  40 mg Oral Daily  . potassium chloride  40 mEq Oral Once  . venlafaxine  37.5 mg Oral BID   Continuous Infusions:  PRN Meds:.sodium chloride, food thickener, HYDROcodone-acetaminophen  Antibiotics: Anti-infectives   Start     Dose/Rate Route Frequency Ordered Stop   08/07/13 0730  imipenem-cilastatin (PRIMAXIN) 500 mg in sodium chloride 0.9 % 100 mL IVPB     500 mg 200 mL/hr over 30 Minutes Intravenous 3 times per day 08/07/13 0726     08/05/13 2200  vancomycin (VANCOCIN) IVPB 1000 mg/200 mL premix  Status:  Discontinued     1,000 mg 200 mL/hr over 60 Minutes Intravenous Every 24 hours 08/05/13 0141 08/06/13 1415   08/05/13 0600  piperacillin-tazobactam (ZOSYN) IVPB 3.375 g  Status:  Discontinued     3.375 g 12.5 mL/hr over 240 Minutes Intravenous 3 times per day 08/05/13 0141 08/07/13 0717   08/15/2013 2230  vancomycin (VANCOCIN) 1,500 mg in sodium chloride 0.9 % 500 mL IVPB     1,500 mg 250  mL/hr over 120 Minutes Intravenous  Once 08/19/2013 2216 08/05/13 0038   08/10/2013 2230  piperacillin-tazobactam (ZOSYN) IVPB 3.375 g     3.375 g 100 mL/hr over 30 Minutes Intravenous  Once 08/22/2013 2216 08/05/2013 2308       PHYSICAL EXAM: Vital signs in last 24 hours: Filed Vitals:   08/08/13 1500 08/08/13 1851 08/08/13 2229 08/09/13 0626  BP: 130/61  143/51 148/69  Pulse: 93  95 87  Temp: 97.9 F (36.6 C) 99.3 F (37.4 C) 99.7 F  (37.6 C) 98.8 F (37.1 C)  TempSrc: Oral Oral Axillary Axillary  Resp: 20  18 18   Height:      Weight:      SpO2: 95%  97% 95%    Weight change:  Filed Weights   08/05/13 0230 08/06/13 0500  Weight: 84.5 kg (186 lb 4.6 oz) 85.1 kg (187 lb 9.8 oz)   Body mass index is 35.47 kg/(m^2).   Gen Exam: Awake and alert. But extremely weak. Neck: Supple, No JVD.   Chest: B/L Clear.   CVS: S1 S2 Regular, no murmurs.  Abdomen: soft, BS +, non tender, non distended.  Extremities: no edema, lower extremities warm to touch. Able to move feet. Neurologic: At baseline. Skin: No Rash.   Wounds: N/A.    Intake/Output from previous day:  Intake/Output Summary (Last 24 hours) at 08/09/13 1053 Last data filed at 08/09/13 6270  Gross per 24 hour  Intake    899 ml  Output   2875 ml  Net  -1976 ml     LAB RESULTS: CBC  Recent Labs Lab 08/01/2013 2209 08/05/13 0305 08/06/13 0440 08/07/13 0525 08/08/13 1057  WBC 36.8* 42.5* 31.3* 24.9* 11.9*  HGB 13.7 11.7* 10.0* 9.8* 10.6*  HCT 42.2 36.7 30.3* 30.6* 32.6*  PLT 324 286 211 195 218  MCV 93.0 92.9 93.2 93.0 92.6  MCH 30.2 29.6 30.8 29.8 30.1  MCHC 32.5 31.9 33.0 32.0 32.5  RDW 13.5 13.6 14.0 14.2 14.1  LYMPHSABS 1.5  --   --   --   --   MONOABS 1.8*  --   --   --   --   EOSABS 0.0  --   --   --   --   BASOSABS 0.0  --   --   --   --     Chemistries   Recent Labs Lab 08/03/2013 2209 08/05/13 0305 08/06/13 0440 08/07/13 0525 08/08/13 0500 08/09/13 0600  NA 137 142 141 143  --   --   K 4.2 4.4 3.4* 3.5*  --   --   CL 101 110 107 109  --   --   CO2 18* 15* 18* 20  --   --   GLUCOSE 147* 161* 168* 109*  --   --   BUN 13 14 16 12   --   --   CREATININE 1.43* 1.34* 1.12* 0.91  --   --   CALCIUM 7.7* 6.9* 7.1* 7.7*  --   --   MG  --  1.3*  --  1.5 1.5 1.7    CBG:  Recent Labs Lab 08/05/13 1954 08/06/13 0025 08/06/13 0443 08/06/13 0733 08/06/13 1125  GLUCAP 125* 173* 179* 153* 150*    GFR Estimated Creatinine  Clearance: 56.1 ml/min (by C-G formula based on Cr of 0.91).  Coagulation profile No results found for this basename: INR, PROTIME,  in the last 168 hours  Cardiac Enzymes No results found for this  basename: CK, CKMB, TROPONINI, MYOGLOBIN,  in the last 168 hours  No components found with this basename: POCBNP,  No results found for this basename: DDIMER,  in the last 72 hours No results found for this basename: HGBA1C,  in the last 72 hours No results found for this basename: CHOL, HDL, LDLCALC, TRIG, CHOLHDL, LDLDIRECT,  in the last 72 hours No results found for this basename: TSH, T4TOTAL, FREET3, T3FREE, THYROIDAB,  in the last 72 hours No results found for this basename: VITAMINB12, FOLATE, FERRITIN, TIBC, IRON, RETICCTPCT,  in the last 72 hours No results found for this basename: LIPASE, AMYLASE,  in the last 72 hours  Urine Studies No results found for this basename: UACOL, UAPR, USPG, UPH, UTP, UGL, UKET, UBIL, UHGB, UNIT, UROB, ULEU, UEPI, UWBC, URBC, UBAC, CAST, CRYS, UCOM, BILUA,  in the last 72 hours  MICROBIOLOGY: Recent Results (from the past 240 hour(s))  CULTURE, BLOOD (ROUTINE X 2)     Status: None   Collection Time    08/11/2013 10:28 PM      Result Value Ref Range Status   Specimen Description BLOOD WRIST LEFT   Final   Special Requests BOTTLES DRAWN AEROBIC AND ANAEROBIC 10CC   Final   Culture  Setup Time     Final   Value: 08/05/2013 12:07     Performed at [facility]   Culture     Final   Value: KLEBSIELLA PNEUMONIAE     Note: Confirmed Extended Spectrum Beta-Lactamase Producer (ESBL)     Note: Gram Stain Report Called to,Read Back By and Verified With: CHRIS HAYES 08/05/13 @ 9:31PM BY RUSCOE A. PREVIOUSLY REPORTED AS GRAM POSITIVE RODS CORRECTED RESULTS CALLED TO: MELINDA MACASERO 08/06/13 @ 10AM BY RUSCOE A. CRITICAL RESULT CALLED TO,      READ BACK BY AND VERIFIED WITH: Madlyn Frankel 08/06/13 @ 8:33PM BY RUSCOE A.     Performed at [facility]    Report Status 08/06/2013 FINAL   Final   Organism ID, Bacteria KLEBSIELLA PNEUMONIAE   Final  CULTURE, BLOOD (ROUTINE X 2)     Status: None   Collection Time    08/22/2013 10:36 PM      Result Value Ref Range Status   Specimen Description BLOOD HAND RIGHT   Final   Special Requests BOTTLES DRAWN AEROBIC AND ANAEROBIC [facility]   Final   Culture  Setup Time     Final   Value: 08/05/2013 12:07     Performed at [facility]   Culture     Final   Value:        BLOOD CULTURE RECEIVED NO GROWTH TO DATE CULTURE WILL BE HELD FOR 5 DAYS BEFORE ISSUING A FINAL NEGATIVE REPORT     Performed at [facility]   Report Status PENDING   Incomplete  URINE CULTURE     Status: None   Collection Time    08/16/2013 10:48 PM      Result Value Ref Range Status   Specimen Description URINE, CATHETERIZED   Final   Special Requests NONE   Final   Culture  Setup Time     Final   Value: 08/05/2013 12:50     Performed at [place]     Final   Value: >=100,000 COLONIES/ML     Performed at [facility]   Culture     Final   Value: KLEBSIELLA PNEUMONIAE  Performed at [facility]   Report Status 08/07/2013 FINAL   Final   Organism ID, Bacteria KLEBSIELLA PNEUMONIAE   Final  MRSA PCR SCREENING     Status: None   Collection Time    08/05/13  2:28 AM      Result Value Ref Range Status   MRSA by PCR NEGATIVE  NEGATIVE Final   Comment:            The GeneXpert MRSA Assay (FDA     approved for NASAL specimens     only), is one component of a     comprehensive MRSA colonization     surveillance program. It is not     intended to diagnose MRSA     infection nor to guide or     monitor treatment for     MRSA infections.  CULTURE, BLOOD (ROUTINE X 2)     Status: None   Collection Time    08/08/13  8:01 AM      Result Value Ref Range Status   Specimen Description BLOOD LEFT HAND   Final   Special Requests BOTTLES DRAWN AEROBIC ONLY 10CC   Final    Culture  Setup Time     Final   Value: 08/08/2013 16:39     Performed at [facility]   Culture     Final   Value:        BLOOD CULTURE RECEIVED NO GROWTH TO DATE CULTURE WILL BE HELD FOR 5 DAYS BEFORE ISSUING A FINAL NEGATIVE REPORT     Performed at [facility]   Report Status PENDING   Incomplete  CULTURE, BLOOD (ROUTINE X 2)     Status: None   Collection Time    08/08/13  8:16 AM      Result Value Ref Range Status   Specimen Description BLOOD LEFT HAND   Final   Special Requests BOTTLES DRAWN AEROBIC ONLY 10CC   Final   Culture  Setup Time     Final   Value: 08/08/2013 16:39     Performed at [facility]   Culture     Final   Value:        BLOOD CULTURE RECEIVED NO GROWTH TO DATE CULTURE WILL BE HELD FOR 5 DAYS BEFORE ISSUING A FINAL NEGATIVE REPORT     Performed at [facility]   Report Status PENDING   Incomplete    RADIOLOGY STUDIES/RESULTS: US Renal  08/02/2013   CLINICAL DATA:  Evaluate hydronephrosis.  EXAM: RENAL/URINARY TRACT ULTRASOUND COMPLETE  COMPARISON:  01/31/2013  FINDINGS: Right Kidney:  Length: 11.1 cm. Echogenicity within normal limits. Moderate to severe hydronephrosis. Similar to previous exam.  Left Kidney:  Length: 11.2 cm. Echogenicity within normal limits. Moderate hydronephrosis. Similar to previous exam.  Bladder:  Ureteral jets not visualized. The pre void bladder volume is equal to 92 cc. The postvoid bladder volume is equal to 54 cc.  IMPRESSION: 1. Persistent moderate to severe bilateral hydronephrosis.   Electronically Signed   By: Kerby Moors M.D.   On: 08/02/2013 13:48   US Renal Port  08/06/2013   CLINICAL DATA:  Evaluate for hydronephrosis.  EXAM: RENAL/URINARY TRACT ULTRASOUND COMPLETE  COMPARISON:  Ultrasound 08/02/2013.  FINDINGS: Right Kidney:  Length: 12.5 cm. Echogenicity within normal limits. Persistent but improved hydronephrosis.  Left Kidney:  Length: 11.9 cm. Echogenicity within normal limits. Persistent  but improved hydronephrosis.  Bladder:  Bladder nondistended.  Foley catheter in bladder.  IMPRESSION: 1. Foley catheter bladder appear bladder nondistended. 2. Persistent but improved bilateral hydronephrosis.   Electronically Signed   By: Marcello Moores  Register   On: 08/06/2013 16:12   Dg Chest Port 1 View  08/06/2013   CLINICAL DATA:  Shortness of Breath  EXAM: PORTABLE CHEST - 1 VIEW  COMPARISON:  August 05, 2013  FINDINGS: There is patchy infiltrate in the left base. Elsewhere lungs are clear. Heart size and pulmonary vascularity are normal. No adenopathy. No bone lesions.  IMPRESSION: Left base infiltrate.   Electronically Signed   By: Lowella Grip M.D.   On: 08/06/2013 22:37   Dg Chest Portable 1 View  08/05/2013   CLINICAL DATA:  Hypotension.  EXAM: PORTABLE CHEST - 1 VIEW  COMPARISON:  08/15/2013  FINDINGS: Shallow inspiration. Cardiac enlargement without vascular congestion. Mild diffuse interstitial prominence again demonstrated. No evidence of any increase consolidation or infiltration since prior study. No blunting of costophrenic angles. No pneumothorax.  IMPRESSION: Shallow inspiration. Cardiac enlargement with interstitial changes. No progression since prior study.   Electronically Signed   By: Lucienne Capers M.D.   On: 08/05/2013 01:16   Dg Chest Port 1 View  08/11/2013   CLINICAL DATA:  Hypotension.  EXAM: PORTABLE CHEST - 1 VIEW  COMPARISON:  Chest radiograph January 13, 2012  FINDINGS: The cardiac silhouette appears similarly enlarged, mediastinal silhouette is nonsuspicious, patient is rotated to the right. Mild diffuse interstitial prominence, somewhat confluent in the left lung base. Low inspiratory examination. No pleural effusions. No pneumothorax.  Osteopenia. Mild degenerative change of the thoracic spine. Multiple EKG lines overlie the patient and may obscure subtle underlying pathology.  IMPRESSION: Similar cardiomegaly, and diffuse mild interstitial prominence could reflect  pulmonary edema/ atypical infection somewhat confluent left lung base.   Electronically Signed   By: Elon Alas   On: 07/30/2013 23:13    Thurnell Lose M.D on 08/09/2013 at 10:54 AM  Between 7am to 7pm - Pager - [phone], After 7pm go to [email or web address] - password [email or web address]  And look for the night coverage person covering me after hours  [place]   LOS: 5 days   **Disclaimer: This note may have been dictated with voice recognition software. Similar sounding words can inadvertently be transcribed and this note may contain transcription errors which may not have been corrected upon publication of note.**

## 2013-08-10 LAB — PHOSPHORUS: Phosphorus: 3.1 mg/dL (ref 2.3–4.6)

## 2013-08-10 LAB — POTASSIUM
POTASSIUM: 3.3 meq/L — AB (ref 3.7–5.3)
Potassium: 2.7 mEq/L — CL (ref 3.7–5.3)

## 2013-08-10 LAB — MAGNESIUM: Magnesium: 1.7 mg/dL (ref 1.5–2.5)

## 2013-08-10 MED ORDER — POTASSIUM CHLORIDE 20 MEQ/15ML (10%) PO LIQD
40.0000 meq | Freq: Once | ORAL | Status: AC
  Administered 2013-08-10: 40 meq via ORAL
  Filled 2013-08-10: qty 30

## 2013-08-10 MED ORDER — SODIUM CHLORIDE 0.9 % IJ SOLN
10.0000 mL | INTRAMUSCULAR | Status: DC | PRN
  Administered 2013-08-10 – 2013-08-11 (×2): 10 mL

## 2013-08-10 MED ORDER — MAGNESIUM SULFATE IN D5W 10-5 MG/ML-% IV SOLN
1.0000 g | Freq: Once | INTRAVENOUS | Status: AC
  Administered 2013-08-10: 1 g via INTRAVENOUS
  Filled 2013-08-10: qty 100

## 2013-08-10 MED ORDER — POTASSIUM CHLORIDE 10 MEQ/100ML IV SOLN
10.0000 meq | INTRAVENOUS | Status: AC
  Administered 2013-08-10 (×5): 10 meq via INTRAVENOUS
  Filled 2013-08-10 (×2): qty 100

## 2013-08-10 NOTE — Progress Notes (Signed)
Speech Language Pathology Treatment: Dysphagia  Patient Details Name: Katherine Ware MRN: [id number] DOB: 12/14/1940 Today's Date: 08/10/2013 Time: 8115-7262 SLP Time Calculation (min): 15 min  Assessment / Plan / Recommendation Clinical Impression  Skilled treatment session focused on addressing dysphagia goals.  Patient selected soda which SLP thickened to a nectar-thick consistency.  Patient required Total assist for PO intake as well as Mod verbal cues for "quick" swallow initiation.  Minimal PO administered today due to Max cues for arousal/eyes open; however, with a slow pace patient appeared to tolerate trials well.    HPI HPI: 73 yo female with progressive supranuclear palsy and h/o frequent UTI and bilateral hydronephrosis presenting with septic shock, likely urinary origin. Caregivers report recent oral surgery with tooth extraction wtih tenderness and stitches remaining in mouth.   Pertinent Vitals None  SLP Plan  Continue with current plan of care    Recommendations Diet recommendations: Dysphagia 1 (puree);Nectar-thick liquid Liquids provided via: Teaspoon Medication Administration: Crushed with puree Supervision: Staff to assist with self feeding;Full supervision/cueing for compensatory strategies Compensations: Slow rate;Small sips/bites Postural Changes and/or Swallow Maneuvers: Seated upright 90 degrees;Upright 30-60 min after meal              Oral Care Recommendations: Oral care before and after PO;Oral care BID Follow up Recommendations: Skilled Nursing facility Plan: Continue with current plan of care    GO    Katherine Ware., CCC-SLP [phone]  Vazquez 08/10/2013, 4:39 PM

## 2013-08-10 NOTE — Progress Notes (Addendum)
Clinical Social Work Department BRIEF PSYCHOSOCIAL ASSESSMENT 08/10/2013  Patient:  Katherine Ware, Katherine Ware     Account Number:  [id number]     Admit date:  08/24/2013  Clinical Social Worker:  Pete Pelt, Terril  Date/Time:  08/10/2013 11:02 AM  Referred by:  Physician  Date Referred:  08/09/2013 Referred for  SNF Placement   Other Referral:   Interview type:  Family Other interview type:   CSW met with Pt son and husband.    Melissa Tomaselli [phone]  Desma Maxim [phone]    PSYCHOSOCIAL DATA Living Status:  HUSBAND Admitted from facility:   Level of care:   Primary support name:  Isobel Eisenhuth [phone] Primary support relationship to patient:  SPOUSE Degree of support available:   Pt has a care giver at home and is also taken care of by husband/family.    CURRENT CONCERNS Current Concerns  Post-Acute Placement   Other Concerns:    SOCIAL WORK ASSESSMENT / PLAN CSW met with the family Elijah Michaelis [phone] Desma Maxim [phone]) outside of the room to discuss d/c planning. Family was aware of the consult for SNF ans was awaiting CSW arrival. Pt's family has concerns about the Pt returning home at the prior living situation due to the husband is unable to continue to cover the times when the care giver is not available. Husband stated that with the new antibiotics added, he does not feel comfortable with providing the higher level of care. Husband stated "I just want to do what is best for my wife." Pt's son was able to assist Pt's husband with assessment process and stated that the family would like to have Pt near the home so that Pt's husband would be able to come and visit frequently. CSW explained that the process for d/c can move quickly and for the family to begin the process for SNF search as soon as possible. CSW encouraged the family to search near Pt's residence. CSW was given permission to search in both the [employer] and [place] areas. CSW will  provide BO's when available.   Assessment/plan status:  Information/Referral to [employer] Other assessment/ plan:    Family's choices: 1. [facility]  2. [place] [facility]   Information/referral to community resources:   CSW provided listing for both above stated counties.    PATIENT'S/FAMILY'S RESPONSE TO PLAN OF CARE: Pt's family expressed appreciation for assistance and will begin proactive search for SNF facility. CSW will follow for d/c planning and support.       CSW will continue to follow Pt for d/c planning.    [facility]  [address];  [phone] Phone: [phone]

## 2013-08-10 NOTE — Progress Notes (Signed)
Peripherally Inserted Central Catheter/Midline Placement  The IV Nurse has discussed with the patient and/or persons authorized to consent for the patient, the purpose of this procedure and the potential benefits and risks involved with this procedure.  The benefits include less needle sticks, lab draws from the catheter and patient may be discharged home with the catheter.  Risks include, but not limited to, infection, bleeding, blood clot (thrombus formation), and puncture of an artery; nerve damage and irregular heat beat.  Alternatives to this procedure were also discussed.  PICC/Midline Placement Documentation  PICC / Midline Double Lumen 76/16/07 PICC Right Basilic 36 cm 0 cm (Active)  Indication for Insertion or Continuance of Line Home intravenous therapies (PICC only) 08/10/2013 12:04 PM  Exposed Catheter (cm) 0 cm 08/10/2013 12:04 PM  Site Assessment Clean;Dry;Intact 08/10/2013 12:04 PM  Lumen #1 Status Flushed;Saline locked;Blood return noted 08/10/2013 12:04 PM  Lumen #2 Status Flushed;Saline locked;Blood return noted 08/10/2013 12:04 PM  Dressing Type Transparent 08/10/2013 12:04 PM  Dressing Status Clean;Dry;Intact;Antimicrobial disc in place 08/10/2013 12:04 PM  Line Care Connections checked and tightened 08/10/2013 12:04 PM  Dressing Intervention New dressing 08/10/2013 12:04 PM  Dressing Change Due 08/17/13 08/10/2013 12:04 PM       Rolena Infante 08/10/2013, 3:51 PM

## 2013-08-10 NOTE — Progress Notes (Signed)
ANTIBIOTIC CONSULT NOTE - Follow-up  Pharmacy Consult for Primaxin  Indication: ESBL Klebsiella Bacteremia  Allergies  Allergen Reactions  . Latex Other (See Comments)    unknown    Patient Measurements: Height: 5\' 1"  (154.9 cm) Weight: 187 lb 9.8 oz (85.1 kg) IBW/kg (Calculated) : 47.8  Vital Signs: Temp: 100 F (37.8 C) (07/16 0603) Temp src: Axillary (07/16 0603) BP: 151/63 mmHg (07/16 0603) Pulse Rate: 99 (07/16 0603)  Labs:  Recent Labs  08/08/13 1057  WBC 11.9*  HGB 10.6*  PLT 218   Estimated Creatinine Clearance: 56.1 ml/min (by C-G formula based on Cr of 0.91).  Microbiology: Recent Results (from the past 720 hour(s))  CULTURE, BLOOD (ROUTINE X 2)     Status: None   Collection Time    08/21/2013 10:28 PM      Result Value Ref Range Status   Specimen Description BLOOD WRIST LEFT   Final   Special Requests BOTTLES DRAWN AEROBIC AND ANAEROBIC 10CC   Final   Culture  Setup Time     Final   Value: 08/05/2013 12:07     Performed at [facility]   Culture     Final   Value: KLEBSIELLA PNEUMONIAE     Note: Confirmed Extended Spectrum Beta-Lactamase Producer (ESBL)     Note: Gram Stain Report Called to,Read Back By and Verified With: CHRIS HAYES 08/05/13 @ 9:31PM BY RUSCOE A. PREVIOUSLY REPORTED AS GRAM POSITIVE RODS CORRECTED RESULTS CALLED TO: MELINDA MACASERO 08/06/13 @ 10AM BY RUSCOE A. CRITICAL RESULT CALLED TO,      READ BACK BY AND VERIFIED WITH: Madlyn Frankel 08/06/13 @ 8:33PM BY RUSCOE A.     Performed at [facility]   Report Status 08/06/2013 FINAL   Final   Organism ID, Bacteria KLEBSIELLA PNEUMONIAE   Final  CULTURE, BLOOD (ROUTINE X 2)     Status: None   Collection Time    08/11/2013 10:36 PM      Result Value Ref Range Status   Specimen Description BLOOD HAND RIGHT   Final   Special Requests BOTTLES DRAWN AEROBIC AND ANAEROBIC [facility]   Final   Culture  Setup Time     Final   Value: 08/05/2013 12:07     Performed at [facility]    Culture     Final   Value:        BLOOD CULTURE RECEIVED NO GROWTH TO DATE CULTURE WILL BE HELD FOR 5 DAYS BEFORE ISSUING A FINAL NEGATIVE REPORT     Performed at [facility]   Report Status PENDING   Incomplete  URINE CULTURE     Status: None   Collection Time    07/29/2013 10:48 PM      Result Value Ref Range Status   Specimen Description URINE, CATHETERIZED   Final   Special Requests NONE   Final   Culture  Setup Time     Final   Value: 08/05/2013 12:50     Performed at [place]     Final   Value: >=100,000 COLONIES/ML     Performed at [facility]   Culture     Final   Value: KLEBSIELLA PNEUMONIAE     Performed at [facility]   Report Status 08/07/2013 FINAL   Final   Organism ID, Bacteria KLEBSIELLA PNEUMONIAE   Final  MRSA PCR SCREENING     Status: None   Collection Time  08/05/13  2:28 AM      Result Value Ref Range Status   MRSA by PCR NEGATIVE  NEGATIVE Final   Comment:            The GeneXpert MRSA Assay (FDA     approved for NASAL specimens     only), is one component of a     comprehensive MRSA colonization     surveillance program. It is not     intended to diagnose MRSA     infection nor to guide or     monitor treatment for     MRSA infections.  CULTURE, BLOOD (ROUTINE X 2)     Status: None   Collection Time    08/08/13  8:01 AM      Result Value Ref Range Status   Specimen Description BLOOD LEFT HAND   Final   Special Requests BOTTLES DRAWN AEROBIC ONLY 10CC   Final   Culture  Setup Time     Final   Value: 08/08/2013 16:39     Performed at [facility]   Culture     Final   Value:        BLOOD CULTURE RECEIVED NO GROWTH TO DATE CULTURE WILL BE HELD FOR 5 DAYS BEFORE ISSUING A FINAL NEGATIVE REPORT     Performed at [facility]   Report Status PENDING   Incomplete  CULTURE, BLOOD (ROUTINE X 2)     Status: None   Collection Time    08/08/13  8:16 AM      Result Value Ref Range  Status   Specimen Description BLOOD LEFT HAND   Final   Special Requests BOTTLES DRAWN AEROBIC ONLY 10CC   Final   Culture  Setup Time     Final   Value: 08/08/2013 16:39     Performed at [facility]   Culture     Final   Value:        BLOOD CULTURE RECEIVED NO GROWTH TO DATE CULTURE WILL BE HELD FOR 5 DAYS BEFORE ISSUING A FINAL NEGATIVE REPORT     Performed at [facility]   Report Status PENDING   Incomplete    Assessment: Pt on Primaxin Day #4/14 for ESBL klebsiella bacteremia and UTI. WBC down to 11.9. Tm 100. PICC today as repeat bld cx negative. Probably d/c to SNF.  Primaxin 7/13>> Vancomycin 7/10 >> 7/12 Zosyn 7/10 >>7/13  7/10 Blood cx>> ESBL Klebsiella (R-Zosyn, S-Primaxin) 7/10 urine cx>> Kleb (S rocephin, cipro, levo, gent, nitro, tobra, bactrim) 7/14 Bld x2>>ngtd  Goal of Therapy:  Clinical resolution   Plan:  - Primaxin 500mg  IV q8h - Trend WBC, temp, renal function  - ?ertapenem at d/c for ease of administration - see MD sticky note  Sherlon Handing, PharmD, BCPS Clinical pharmacist, pager [phone] 08/10/2013,11:36 AM

## 2013-08-10 NOTE — Progress Notes (Signed)
   CSW provided son Amarri Michaelson [phone]) verbal list of bed offers for SNF facilities via phone.   CSW reviewed MD note and Pt to be d/c in 1-2 days to SNF.   CSW will continue to follow Pt for d/c planning.    [facility]  [address];  [phone] Phone: [phone]

## 2013-08-10 NOTE — Progress Notes (Signed)
Clinical Social Work Department CLINICAL SOCIAL WORK PLACEMENT NOTE 08/10/2013  Patient:  Katherine Ware, Katherine Ware  Account Number:  [id number] Admit date:  08/15/2013  Clinical Social Worker:  Pete Pelt, CLINICAL SOCIAL WORKER  Date/time:  08/10/2013 11:15 AM  Clinical Social Work is seeking post-discharge placement for this patient at the following level of care:   SKILLED NURSING   (*CSW will update this form in Epic as items are completed)   08/09/2013  Patient/family provided with [place] Department of Clinical Social Work's list of facilities offering this level of care within the geographic area requested by the patient (or if unable, by the patient's family).  08/09/2013  Patient/family informed of their freedom to choose among providers that offer the needed level of care, that participate in Medicare, Medicaid or managed care program needed by the patient, have an available bed and are willing to accept the patient.  08/09/2013  Patient/family informed of [facility]' ownership interest in [facility], as well as of the fact that they are under no obligation to receive care at this facility.  PASARR submitted to [employer] on 01/14/2012 PASARR number received on 01/14/2012  FL2 transmitted to all facilities in geographic area requested by pt/family on  08/10/2013 FL2 transmitted to all facilities within larger geographic area on 08/10/2013  Patient informed that his/her managed care company has contracts with or will negotiate with  certain facilities, including the following:     Patient/family informed of bed offers received:   Patient chooses bed at  Physician recommends and patient chooses bed at    Patient to be transferred to  on   Patient to be transferred to facility by  Patient and family notified of transfer on  Name of family member notified:    The following physician request were entered in Epic:   Additional Comments:  [place] 1-16;  6N1-16 Phone: [phone]

## 2013-08-10 NOTE — Progress Notes (Signed)
CRITICAL VALUE ALERT  Critical value received: Potassium: 2.7  Date of notification:  08/10/2013 Time of notification:  06:50  Critical value read back: yes  Nurse who received alert:  Andreas Ohm  MD notified (1st page):  Chaney Malling  Time of first page:  06:55  MD notified (2nd page):  Time of second page:  Responding MD:   Time MD responded:

## 2013-08-10 NOTE — Progress Notes (Addendum)
PATIENT DETAILS Name: Katherine Ware Age: 73 y.o. 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 Blood culture positive for ESBL Klebsiella.    Subjective:  In bed, remains nonverbal but appears to be in no distress, appears extremely frail and deconditioned. Sister in room for assistance. Eating dysphagia diet with assistance.     Assessment/Plan:  Septic Shock: ESBL Klebsiella Bacteremia from UTI: -secondary to ESBL bacteremia and UTI, switched to Primaxin on 08/07/2013. Will require total of 14 days. Repeat blood cultures are -48 hours, place PICC line. Most likely SNF placement per family.     ARF: - Resolved, continue to monitor.    Metabolic Acidosis due to septic shock - Resolved with tx.    Acute Encephalopathy: - Resolved with tx. Patient at mental baseline.     H/o Progressive supranuclear palsy and parkinsonian syndrome , H/o lacunar strokes  -Continue home medications- Sinemet, baclofen, effexor  -Continue home medications for h/o stroke with plavix and ASA      Bedbound status with severe generalized weakness and deconditioning, dysphagia secondary to underlying progressive supranuclear patency and parkinsonian syndrome and lacunar strokes.  She is on dysphagia 1 diet with nectar thick liquids, remains very high risk for aspiration, family aware, she is DNR/DNI, family does want IV medications to continue if needed. Overall long-term prognosis is very poor.     Hypokalemia  Replaced IV and by mouth, repeat potassium and magnesium levels this evening.     H/o neurogenic bladder and  bilateral hydronephrosis  -DC Foley on 08/10/2013. Family/home nurses helping patient with In and out straight cath's at home prior to admission, [place] non acute. Will monitor bladder scan every 6hrs with in and out catheter as needed    GERD  -PPI      Disposition: Remain inpatient. Anticipate D/C 1-2 days to SNF    DVT Prophylaxis: Prophylactic Lovenox    Code Status: Partial Code-No CPR, DNI-but ok for vasopressors and anti-arrythmics    Family Communication Sister at bedside. Understands current circumstances and plan  Procedures:  None  CONSULTS:  None  MEDICATIONS: Scheduled Meds: . antiseptic oral rinse  15 mL Mouth Rinse BID  . aspirin  81 mg Oral QODAY  . baclofen  5 mg Oral Daily  . carbidopa-levodopa  1 tablet Oral TID  . cholecalciferol  2,000 Units Oral Daily  . clopidogrel  75 mg Oral Daily  . docusate sodium  100 mg Oral Daily  . enoxaparin (LOVENOX) injection  40 mg Subcutaneous Q24H  . feeding supplement (ENSURE COMPLETE)  237 mL Oral BID BM  . imipenem-cilastatin  500 mg Intravenous 3 times per day  . oxybutynin  5 mg Oral BID  . pantoprazole  40 mg Oral Daily  . potassium chloride  10 mEq Intravenous Q1 Hr x 5  . potassium chloride  40 mEq Oral Once  . venlafaxine  37.5 mg Oral BID   Continuous Infusions:  PRN Meds:.sodium chloride, food thickener, HYDROcodone-acetaminophen  Antibiotics: Anti-infectives   Start     Dose/Rate Route Frequency Ordered Stop   08/07/13 0730  imipenem-cilastatin (PRIMAXIN) 500 mg in sodium chloride 0.9 % 100 mL IVPB  500 mg 200 mL/hr over 30 Minutes Intravenous 3 times per day 08/07/13 0726     08/05/13 2200  vancomycin (VANCOCIN) IVPB 1000 mg/200 mL premix  Status:  Discontinued     1,000 mg 200 mL/hr over 60 Minutes Intravenous Every 24 hours 08/05/13 0141 08/06/13 1415   08/05/13 0600  piperacillin-tazobactam (ZOSYN) IVPB 3.375 g  Status:  Discontinued     3.375 g 12.5 mL/hr over 240 Minutes  Intravenous 3 times per day 08/05/13 0141 08/07/13 0717   08/15/2013 2230  vancomycin (VANCOCIN) 1,500 mg in sodium chloride 0.9 % 500 mL IVPB     1,500 mg 250 mL/hr over 120 Minutes Intravenous  Once 08/07/2013 2216 08/05/13 0038   08/06/2013 2230  piperacillin-tazobactam (ZOSYN) IVPB 3.375 g     3.375 g 100 mL/hr over 30 Minutes Intravenous  Once 08/21/2013 2216 08/16/2013 2308       PHYSICAL EXAM: Vital signs in last 24 hours: Filed Vitals:   08/09/13 1340 08/09/13 2058 08/09/13 2100 08/10/13 0603  BP: 161/74 143/67  151/63  Pulse: 101 100  99  Temp: 99.7 F (37.6 C) 99.5 F (37.5 C)  100 F (37.8 C)  TempSrc: Oral Axillary  Axillary  Resp: 22 20 20 21   Height:      Weight:      SpO2: 98% 98% 98% 96%    Weight change:  Filed Weights   08/05/13 0230 08/06/13 0500  Weight: 84.5 kg (186 lb 4.6 oz) 85.1 kg (187 lb 9.8 oz)   Body mass index is 35.47 kg/(m^2).   Gen Exam: Awake and alert. But extremely weak. Neck: Supple, No JVD.   Chest: B/L Clear.   CVS: S1 S2 Regular, no murmurs.  Abdomen: soft, BS +, non tender, non distended.  Extremities: no edema, lower extremities warm to touch. Able to move feet. Neurologic: At baseline. Skin: No Rash.   Wounds: N/A.    Intake/Output from previous day:  Intake/Output Summary (Last 24 hours) at 08/10/13 1110 Last data filed at 08/10/13 1000  Gross per 24 hour  Intake     60 ml  Output   2025 ml  Net  -1965 ml     LAB RESULTS: CBC  Recent Labs Lab 08/05/2013 2209 08/05/13 0305 08/06/13 0440 08/07/13 0525 08/08/13 1057  WBC 36.8* 42.5* 31.3* 24.9* 11.9*  HGB 13.7 11.7* 10.0* 9.8* 10.6*  HCT 42.2 36.7 30.3* 30.6* 32.6*  PLT 324 286 211 195 218  MCV 93.0 92.9 93.2 93.0 92.6  MCH 30.2 29.6 30.8 29.8 30.1  MCHC 32.5 31.9 33.0 32.0 32.5  RDW 13.5 13.6 14.0 14.2 14.1  LYMPHSABS 1.5  --   --   --   --   MONOABS 1.8*  --   --   --   --   EOSABS 0.0  --   --   --   --   BASOSABS 0.0  --   --   --   --     Chemistries    Recent Labs Lab 08/19/2013 2209 08/05/13 0305 08/06/13 0440 08/07/13 0525 08/08/13 0500 08/09/13 0600 08/10/13 0545  NA 137 142 141 143  --   --   --   K 4.2 4.4 3.4* 3.5*  --   --  2.7*  CL 101 110 107 109  --   --   --   CO2 18* 15* 18* 20  --   --   --   GLUCOSE 147* 161* 168* 109*  --   --   --  BUN 13 14 16 12   --   --   --   CREATININE 1.43* 1.34* 1.12* 0.91  --   --   --   CALCIUM 7.7* 6.9* 7.1* 7.7*  --   --   --   MG  --  1.3*  --  1.5 1.5 1.7 1.7    CBG:  Recent Labs Lab 08/05/13 1954 08/06/13 0025 08/06/13 0443 08/06/13 0733 08/06/13 1125  GLUCAP 125* 173* 179* 153* 150*    GFR Estimated Creatinine Clearance: 56.1 ml/min (by C-G formula based on Cr of 0.91).  Coagulation profile No results found for this basename: INR, PROTIME,  in the last 168 hours  Cardiac Enzymes No results found for this basename: CK, CKMB, TROPONINI, MYOGLOBIN,  in the last 168 hours  No components found with this basename: POCBNP,  No results found for this basename: DDIMER,  in the last 72 hours No results found for this basename: HGBA1C,  in the last 72 hours No results found for this basename: CHOL, HDL, LDLCALC, TRIG, CHOLHDL, LDLDIRECT,  in the last 72 hours No results found for this basename: TSH, T4TOTAL, FREET3, T3FREE, THYROIDAB,  in the last 72 hours No results found for this basename: VITAMINB12, FOLATE, FERRITIN, TIBC, IRON, RETICCTPCT,  in the last 72 hours No results found for this basename: LIPASE, AMYLASE,  in the last 72 hours  Urine Studies No results found for this basename: UACOL, UAPR, USPG, UPH, UTP, UGL, UKET, UBIL, UHGB, UNIT, UROB, ULEU, UEPI, UWBC, URBC, UBAC, CAST, CRYS, UCOM, BILUA,  in the last 72 hours  MICROBIOLOGY: Recent Results (from the past 240 hour(s))  CULTURE, BLOOD (ROUTINE X 2)     Status: None   Collection Time    08/25/2013 10:28 PM      Result Value Ref Range Status   Specimen Description BLOOD WRIST LEFT   Final   Special  Requests BOTTLES DRAWN AEROBIC AND ANAEROBIC 10CC   Final   Culture  Setup Time     Final   Value: 08/05/2013 12:07     Performed at [facility]   Culture     Final   Value: KLEBSIELLA PNEUMONIAE     Note: Confirmed Extended Spectrum Beta-Lactamase Producer (ESBL)     Note: Gram Stain Report Called to,Read Back By and Verified With: CHRIS HAYES 08/05/13 @ 9:31PM BY RUSCOE A. PREVIOUSLY REPORTED AS GRAM POSITIVE RODS CORRECTED RESULTS CALLED TO: MELINDA MACASERO 08/06/13 @ 10AM BY RUSCOE A. CRITICAL RESULT CALLED TO,      READ BACK BY AND VERIFIED WITH: Madlyn Frankel 08/06/13 @ 8:33PM BY RUSCOE A.     Performed at [facility]   Report Status 08/06/2013 FINAL   Final   Organism ID, Bacteria KLEBSIELLA PNEUMONIAE   Final  CULTURE, BLOOD (ROUTINE X 2)     Status: None   Collection Time    08/22/2013 10:36 PM      Result Value Ref Range Status   Specimen Description BLOOD HAND RIGHT   Final   Special Requests BOTTLES DRAWN AEROBIC AND ANAEROBIC [facility]   Final   Culture  Setup Time     Final   Value: 08/05/2013 12:07     Performed at [facility]   Culture     Final   Value:        BLOOD CULTURE RECEIVED NO GROWTH TO DATE CULTURE WILL BE HELD FOR 5 DAYS BEFORE ISSUING A FINAL NEGATIVE REPORT  Performed at [facility]   Report Status PENDING   Incomplete  URINE CULTURE     Status: None   Collection Time    08/01/2013 10:48 PM      Result Value Ref Range Status   Specimen Description URINE, CATHETERIZED   Final   Special Requests NONE   Final   Culture  Setup Time     Final   Value: 08/05/2013 12:50     Performed at [place]     Final   Value: >=100,000 COLONIES/ML     Performed at [facility]   Culture     Final   Value: KLEBSIELLA PNEUMONIAE     Performed at [facility]   Report Status 08/07/2013 FINAL   Final   Organism ID, Bacteria KLEBSIELLA PNEUMONIAE   Final  MRSA PCR SCREENING     Status: None    Collection Time    08/05/13  2:28 AM      Result Value Ref Range Status   MRSA by PCR NEGATIVE  NEGATIVE Final   Comment:            The GeneXpert MRSA Assay (FDA     approved for NASAL specimens     only), is one component of a     comprehensive MRSA colonization     surveillance program. It is not     intended to diagnose MRSA     infection nor to guide or     monitor treatment for     MRSA infections.  CULTURE, BLOOD (ROUTINE X 2)     Status: None   Collection Time    08/08/13  8:01 AM      Result Value Ref Range Status   Specimen Description BLOOD LEFT HAND   Final   Special Requests BOTTLES DRAWN AEROBIC ONLY 10CC   Final   Culture  Setup Time     Final   Value: 08/08/2013 16:39     Performed at [facility]   Culture     Final   Value:        BLOOD CULTURE RECEIVED NO GROWTH TO DATE CULTURE WILL BE HELD FOR 5 DAYS BEFORE ISSUING A FINAL NEGATIVE REPORT     Performed at [facility]   Report Status PENDING   Incomplete  CULTURE, BLOOD (ROUTINE X 2)     Status: None   Collection Time    08/08/13  8:16 AM      Result Value Ref Range Status   Specimen Description BLOOD LEFT HAND   Final   Special Requests BOTTLES DRAWN AEROBIC ONLY 10CC   Final   Culture  Setup Time     Final   Value: 08/08/2013 16:39     Performed at [facility]   Culture     Final   Value:        BLOOD CULTURE RECEIVED NO GROWTH TO DATE CULTURE WILL BE HELD FOR 5 DAYS BEFORE ISSUING A FINAL NEGATIVE REPORT     Performed at [facility]   Report Status PENDING   Incomplete    RADIOLOGY STUDIES/RESULTS: US Renal  08/02/2013   CLINICAL DATA:  Evaluate hydronephrosis.  EXAM: RENAL/URINARY TRACT ULTRASOUND COMPLETE  COMPARISON:  01/31/2013  FINDINGS: Right Kidney:  Length: 11.1 cm. Echogenicity within normal limits. Moderate to severe hydronephrosis. Similar to previous exam.  Left Kidney:  Length: 11.2 cm. Echogenicity within normal limits. Moderate  hydronephrosis. Similar  to previous exam.  Bladder:  Ureteral jets not visualized. The pre void bladder volume is equal to 92 cc. The postvoid bladder volume is equal to 54 cc.  IMPRESSION: 1. Persistent moderate to severe bilateral hydronephrosis.   Electronically Signed   By: Kerby Moors M.D.   On: 08/02/2013 13:48   US Renal Port  08/06/2013   CLINICAL DATA:  Evaluate for hydronephrosis.  EXAM: RENAL/URINARY TRACT ULTRASOUND COMPLETE  COMPARISON:  Ultrasound 08/02/2013.  FINDINGS: Right Kidney:  Length: 12.5 cm. Echogenicity within normal limits. Persistent but improved hydronephrosis.  Left Kidney:  Length: 11.9 cm. Echogenicity within normal limits. Persistent but improved hydronephrosis.  Bladder:  Bladder nondistended.  Foley catheter in bladder.  IMPRESSION: 1. Foley catheter bladder appear bladder nondistended. 2. Persistent but improved bilateral hydronephrosis.   Electronically Signed   By: Marcello Moores  Register   On: 08/06/2013 16:12   Dg Chest Port 1 View  08/06/2013   CLINICAL DATA:  Shortness of Breath  EXAM: PORTABLE CHEST - 1 VIEW  COMPARISON:  August 05, 2013  FINDINGS: There is patchy infiltrate in the left base. Elsewhere lungs are clear. Heart size and pulmonary vascularity are normal. No adenopathy. No bone lesions.  IMPRESSION: Left base infiltrate.   Electronically Signed   By: Lowella Grip M.D.   On: 08/06/2013 22:37   Dg Chest Portable 1 View  08/05/2013   CLINICAL DATA:  Hypotension.  EXAM: PORTABLE CHEST - 1 VIEW  COMPARISON:  08/21/2013  FINDINGS: Shallow inspiration. Cardiac enlargement without vascular congestion. Mild diffuse interstitial prominence again demonstrated. No evidence of any increase consolidation or infiltration since prior study. No blunting of costophrenic angles. No pneumothorax.  IMPRESSION: Shallow inspiration. Cardiac enlargement with interstitial changes. No progression since prior study.   Electronically Signed   By: Lucienne Capers M.D.   On: 08/05/2013 01:16   Dg Chest  Port 1 View  08/06/2013   CLINICAL DATA:  Hypotension.  EXAM: PORTABLE CHEST - 1 VIEW  COMPARISON:  Chest radiograph January 13, 2012  FINDINGS: The cardiac silhouette appears similarly enlarged, mediastinal silhouette is nonsuspicious, patient is rotated to the right. Mild diffuse interstitial prominence, somewhat confluent in the left lung base. Low inspiratory examination. No pleural effusions. No pneumothorax.  Osteopenia. Mild degenerative change of the thoracic spine. Multiple EKG lines overlie the patient and may obscure subtle underlying pathology.  IMPRESSION: Similar cardiomegaly, and diffuse mild interstitial prominence could reflect pulmonary edema/ atypical infection somewhat confluent left lung base.   Electronically Signed   By: Elon Alas   On: 08/15/2013 23:13    Thurnell Lose M.D on 08/10/2013 at 11:10 AM  Between 7am to 7pm - Pager - [phone], After 7pm go to [email or web address] - password [email or web address]  And look for the night coverage person covering me after hours  [place]   LOS: 6 days   **Disclaimer: This note may have been dictated with voice recognition software. Similar sounding words can inadvertently be transcribed and this note may contain transcription errors which may not have been corrected upon publication of note.**

## 2013-08-11 ENCOUNTER — Inpatient Hospital Stay (HOSPITAL_COMMUNITY): Payer: Medicare Other

## 2013-08-11 LAB — CULTURE, BLOOD (ROUTINE X 2): CULTURE: NO GROWTH

## 2013-08-11 MED ORDER — POTASSIUM CHLORIDE 10 MEQ/100ML IV SOLN
10.0000 meq | INTRAVENOUS | Status: AC
  Administered 2013-08-11 (×4): 10 meq via INTRAVENOUS
  Filled 2013-08-11 (×3): qty 100

## 2013-08-11 MED ORDER — ENSURE PUDDING PO PUDG
1.0000 | Freq: Two times a day (BID) | ORAL | Status: DC
  Administered 2013-08-11: 1 via ORAL

## 2013-08-11 MED ORDER — ENSURE COMPLETE PO LIQD: 237.0000 mL | ORAL | Status: DC

## 2013-08-11 MED ORDER — MAGNESIUM SULFATE IN D5W 10-5 MG/ML-% IV SOLN
1.0000 g | Freq: Once | INTRAVENOUS | Status: AC
  Administered 2013-08-11: 1 g via INTRAVENOUS
  Filled 2013-08-11: qty 100

## 2013-08-11 NOTE — Progress Notes (Signed)
   CSW received a call from [place] at [facility] and the Pt's family have chosen their facility for placement and have completed necessary paperwork for weekend d/c.   Facility will accept Pt on the weekend when ready for d/c.   CSW will continue to follow Pt for d/c planning.    [facility]  [address];  [phone] Phone: [phone]

## 2013-08-11 NOTE — Progress Notes (Signed)
NUTRITION FOLLOW UP  Intervention:   -Ensure Complete once daily, thickened to nectar consistency.  -Ensure Pudding BID, each provides 170 kcal, 4 g protein  Nutrition Dx:   Inadequate oral intake related to chewing difficulty as evidenced by removal of all teeth.   Goal:  Patient will meet >/=90% of estimated nutrition needs   Monitor:  PO intake, chewing ability, weights, labs  Assessment:   73 yo female with progressive supranuclear palsy, neurogenic bladder with frequent UTI presenting with encephalopathy. Her family reports that at baseline she is dependent for all her ADLs, but mentally intact and conversant. They report she was in her usual state of health until this afternoon when they noticed that she did not want to open her mouth to eat when they were feeding her. She also became more lethargic throughout the day. She had all her teeth removed 2 weeks ago but did not have difficulties after the procedure.  Diet downgraded to Dysphagia 1, nectar-thick liquids due to ineffective cough and throat clear. SLP recommends small, frequent meals with frequent breaks to reduce fatigue. Per RN, patient with limited intake (10-25% of meals), likely due to fatigue when eating and inability to feed herself.   She is still receiving Ensure Complete by spoon. This will need to be thickened to the appropriate consistency.   Patient with limited jaw mobility related to having all her teeth removed recently.  Height: Ht Readings from Last 1 Encounters:  08/05/13 5\' 1"  (1.549 m)    Weight Status:   Wt Readings from Last 1 Encounters:  08/06/13 187 lb 9.8 oz (85.1 kg)    Re-estimated needs:  Kcal: 1800-1950 kcal  Protein: 110-125 g  Fluid: >2.7 L/day   Skin: intact  Diet Order: Dysphagia 1, nectar-thick liquids   Intake/Output Summary (Last 24 hours) at 08/11/13 1509 Last data filed at 08/11/13 1505  Gross per 24 hour  Intake    720 ml  Output      0 ml  Net    720 ml     Last BM: 7/17   Labs:   Recent Labs Lab 08/05/13 0305 08/06/13 0440 08/07/13 0525 08/08/13 0500 08/09/13 0600 08/10/13 0545 08/10/13 1415  NA 142 141 143  --   --   --   --   K 4.4 3.4* 3.5*  --   --  2.7* 3.3*  CL 110 107 109  --   --   --   --   CO2 15* 18* 20  --   --   --   --   BUN 14 16 12   --   --   --   --   CREATININE 1.34* 1.12* 0.91  --   --   --   --   CALCIUM 6.9* 7.1* 7.7*  --   --   --   --   MG 1.3*  --  1.5 1.5 1.7 1.7  --   PHOS 3.9  --  2.8 3.2 3.4 3.1  --   GLUCOSE 161* 168* 109*  --   --   --   --     CBG (last 3)  No results found for this basename: GLUCAP,  in the last 72 hours  Scheduled Meds: . antiseptic oral rinse  15 mL Mouth Rinse BID  . aspirin  81 mg Oral QODAY  . baclofen  5 mg Oral Daily  . carbidopa-levodopa  1 tablet Oral TID  . cholecalciferol  2,000 Units Oral  Daily  . clopidogrel  75 mg Oral Daily  . docusate sodium  100 mg Oral Daily  . enoxaparin (LOVENOX) injection  40 mg Subcutaneous Q24H  . feeding supplement (ENSURE COMPLETE)  237 mL Oral BID BM  . imipenem-cilastatin  500 mg Intravenous 3 times per day  . oxybutynin  5 mg Oral BID  . pantoprazole  40 mg Oral Daily  . venlafaxine  37.5 mg Oral BID    Continuous Infusions:   Larey Seat, RD, LDN Pager #: [phone] After-Hours Pager #: [phone]

## 2013-08-11 NOTE — Progress Notes (Signed)
PATIENT DETAILS Name: Katherine Ware Age: 73 y.o. Sex: female Date of Birth: Aug 20, 1940 Admit Date: 08/11/2013 Admitting Physician Colbert Coyer, MD PCP:FRY,STEPHEN A, MD   Brief Summary:  73 yo female with progressive supranuclear palsy and h/o frequent UTI and chronic bilateral hydronephrosis presenting with septic shock, likely urinary origin.Admitted by PCCM to ICU, briefly required Pressors, subsequently transferred to [facility] on 7/13. Blood culture positive for ESBL Klebsiella.    Subjective:  In bed, remains nonverbal but appears to be in mild SOB, appears extremely frail and deconditioned.Son in room for assistance. Eating dysphagia diet 1 with assistance.     Assessment/Plan:  Septic Shock: ESBL Klebsiella Bacteremia from UTI: -secondary to ESBL bacteremia and UTI, switched to Primaxin on 08/07/2013. Will require total of 14 days. Repeat blood cultures are -48 hours, place PICC line. Most likely SNF placement per family.     Bedbound status with severe generalized weakness and deconditioning, dysphagia secondary to underlying progressive supranuclear patency and parkinsonian syndrome and lacunar strokes.  She is on dysphagia 1 diet with nectar thick liquids, remains very high risk for aspiration, family made aware, I personally think clinically she is having microaspiration of oral secretions and her diet, we'll continue to provide supportive care, and pulmonary toiletry, she is DNR/DNI, family does want IV medications to continue if needed. Overall long-term prognosis is very poor. Will repeat chest x-ray. For now continue Primaxin.    ARF: - Resolved, continue to monitor.    Metabolic Acidosis due to septic shock - Resolved with tx.    Acute Encephalopathy: - Resolved with tx. Patient at mental baseline.     H/o Progressive supranuclear palsy and parkinsonian syndrome , H/o lacunar strokes  -Continue home medications- Sinemet, baclofen, effexor    -Continue home medications for h/o stroke with plavix and ASA     Hypokalemia  Replaced IV and by mouth, repeat potassium and magnesium levels this evening.     H/o neurogenic bladder and bilateral hydronephrosis  -DC Foley on 08/10/2013. Family/home nurses helping patient with In and out straight cath's at home prior to admission, [place] non acute. Will monitor bladder scan every 6hrs with in and out catheter as needed    GERD  -PPI      Disposition: Remain inpatient. Anticipate D/C 1-2 days to SNF    DVT Prophylaxis: Prophylactic Lovenox    Code Status: No CPR or intubation.    Family Communication Son at bedside. Understands current circumstances and plan including possible ongoing microaspiration poor prognosis  Procedures:  None  CONSULTS:  None  MEDICATIONS: Scheduled Meds: . antiseptic oral rinse  15 mL Mouth Rinse BID  . aspirin  81 mg Oral QODAY  . baclofen  5 mg Oral Daily  . carbidopa-levodopa  1 tablet Oral TID  . cholecalciferol  2,000 Units Oral Daily  . clopidogrel  75 mg Oral Daily  . docusate sodium  100 mg Oral Daily  . enoxaparin (LOVENOX) injection  40 mg Subcutaneous Q24H  . feeding supplement (ENSURE COMPLETE)  237 mL Oral BID BM  . imipenem-cilastatin  500 mg Intravenous 3 times per day  . oxybutynin  5 mg Oral BID  . pantoprazole  40 mg Oral Daily  . potassium chloride  10 mEq Intravenous Q1 Hr x 4  . venlafaxine  37.5 mg Oral BID   Continuous Infusions:  PRN Meds:.sodium chloride, food thickener, HYDROcodone-acetaminophen, sodium chloride  Antibiotics: Anti-infectives   Start  Dose/Rate Route Frequency Ordered Stop   08/07/13 0730  imipenem-cilastatin (PRIMAXIN) 500 mg in sodium chloride 0.9 % 100 mL IVPB     500 mg 200 mL/hr over 30 Minutes Intravenous 3 times per day 08/07/13 0726     08/05/13 2200  vancomycin (VANCOCIN) IVPB 1000 mg/200 mL premix  Status:  Discontinued     1,000 mg 200 mL/hr over 60 Minutes  Intravenous Every 24 hours 08/05/13 0141 08/06/13 1415   08/05/13 0600  piperacillin-tazobactam (ZOSYN) IVPB 3.375 g  Status:  Discontinued     3.375 g 12.5 mL/hr over 240 Minutes Intravenous 3 times per day 08/05/13 0141 08/07/13 0717   08/03/2013 2230  vancomycin (VANCOCIN) 1,500 mg in sodium chloride 0.9 % 500 mL IVPB     1,500 mg 250 mL/hr over 120 Minutes Intravenous  Once 07/27/2013 2216 08/11/13 0642   08/13/2013 2230  piperacillin-tazobactam (ZOSYN) IVPB 3.375 g     3.375 g 100 mL/hr over 30 Minutes Intravenous  Once 07/29/2013 2216 08/11/2013 2308       PHYSICAL EXAM: Vital signs in last 24 hours: Filed Vitals:   08/09/13 2100 08/10/13 0603 08/10/13 2243 08/11/13 0656  BP:  151/63 153/77 165/83  Pulse:  99 99 101  Temp:  100 F (37.8 C) 99.1 F (37.3 C) 99.1 F (37.3 C)  TempSrc:  Axillary Oral Oral  Resp: 20 21 24 24   Height:      Weight:      SpO2: 98% 96% 94% 95%    Weight change:  Filed Weights   08/05/13 0230 08/06/13 0500  Weight: 84.5 kg (186 lb 4.6 oz) 85.1 kg (187 lb 9.8 oz)   Body mass index is 35.47 kg/(m^2).   Gen Exam: Awake and alert. But extremely weak and looks little short of breath today Neck: Supple, No JVD.   Chest: Bibasilar coarse breath sounds.   CVS: S1 S2 Regular, no murmurs.  Abdomen: soft, BS +, non tender, non distended.  Extremities: no edema, lower extremities warm to touch. Able to move feet. Neurologic: At baseline. Skin: No Rash.   Wounds: N/A.    Intake/Output from previous day:  Intake/Output Summary (Last 24 hours) at 08/11/13 0952 Last data filed at 08/10/13 1726  Gross per 24 hour  Intake    889 ml  Output    600 ml  Net    289 ml     LAB RESULTS: CBC  Recent Labs Lab 08/25/2013 2209 08/05/13 0305 08/06/13 0440 08/07/13 0525 08/08/13 1057  WBC 36.8* 42.5* 31.3* 24.9* 11.9*  HGB 13.7 11.7* 10.0* 9.8* 10.6*  HCT 42.2 36.7 30.3* 30.6* 32.6*  PLT 324 286 211 195 218  MCV 93.0 92.9 93.2 93.0 92.6  MCH 30.2 29.6  30.8 29.8 30.1  MCHC 32.5 31.9 33.0 32.0 32.5  RDW 13.5 13.6 14.0 14.2 14.1  LYMPHSABS 1.5  --   --   --   --   MONOABS 1.8*  --   --   --   --   EOSABS 0.0  --   --   --   --   BASOSABS 0.0  --   --   --   --     Chemistries   Recent Labs Lab 08/01/2013 2209 08/05/13 0305 08/06/13 0440 08/07/13 0525 08/08/13 0500 08/09/13 0600 08/10/13 0545 08/10/13 1415  NA 137 142 141 143  --   --   --   --   K 4.2 4.4 3.4* 3.5*  --   --  2.7* 3.3*  CL 101 110 107 109  --   --   --   --   CO2 18* 15* 18* 20  --   --   --   --   GLUCOSE 147* 161* 168* 109*  --   --   --   --   BUN 13 14 16 12   --   --   --   --   CREATININE 1.43* 1.34* 1.12* 0.91  --   --   --   --   CALCIUM 7.7* 6.9* 7.1* 7.7*  --   --   --   --   MG  --  1.3*  --  1.5 1.5 1.7 1.7  --     CBG:  Recent Labs Lab 08/05/13 1954 08/06/13 0025 08/06/13 0443 08/06/13 0733 08/06/13 1125  GLUCAP 125* 173* 179* 153* 150*    GFR Estimated Creatinine Clearance: 56.1 ml/min (by C-G formula based on Cr of 0.91).  Coagulation profile No results found for this basename: INR, PROTIME,  in the last 168 hours  Cardiac Enzymes No results found for this basename: CK, CKMB, TROPONINI, MYOGLOBIN,  in the last 168 hours  No components found with this basename: POCBNP,  No results found for this basename: DDIMER,  in the last 72 hours No results found for this basename: HGBA1C,  in the last 72 hours No results found for this basename: CHOL, HDL, LDLCALC, TRIG, CHOLHDL, LDLDIRECT,  in the last 72 hours No results found for this basename: TSH, T4TOTAL, FREET3, T3FREE, THYROIDAB,  in the last 72 hours No results found for this basename: VITAMINB12, FOLATE, FERRITIN, TIBC, IRON, RETICCTPCT,  in the last 72 hours No results found for this basename: LIPASE, AMYLASE,  in the last 72 hours  Urine Studies No results found for this basename: UACOL, UAPR, USPG, UPH, UTP, UGL, UKET, UBIL, UHGB, UNIT, UROB, ULEU, UEPI, UWBC, URBC, UBAC,  CAST, CRYS, UCOM, BILUA,  in the last 72 hours  MICROBIOLOGY: Recent Results (from the past 240 hour(s))  CULTURE, BLOOD (ROUTINE X 2)     Status: None   Collection Time    08/21/2013 10:28 PM      Result Value Ref Range Status   Specimen Description BLOOD WRIST LEFT   Final   Special Requests BOTTLES DRAWN AEROBIC AND ANAEROBIC 10CC   Final   Culture  Setup Time     Final   Value: 08/05/2013 12:07     Performed at [facility]   Culture     Final   Value: KLEBSIELLA PNEUMONIAE     Note: Confirmed Extended Spectrum Beta-Lactamase Producer (ESBL)     Note: Gram Stain Report Called to,Read Back By and Verified With: CHRIS HAYES 08/05/13 @ 9:31PM BY RUSCOE A. PREVIOUSLY REPORTED AS GRAM POSITIVE RODS CORRECTED RESULTS CALLED TO: MELINDA MACASERO 08/06/13 @ 10AM BY RUSCOE A. CRITICAL RESULT CALLED TO,      READ BACK BY AND VERIFIED WITH: Madlyn Frankel 08/06/13 @ 8:33PM BY RUSCOE A.     Performed at [facility]   Report Status 08/06/2013 FINAL   Final   Organism ID, Bacteria KLEBSIELLA PNEUMONIAE   Final  CULTURE, BLOOD (ROUTINE X 2)     Status: None   Collection Time    07/30/2013 10:36 PM      Result Value Ref Range Status   Specimen Description BLOOD HAND RIGHT   Final   Special Requests BOTTLES DRAWN AEROBIC AND ANAEROBIC 6CC  Final   Culture  Setup Time     Final   Value: 08/05/2013 12:07     Performed at [facility]   Culture     Final   Value:        BLOOD CULTURE RECEIVED NO GROWTH TO DATE CULTURE WILL BE HELD FOR 5 DAYS BEFORE ISSUING A FINAL NEGATIVE REPORT     Performed at [facility]   Report Status PENDING   Incomplete  URINE CULTURE     Status: None   Collection Time    08/22/2013 10:48 PM      Result Value Ref Range Status   Specimen Description URINE, CATHETERIZED   Final   Special Requests NONE   Final   Culture  Setup Time     Final   Value: 08/05/2013 12:50     Performed at [place]     Final   Value:  >=100,000 COLONIES/ML     Performed at [facility]   Culture     Final   Value: KLEBSIELLA PNEUMONIAE     Performed at [facility]   Report Status 08/07/2013 FINAL   Final   Organism ID, Bacteria KLEBSIELLA PNEUMONIAE   Final  MRSA PCR SCREENING     Status: None   Collection Time    08/05/13  2:28 AM      Result Value Ref Range Status   MRSA by PCR NEGATIVE  NEGATIVE Final   Comment:            The GeneXpert MRSA Assay (FDA     approved for NASAL specimens     only), is one component of a     comprehensive MRSA colonization     surveillance program. It is not     intended to diagnose MRSA     infection nor to guide or     monitor treatment for     MRSA infections.  CULTURE, BLOOD (ROUTINE X 2)     Status: None   Collection Time    08/08/13  8:01 AM      Result Value Ref Range Status   Specimen Description BLOOD LEFT HAND   Final   Special Requests BOTTLES DRAWN AEROBIC ONLY 10CC   Final   Culture  Setup Time     Final   Value: 08/08/2013 16:39     Performed at [facility]   Culture     Final   Value:        BLOOD CULTURE RECEIVED NO GROWTH TO DATE CULTURE WILL BE HELD FOR 5 DAYS BEFORE ISSUING A FINAL NEGATIVE REPORT     Performed at [facility]   Report Status PENDING   Incomplete  CULTURE, BLOOD (ROUTINE X 2)     Status: None   Collection Time    08/08/13  8:16 AM      Result Value Ref Range Status   Specimen Description BLOOD LEFT HAND   Final   Special Requests BOTTLES DRAWN AEROBIC ONLY 10CC   Final   Culture  Setup Time     Final   Value: 08/08/2013 16:39     Performed at [facility]   Culture     Final   Value:        BLOOD CULTURE RECEIVED NO GROWTH TO DATE CULTURE WILL BE HELD FOR 5 DAYS BEFORE ISSUING A FINAL NEGATIVE REPORT     Performed at [facility]   Report Status PENDING   Incomplete    RADIOLOGY STUDIES/RESULTS: US Renal  08/02/2013   CLINICAL DATA:  Evaluate hydronephrosis.  EXAM: RENAL/URINARY  TRACT ULTRASOUND COMPLETE  COMPARISON:  01/31/2013  FINDINGS: Right Kidney:  Length: 11.1 cm. Echogenicity within normal limits. Moderate to severe hydronephrosis. Similar to previous exam.  Left Kidney:  Length: 11.2 cm. Echogenicity within normal limits. Moderate hydronephrosis. Similar to previous exam.  Bladder:  Ureteral jets not visualized. The pre void bladder volume is equal to 92 cc. The postvoid bladder volume is equal to 54 cc.  IMPRESSION: 1. Persistent moderate to severe bilateral hydronephrosis.   Electronically Signed   By: Kerby Moors M.D.   On: 08/02/2013 13:48   US Renal Port  08/06/2013   CLINICAL DATA:  Evaluate for hydronephrosis.  EXAM: RENAL/URINARY TRACT ULTRASOUND COMPLETE  COMPARISON:  Ultrasound 08/02/2013.  FINDINGS: Right Kidney:  Length: 12.5 cm. Echogenicity within normal limits. Persistent but improved hydronephrosis.  Left Kidney:  Length: 11.9 cm. Echogenicity within normal limits. Persistent but improved hydronephrosis.  Bladder:  Bladder nondistended.  Foley catheter in bladder.  IMPRESSION: 1. Foley catheter bladder appear bladder nondistended. 2. Persistent but improved bilateral hydronephrosis.   Electronically Signed   By: Marcello Moores  Register   On: 08/06/2013 16:12   Dg Chest Port 1 View  08/06/2013   CLINICAL DATA:  Shortness of Breath  EXAM: PORTABLE CHEST - 1 VIEW  COMPARISON:  August 05, 2013  FINDINGS: There is patchy infiltrate in the left base. Elsewhere lungs are clear. Heart size and pulmonary vascularity are normal. No adenopathy. No bone lesions.  IMPRESSION: Left base infiltrate.   Electronically Signed   By: Lowella Grip M.D.   On: 08/06/2013 22:37   Dg Chest Portable 1 View  08/05/2013   CLINICAL DATA:  Hypotension.  EXAM: PORTABLE CHEST - 1 VIEW  COMPARISON:  08/19/2013  FINDINGS: Shallow inspiration. Cardiac enlargement without vascular congestion. Mild diffuse interstitial prominence again demonstrated. No evidence of any increase consolidation or  infiltration since prior study. No blunting of costophrenic angles. No pneumothorax.  IMPRESSION: Shallow inspiration. Cardiac enlargement with interstitial changes. No progression since prior study.   Electronically Signed   By: Lucienne Capers M.D.   On: 08/05/2013 01:16   Dg Chest Port 1 View  07/28/2013   CLINICAL DATA:  Hypotension.  EXAM: PORTABLE CHEST - 1 VIEW  COMPARISON:  Chest radiograph January 13, 2012  FINDINGS: The cardiac silhouette appears similarly enlarged, mediastinal silhouette is nonsuspicious, patient is rotated to the right. Mild diffuse interstitial prominence, somewhat confluent in the left lung base. Low inspiratory examination. No pleural effusions. No pneumothorax.  Osteopenia. Mild degenerative change of the thoracic spine. Multiple EKG lines overlie the patient and may obscure subtle underlying pathology.  IMPRESSION: Similar cardiomegaly, and diffuse mild interstitial prominence could reflect pulmonary edema/ atypical infection somewhat confluent left lung base.   Electronically Signed   By: Elon Alas   On: 08/06/2013 23:13    Thurnell Lose M.D on 08/11/2013 at 9:52 AM  Between 7am to 7pm - Pager - [phone], After 7pm go to [email or web address] - password [email or web address]  And look for the night coverage person covering me after hours  [place]   LOS: 7 days   **Disclaimer: This note may have been dictated with voice recognition software. Similar sounding words can inadvertently be transcribed and this note may contain transcription errors which may not have been corrected upon publication  of note.**

## 2013-08-12 LAB — BASIC METABOLIC PANEL
Anion gap: 8 (ref 5–15)
BUN: 9 mg/dL (ref 6–23)
CHLORIDE: 117 meq/L — AB (ref 96–112)
CO2: 33 mEq/L — ABNORMAL HIGH (ref 19–32)
CREATININE: 0.65 mg/dL (ref 0.50–1.10)
Calcium: 8 mg/dL — ABNORMAL LOW (ref 8.4–10.5)
GFR calc non Af Amer: 87 mL/min — ABNORMAL LOW (ref >90)
GLUCOSE: 127 mg/dL — AB (ref 70–99)
Potassium: 3.5 mEq/L — ABNORMAL LOW (ref 3.7–5.3)
Sodium: 158 mEq/L — ABNORMAL HIGH (ref 137–147)

## 2013-08-12 LAB — CBC
HEMATOCRIT: 39.8 % (ref 36.0–46.0)
HEMOGLOBIN: 11.7 g/dL — AB (ref 12.0–15.0)
MCH: 29.5 pg (ref 26.0–34.0)
MCHC: 29.4 g/dL — ABNORMAL LOW (ref 30.0–36.0)
MCV: 100.5 fL — AB (ref 78.0–100.0)
Platelets: 390 10*3/uL (ref 150–400)
RBC: 3.96 MIL/uL (ref 3.87–5.11)
RDW: 14.3 % (ref 11.5–15.5)
WBC: 29.3 10*3/uL — AB (ref 4.0–10.5)

## 2013-08-12 LAB — MAGNESIUM: Magnesium: 2.2 mg/dL (ref 1.5–2.5)

## 2013-08-12 MED ORDER — LORAZEPAM 2 MG/ML IJ SOLN: 2.0000 mg | INTRAMUSCULAR | Status: DC | PRN

## 2013-08-12 MED ORDER — MORPHINE SULFATE 4 MG/ML IJ SOLN
INTRAMUSCULAR | Status: AC
  Administered 2013-08-12: 07:00:00
  Filled 2013-08-12: qty 1

## 2013-08-12 MED ORDER — ONDANSETRON HCL 4 MG PO TABS: 4.0000 mg | ORAL_TABLET | Freq: Four times a day (QID) | ORAL | Status: DC | PRN

## 2013-08-12 MED ORDER — POTASSIUM CHLORIDE 10 MEQ/100ML IV SOLN
10.0000 meq | INTRAVENOUS | Status: DC
  Administered 2013-08-12: 10 meq via INTRAVENOUS
  Filled 2013-08-12: qty 100

## 2013-08-12 MED ORDER — BACLOFEN 10 MG PO TABS: 5.0000 mg | ORAL_TABLET | Freq: Two times a day (BID) | ORAL | Status: DC | PRN

## 2013-08-12 MED ORDER — BISACODYL 10 MG RE SUPP: 10.0000 mg | Freq: Every day | RECTAL | Status: DC | PRN

## 2013-08-12 MED ORDER — LORAZEPAM 2 MG/ML IJ SOLN: 2.0000 mg | INTRAMUSCULAR | Status: DC

## 2013-08-12 MED ORDER — SCOPOLAMINE 1 MG/3DAYS TD PT72
1.0000 | MEDICATED_PATCH | TRANSDERMAL | Status: DC
  Administered 2013-08-12: 1.5 mg via TRANSDERMAL
  Filled 2013-08-12: qty 1

## 2013-08-12 MED ORDER — POTASSIUM CHLORIDE 10 MEQ/100ML IV SOLN: 10.0000 meq | INTRAVENOUS | Status: DC

## 2013-08-12 MED ORDER — MORPHINE SULFATE 4 MG/ML IJ SOLN: 4.0000 mg | Freq: Once | INTRAMUSCULAR | Status: DC

## 2013-08-12 MED ORDER — ALBUTEROL SULFATE (2.5 MG/3ML) 0.083% IN NEBU
INHALATION_SOLUTION | RESPIRATORY_TRACT | Status: AC
  Filled 2013-08-12: qty 3

## 2013-08-12 MED ORDER — LORAZEPAM 2 MG/ML IJ SOLN
INTRAMUSCULAR | Status: AC
  Administered 2013-08-12: 1 mg
  Filled 2013-08-12: qty 1

## 2013-08-12 MED ORDER — POLYVINYL ALCOHOL 1.4 % OP SOLN: 1.0000 [drp] | Freq: Four times a day (QID) | OPHTHALMIC | Status: DC | PRN

## 2013-08-12 MED ORDER — ONDANSETRON HCL 4 MG/2ML IJ SOLN: 4.0000 mg | Freq: Four times a day (QID) | INTRAMUSCULAR | Status: DC | PRN

## 2013-08-12 MED ORDER — MORPHINE SULFATE 4 MG/ML IJ SOLN
4.0000 mg | Freq: Once | INTRAMUSCULAR | Status: AC
  Administered 2013-08-12: 4 mg via INTRAVENOUS

## 2013-08-12 MED ORDER — LORAZEPAM 2 MG/ML IJ SOLN
1.0000 mg | Freq: Once | INTRAMUSCULAR | Status: AC
  Administered 2013-08-12: 1 mg via INTRAVENOUS

## 2013-08-12 MED ORDER — POTASSIUM CHLORIDE CRYS ER 20 MEQ PO TBCR: 40.0000 meq | EXTENDED_RELEASE_TABLET | Freq: Once | ORAL | Status: DC

## 2013-08-12 MED ORDER — ATROPINE SULFATE 1 % OP SOLN: 4.0000 [drp] | OPHTHALMIC | Status: DC | PRN

## 2013-08-12 MED ORDER — SODIUM CHLORIDE 0.9 % IV SOLN
5.0000 mg/h | INTRAVENOUS | Status: DC
  Administered 2013-08-12: 5 mg/h via INTRAVENOUS
  Filled 2013-08-12: qty 10

## 2013-08-12 MED ORDER — ALBUTEROL SULFATE (2.5 MG/3ML) 0.083% IN NEBU
2.5000 mg | INHALATION_SOLUTION | Freq: Four times a day (QID) | RESPIRATORY_TRACT | Status: DC | PRN
  Administered 2013-08-12: 2.5 mg via RESPIRATORY_TRACT

## 2013-08-14 LAB — CULTURE, BLOOD (ROUTINE X 2)
Culture: NO GROWTH
Culture: NO GROWTH

## 2013-08-14 NOTE — Progress Notes (Signed)
   CSW reviewed chart and Pt passed on 08-22-2013. CSW contacted Bard Herbert from [facility] to update on Pt status. Ms. Lynnette Caffey was notified by family.    [facility]  [address];  [phone] Phone: [phone]

## 2013-08-15 MED FILL — Medication: Qty: 1 | Status: AC

## 2013-08-16 ENCOUNTER — Telehealth: Payer: Self-pay | Admitting: Family Medicine

## 2013-08-16 NOTE — Telephone Encounter (Signed)
Pt family member is calling to let md know pt had expired

## 2013-08-26 NOTE — Progress Notes (Signed)
Patient stopped breathing.  No pulse appreciated.  No blood pressure.  MD made aware.  Nurse may pronounce death.  Emotional support given to family.

## 2013-08-26 NOTE — Progress Notes (Signed)
Wasted 95ml of morphine drip to sink witnessed by [place], Therapist, sports

## 2013-08-26 NOTE — Code Documentation (Signed)
  Patient Name: Katherine Ware   MRN: [id number]   Date of Birth/ Sex: 11-11-1940 , female      Admission Date: 08/24/2013  Attending Provider: Thurnell Lose, MD  Primary Diagnosis: Infection due to ESBL-producing Klebsiella pneumoniae   Indication: Pt was in her usual state of health until this AM, when she was noted to be agonal breathing and desaturating. Code blue was subsequently called. At the time of arrival on scene, ACLS protocol was underway. Pt is limited code and therefore CPR and intubation were not performed.    Technical Description:  - CPR performance duration:  0  minute  - Was defibrillation or cardioversion used? No   - Was external pacer placed? No  - Was patient intubated pre/post CPR? No   Medications Administered: Y = Yes; Blank = No Amiodarone    Atropine    Calcium    Epinephrine    Lidocaine    Magnesium    Norepinephrine    Phenylephrine    Sodium bicarbonate    Vasopressin     Post CPR evaluation:  - Final Status - Was patient successfully resuscitated ? Yes - What is current rhythm? SB - What is current hemodynamic status? perfusing  Miscellaneous Information:  - Labs sent, including: ABG  - Primary team notified?  Yes  - Family Notified? Yes  - Additional notes/ transfer status: Primary physician notified family that decided to stop aggressive resuscitation. Bipap was going to be attempted but pt became too altered. Pt was given 4mg  morphine and 1 mg ativan for comfort and family was present. Primary team resumed care.      Clinton Gallant, MD  08-23-2013, 7:33 AM

## 2013-08-26 NOTE — Discharge Summary (Signed)
[facility] Death Note                                                                                          Death Note please see Last Note for all details.   In breif -    Katherine Ware OMV:[id number]:[id number] is a 73 y.o. female, Outpatient Primary MD for the patient is Laurey Morale, MD  Pronounced dead by RN on 2001/07/225  @   12pm              Cause of death Aspiration Pneumonia, UTI, Bacteremia, Sepsis   Thurnell Lose M.D on 08/16/13 at 1:36 PM  [facility] Office Phone [phone]  Total clinical and documentation time for today - 30 minutes     Last Note            PATIENT DETAILS Name: Katherine Ware Age: 73 y.o. 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 Blood culture positive for ESBL Klebsiella.     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 Focus of careful comfort care. Will be placed on morphine drip.   Subjective:  In bed, remains nonverbal but appears to be in mild SOB, appears extremely frail and deconditioned.Son in room for assistance. Eating dysphagia diet 1 with assistance.     Assessment/Plan:  Septic  Shock: ESBL Klebsiella Bacteremia from UTI: -secondary to ESBL bacteremia and UTI, switched to Primaxin on 08/07/2013. Will require total of 14 days. Has a PICC line. Most likely SNF placement per family.     Bedbound status with severe generalized weakness and deconditioning, dysphagia secondary to underlying progressive supranuclear palsy and parkinsonian syndrome and lacunar strokes.  She is on dysphagia 1 diet with nectar thick liquids, remains very high risk for aspiration, family made aware, I personally think clinically she is having microaspiration of oral secretions and her diet, we'll continue to provide supportive care, and pulmonary toiletry, she is DNR/DNI.  Had acute respiratory failure in the morning of 08/16/13 after which detailed discussion with family was made and she was turned to full DO NOT RESUSCITATE. Focus of careful comfort care. Will be placed on morphine drip with comfort medications. Likely will pass away soon.     ARF: - Resolved, continue to monitor.  Metabolic Acidosis due to septic shock - Resolved with tx.    Acute Encephalopathy: - Resolved with tx. Patient at mental baseline.     H/o Progressive supranuclear palsy and parkinsonian syndrome , H/o lacunar strokes  -Due to recurrent aspiration holding home medications- Sinemet, baclofen, effexor along with Plavix and aspirin.      Hypokalemia  Replaced IV      H/o neurogenic bladder and bilateral hydronephrosis  -Does in and out catheterization at home with assistance, now focus of care Discomfort Will replace Foley on Aug 17, 2013.    GERD  Total care      Disposition: Remain inpatient. Anticipate D/C 1-2 days to SNF with comfort care if survives    DVT Prophylaxis: Prophylactic Lovenox    Code Status: No CPR or intubation.    Family Communication Husband and Son at bedside. Understands current circumstances and plan including possible ongoing microaspiration  poor prognosis, want to focus on comfort in this time  Procedures:  None  CONSULTS:  None  MEDICATIONS: Scheduled Meds: . antiseptic oral rinse  15 mL Mouth Rinse BID  . pantoprazole  40 mg Oral Daily  . scopolamine  1 patch Transdermal Q72H   Continuous Infusions: . morphine 5 mg/hr (08/17/13 0849)   PRN Meds:.sodium chloride, albuterol, atropine, baclofen, bisacodyl, LORazepam, ondansetron (ZOFRAN) IV, ondansetron, polyvinyl alcohol, sodium chloride  Antibiotics: Anti-infectives   Start     Dose/Rate Route Frequency Ordered Stop   08/07/13 0730  imipenem-cilastatin (PRIMAXIN) 500 mg in sodium chloride 0.9 % 100 mL IVPB  Status:  Discontinued     500 mg 200 mL/hr over 30 Minutes Intravenous 3 times per day 08/07/13 0726 Aug 17, 2013 0735   08/05/13 2200  vancomycin (VANCOCIN) IVPB 1000 mg/200 mL premix  Status:  Discontinued     1,000 mg 200 mL/hr over 60 Minutes Intravenous Every 24 hours 08/05/13 0141 08/06/13 1415   08/05/13 0600  piperacillin-tazobactam (ZOSYN) IVPB 3.375 g  Status:  Discontinued     3.375 g 12.5 mL/hr over 240 Minutes Intravenous 3 times per day 08/05/13 0141 08/07/13 0717   08/05/2013 2230  vancomycin (VANCOCIN) 1,500 mg in sodium chloride 0.9 % 500 mL IVPB     1,500 mg 250 mL/hr over 120 Minutes Intravenous  Once 08/23/2013 2216 08/11/13 0642   08/03/2013 2230  piperacillin-tazobactam (ZOSYN) IVPB 3.375 g     3.375 g 100 mL/hr over 30 Minutes Intravenous  Once 07/29/2013 2216 08/13/2013 2308       PHYSICAL EXAM: Vital signs in last 24 hours: Filed Vitals:   2013-08-17 0130 08/17/2013 0650 Aug 17, 2013 0721 08-17-13 1200  BP:  119/49    Pulse:  122    Temp:  98.2 F (36.8 C)    TempSrc:  Oral    Resp:  26 22   Height:      Weight:    85.1 kg (187 lb 9.8 oz)  SpO2: 94% 66% 87%     Weight change:  Filed Weights   08/05/13 0230 08/06/13 0500 August 17, 2013 1200  Weight: 84.5 kg (186 lb 4.6 oz) 85.1 kg (187 lb 9.8 oz) 85.1 kg (187 lb 9.8 oz)   Body mass index is  35.47 kg/(m^2).   Gen Exam: Somnolent and short of breath in bed unresponsive  Neck: Supple, No JVD.   Chest: Bibasilar coarse breath sounds.   CVS: S1 S2 Regular, no murmurs.  Abdomen: soft, BS +, non tender, non distended.  Extremities: no edema, lower extremities warm to touch. Able to move  feet. Neurologic: At baseline. Skin: No Rash.   Wounds: N/A.    Intake/Output from previous day:  Intake/Output Summary (Last 24 hours) at 08-31-2013 1336 Last data filed at 08/11/13 1505  Gross per 24 hour  Intake    560 ml  Output      0 ml  Net    560 ml     LAB RESULTS: CBC  Recent Labs Lab 08/06/13 0440 08/07/13 0525 08/08/13 1057 2013/08/31 0500  WBC 31.3* 24.9* 11.9* 29.3*  HGB 10.0* 9.8* 10.6* 11.7*  HCT 30.3* 30.6* 32.6* 39.8  PLT 211 195 218 390  MCV 93.2 93.0 92.6 100.5*  MCH 30.8 29.8 30.1 29.5  MCHC 33.0 32.0 32.5 29.4*  RDW 14.0 14.2 14.1 14.3    Chemistries   Recent Labs Lab 08/06/13 0440 08/07/13 0525 08/08/13 0500 08/09/13 0600 08/10/13 0545 08/10/13 1415 08-31-2013 0500  NA 141 143  --   --   --   --  158*  K 3.4* 3.5*  --   --  2.7* 3.3* 3.5*  CL 107 109  --   --   --   --  117*  CO2 18* 20  --   --   --   --  33*  GLUCOSE 168* 109*  --   --   --   --  127*  BUN 16 12  --   --   --   --  9  CREATININE 1.12* 0.91  --   --   --   --  0.65  CALCIUM 7.1* 7.7*  --   --   --   --  8.0*  MG  --  1.5 1.5 1.7 1.7  --  2.2    CBG:  Recent Labs Lab 08/05/13 1954 08/06/13 0025 08/06/13 0443 08/06/13 0733 08/06/13 1125  GLUCAP 125* 173* 179* 153* 150*    GFR Estimated Creatinine Clearance: 63.8 ml/min (by C-G formula based on Cr of 0.65).  Coagulation profile No results found for this basename: INR, PROTIME,  in the last 168 hours  Cardiac Enzymes No results found for this basename: CK, CKMB, TROPONINI, MYOGLOBIN,  in the last 168 hours  No components found with this basename: POCBNP,  No results found for this basename: DDIMER,  in the last  72 hours No results found for this basename: HGBA1C,  in the last 72 hours No results found for this basename: CHOL, HDL, LDLCALC, TRIG, CHOLHDL, LDLDIRECT,  in the last 72 hours No results found for this basename: TSH, T4TOTAL, FREET3, T3FREE, THYROIDAB,  in the last 72 hours No results found for this basename: VITAMINB12, FOLATE, FERRITIN, TIBC, IRON, RETICCTPCT,  in the last 72 hours No results found for this basename: LIPASE, AMYLASE,  in the last 72 hours  Urine Studies No results found for this basename: UACOL, UAPR, USPG, UPH, UTP, UGL, UKET, UBIL, UHGB, UNIT, UROB, ULEU, UEPI, UWBC, URBC, UBAC, CAST, CRYS, UCOM, BILUA,  in the last 72 hours  MICROBIOLOGY: Recent Results (from the past 240 hour(s))  CULTURE, BLOOD (ROUTINE X 2)     Status: None   Collection Time    07/30/2013 10:28 PM      Result Value Ref Range Status   Specimen Description BLOOD WRIST LEFT   Final   Special Requests BOTTLES DRAWN AEROBIC AND ANAEROBIC 10CC   Final   Culture  Setup Time     Final   Value: 08/05/2013 12:07     Performed at [facility]   Culture     Final   Value: KLEBSIELLA PNEUMONIAE     Note: Confirmed Extended Spectrum Beta-Lactamase Producer (ESBL)     Note: Gram Stain Report Called to,Read Back By and Verified With: CHRIS HAYES 08/05/13 @ 9:31PM BY RUSCOE A. PREVIOUSLY REPORTED AS GRAM POSITIVE RODS CORRECTED RESULTS CALLED TO: MELINDA MACASERO 08/06/13 @ 10AM BY RUSCOE A. CRITICAL RESULT CALLED TO,      READ BACK BY AND VERIFIED WITH: Madlyn Frankel 08/06/13 @ 8:33PM BY RUSCOE A.     Performed at [facility]   Report Status 08/06/2013 FINAL   Final   Organism ID, Bacteria KLEBSIELLA PNEUMONIAE   Final  CULTURE, BLOOD (ROUTINE X 2)     Status: None   Collection Time    07/26/2013 10:36 PM      Result Value Ref Range Status   Specimen Description BLOOD HAND RIGHT   Final   Special Requests BOTTLES DRAWN AEROBIC AND ANAEROBIC [facility]   Final   Culture  Setup Time     Final    Value: 08/05/2013 12:07     Performed at [facility]   Culture     Final   Value: NO GROWTH 5 DAYS     Performed at [facility]   Report Status 08/11/2013 FINAL   Final  URINE CULTURE     Status: None   Collection Time    08/10/2013 10:48 PM      Result Value Ref Range Status   Specimen Description URINE, CATHETERIZED   Final   Special Requests NONE   Final   Culture  Setup Time     Final   Value: 08/05/2013 12:50     Performed at [place]     Final   Value: >=100,000 COLONIES/ML     Performed at [facility]   Culture     Final   Value: KLEBSIELLA PNEUMONIAE     Performed at [facility]   Report Status 08/07/2013 FINAL   Final   Organism ID, Bacteria KLEBSIELLA PNEUMONIAE   Final  MRSA PCR SCREENING     Status: None   Collection Time    08/05/13  2:28 AM      Result Value Ref Range Status   MRSA by PCR NEGATIVE  NEGATIVE Final   Comment:            The GeneXpert MRSA Assay (FDA     approved for NASAL specimens     only), is one component of a     comprehensive MRSA colonization     surveillance program. It is not     intended to diagnose MRSA     infection nor to guide or     monitor treatment for     MRSA infections.  CULTURE, BLOOD (ROUTINE X 2)     Status: None   Collection Time    08/08/13  8:01 AM      Result Value Ref Range Status   Specimen Description BLOOD LEFT HAND   Final   Special Requests BOTTLES DRAWN AEROBIC ONLY 10CC   Final   Culture  Setup Time     Final   Value: 08/08/2013 16:39     Performed at [facility]   Culture     Final   Value:        BLOOD CULTURE RECEIVED NO GROWTH TO DATE CULTURE WILL BE HELD FOR 5  DAYS BEFORE ISSUING A FINAL NEGATIVE REPORT     Performed at [facility]   Report Status PENDING   Incomplete  CULTURE, BLOOD (ROUTINE X 2)     Status: None   Collection Time    08/08/13  8:16 AM      Result Value Ref Range Status   Specimen Description BLOOD  LEFT HAND   Final   Special Requests BOTTLES DRAWN AEROBIC ONLY 10CC   Final   Culture  Setup Time     Final   Value: 08/08/2013 16:39     Performed at [facility]   Culture     Final   Value:        BLOOD CULTURE RECEIVED NO GROWTH TO DATE CULTURE WILL BE HELD FOR 5 DAYS BEFORE ISSUING A FINAL NEGATIVE REPORT     Performed at [facility]   Report Status PENDING   Incomplete    RADIOLOGY STUDIES/RESULTS: US Renal  08/02/2013   CLINICAL DATA:  Evaluate hydronephrosis.  EXAM: RENAL/URINARY TRACT ULTRASOUND COMPLETE  COMPARISON:  01/31/2013  FINDINGS: Right Kidney:  Length: 11.1 cm. Echogenicity within normal limits. Moderate to severe hydronephrosis. Similar to previous exam.  Left Kidney:  Length: 11.2 cm. Echogenicity within normal limits. Moderate hydronephrosis. Similar to previous exam.  Bladder:  Ureteral jets not visualized. The pre void bladder volume is equal to 92 cc. The postvoid bladder volume is equal to 54 cc.  IMPRESSION: 1. Persistent moderate to severe bilateral hydronephrosis.   Electronically Signed   By: Kerby Moors M.D.   On: 08/02/2013 13:48   US Renal Port  08/06/2013   CLINICAL DATA:  Evaluate for hydronephrosis.  EXAM: RENAL/URINARY TRACT ULTRASOUND COMPLETE  COMPARISON:  Ultrasound 08/02/2013.  FINDINGS: Right Kidney:  Length: 12.5 cm. Echogenicity within normal limits. Persistent but improved hydronephrosis.  Left Kidney:  Length: 11.9 cm. Echogenicity within normal limits. Persistent but improved hydronephrosis.  Bladder:  Bladder nondistended.  Foley catheter in bladder.  IMPRESSION: 1. Foley catheter bladder appear bladder nondistended. 2. Persistent but improved bilateral hydronephrosis.   Electronically Signed   By: Marcello Moores  Register   On: 08/06/2013 16:12   Dg Chest Port 1 View  08/06/2013   CLINICAL DATA:  Shortness of Breath  EXAM: PORTABLE CHEST - 1 VIEW  COMPARISON:  August 05, 2013  FINDINGS: There is patchy infiltrate in the left base.  Elsewhere lungs are clear. Heart size and pulmonary vascularity are normal. No adenopathy. No bone lesions.  IMPRESSION: Left base infiltrate.   Electronically Signed   By: Lowella Grip M.D.   On: 08/06/2013 22:37   Dg Chest Portable 1 View  08/05/2013   CLINICAL DATA:  Hypotension.  EXAM: PORTABLE CHEST - 1 VIEW  COMPARISON:  08/23/2013  FINDINGS: Shallow inspiration. Cardiac enlargement without vascular congestion. Mild diffuse interstitial prominence again demonstrated. No evidence of any increase consolidation or infiltration since prior study. No blunting of costophrenic angles. No pneumothorax.  IMPRESSION: Shallow inspiration. Cardiac enlargement with interstitial changes. No progression since prior study.   Electronically Signed   By: Lucienne Capers M.D.   On: 08/05/2013 01:16   Dg Chest Port 1 View  07/26/2013   CLINICAL DATA:  Hypotension.  EXAM: PORTABLE CHEST - 1 VIEW  COMPARISON:  Chest radiograph January 13, 2012  FINDINGS: The cardiac silhouette appears similarly enlarged, mediastinal silhouette is nonsuspicious, patient is rotated to the right. Mild diffuse interstitial prominence, somewhat confluent in the left lung base. Low inspiratory examination.  No pleural effusions. No pneumothorax.  Osteopenia. Mild degenerative change of the thoracic spine. Multiple EKG lines overlie the patient and may obscure subtle underlying pathology.  IMPRESSION: Similar cardiomegaly, and diffuse mild interstitial prominence could reflect pulmonary edema/ atypical infection somewhat confluent left lung base.   Electronically Signed   By: Elon Alas   On: 08/15/2013 23:13    Thurnell Lose M.D on Sep 04, 2013 at 1:36 PM  Between 7am to 7pm - Pager - [phone], After 7pm go to [email or web address] - password [email or web address]  And look for the night coverage person covering me after hours  [place]   LOS: 8 days   **Disclaimer: This note may have been dictated with  voice recognition software. Similar sounding words can inadvertently be transcribed and this note may contain transcription errors which may not have been corrected upon publication of note.**

## 2013-08-26 NOTE — Progress Notes (Signed)
During am Vitals 0650, Mrs. Katherine Ware was found to have Sats of 65% on 5L  BP 119/50, 100 pulse.  Patient was placed on 100% NRB mask.   MD called and Code Blue called.  Limited code per orders  Meds only. Patient was placed on ambu bag  and respirations returned to 87% during code.  Patient maintained spontaneous pulse and BP during this time. At 0725 Code blue was stopped per MD and stablized on NRB mask. Code changed to DNR and comfort measure per family and MD.    Pt. Resting at this time.   Cyndia Diver, RN.

## 2013-08-26 NOTE — Progress Notes (Addendum)
[facility] notified about the death and patient is not suitable for any organ donation. Referral no. [id number]. Spoke with Murlean Caller.

## 2013-08-26 NOTE — Progress Notes (Signed)
PATIENT DETAILS Name: Katherine Ware Age: 73 y.o. Sex: female Date of Birth: 11-11-1940 Admit Date: 08/23/2013 Admitting Physician Colbert Coyer, MD PCP:FRY,STEPHEN A, MD   Brief Summary:  73 yo female with progressive supranuclear palsy and h/o frequent UTI and chronic bilateral hydronephrosis presenting with septic shock, likely urinary origin.Admitted by PCCM to ICU, briefly required Pressors, subsequently transferred to [facility] on 7/13. Blood culture positive for ESBL Klebsiella.     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 Focus of careful comfort care. Will be placed on morphine drip.   Subjective:  In bed, remains nonverbal but appears to be in mild SOB, appears extremely frail and deconditioned.Son in room for assistance. Eating dysphagia diet 1 with assistance.     Assessment/Plan:  Septic Shock: ESBL Klebsiella Bacteremia from UTI: -secondary to ESBL bacteremia and UTI, switched to Primaxin on 08/07/2013. Will require total of 14 days. Has a PICC line. Most likely SNF placement per family.     Bedbound status with severe generalized weakness and deconditioning, dysphagia secondary to underlying progressive supranuclear palsy and parkinsonian syndrome and lacunar strokes.  She is on dysphagia 1 diet with nectar thick liquids, remains very high risk for aspiration, family made aware, I personally think clinically she is having microaspiration of oral secretions and her diet, we'll continue to provide supportive care, and pulmonary toiletry, she is DNR/DNI.  Had  acute respiratory failure in the morning of 09-01-2013 after which detailed discussion with family was made and she was turned to full DO NOT RESUSCITATE. Focus of careful comfort care. Will be placed on morphine drip with comfort medications. Likely will pass away soon.     ARF: - Resolved, continue to monitor.    Metabolic Acidosis due to septic shock - Resolved with tx.    Acute Encephalopathy: - Resolved with tx. Patient at mental baseline.     H/o Progressive supranuclear palsy and parkinsonian syndrome , H/o lacunar strokes  -Due to recurrent aspiration holding home medications- Sinemet, baclofen, effexor along with Plavix and aspirin.      Hypokalemia  Replaced IV      H/o neurogenic bladder and bilateral hydronephrosis  -Does in and out catheterization at home with assistance, now focus of care Discomfort Will replace Foley on 09-01-2013.    GERD  Total care      Disposition: Remain inpatient. Anticipate D/C 1-2 days to SNF with comfort care if survives    DVT Prophylaxis: Prophylactic Lovenox    Code Status: No CPR or intubation.    Family Communication Husband and Son at bedside. Understands current circumstances and plan including possible ongoing microaspiration poor prognosis, want to focus on comfort in this time  Procedures:  None  CONSULTS:  None  MEDICATIONS: Scheduled Meds: . albuterol      . antiseptic oral rinse  15 mL Mouth Rinse BID  . pantoprazole  40 mg Oral Daily  . potassium chloride  10 mEq Intravenous Q1 Hr x 4  . scopolamine  1 patch Transdermal Q72H   Continuous Infusions: . morphine 5 mg/hr (09/01/13 0849)   PRN Meds:.sodium chloride, albuterol, atropine, baclofen, bisacodyl, LORazepam,  ondansetron (ZOFRAN) IV, ondansetron, polyvinyl alcohol, sodium chloride  Antibiotics: Anti-infectives   Start     Dose/Rate Route Frequency Ordered Stop   08/07/13 0730  imipenem-cilastatin (PRIMAXIN) 500 mg in sodium  chloride 0.9 % 100 mL IVPB  Status:  Discontinued     500 mg 200 mL/hr over 30 Minutes Intravenous 3 times per day 08/07/13 0726 08-13-2013 0735   08/05/13 2200  vancomycin (VANCOCIN) IVPB 1000 mg/200 mL premix  Status:  Discontinued     1,000 mg 200 mL/hr over 60 Minutes Intravenous Every 24 hours 08/05/13 0141 08/06/13 1415   08/05/13 0600  piperacillin-tazobactam (ZOSYN) IVPB 3.375 g  Status:  Discontinued     3.375 g 12.5 mL/hr over 240 Minutes Intravenous 3 times per day 08/05/13 0141 08/07/13 0717   08/07/2013 2230  vancomycin (VANCOCIN) 1,500 mg in sodium chloride 0.9 % 500 mL IVPB     1,500 mg 250 mL/hr over 120 Minutes Intravenous  Once 08/11/2013 2216 08/11/13 0642   08/11/2013 2230  piperacillin-tazobactam (ZOSYN) IVPB 3.375 g     3.375 g 100 mL/hr over 30 Minutes Intravenous  Once 08/10/2013 2216 08/23/2013 2308       PHYSICAL EXAM: Vital signs in last 24 hours: Filed Vitals:   08/11/13 2155 2013-08-13 0130 2013-08-13 0650 08/13/13 0721  BP: 155/77  119/49   Pulse: 100  122   Temp: 99 F (37.2 C)  98.2 F (36.8 C)   TempSrc: Oral  Oral   Resp: 22  26 22   Height:      Weight:      SpO2: 96% 94% 66% 87%    Weight change:  Filed Weights   08/05/13 0230 08/06/13 0500  Weight: 84.5 kg (186 lb 4.6 oz) 85.1 kg (187 lb 9.8 oz)   Body mass index is 35.47 kg/(m^2).   Gen Exam: Somnolent and short of breath in bed unresponsive  Neck: Supple, No JVD.   Chest: Bibasilar coarse breath sounds.   CVS: S1 S2 Regular, no murmurs.  Abdomen: soft, BS +, non tender, non distended.  Extremities: no edema, lower extremities warm to touch. Able to move feet. Neurologic: At baseline. Skin: No Rash.   Wounds: N/A.    Intake/Output from previous day:  Intake/Output Summary (Last 24 hours) at 08-13-2013 0955 Last data filed at 08/11/13 1505  Gross per 24 hour  Intake    655 ml  Output      0 ml  Net    655 ml     LAB RESULTS: CBC  Recent Labs Lab 08/06/13 0440 08/07/13 0525  08/08/13 1057 13-Aug-2013 0500  WBC 31.3* 24.9* 11.9* 29.3*  HGB 10.0* 9.8* 10.6* 11.7*  HCT 30.3* 30.6* 32.6* 39.8  PLT 211 195 218 390  MCV 93.2 93.0 92.6 100.5*  MCH 30.8 29.8 30.1 29.5  MCHC 33.0 32.0 32.5 29.4*  RDW 14.0 14.2 14.1 14.3    Chemistries   Recent Labs Lab 08/06/13 0440 08/07/13 0525 08/08/13 0500 08/09/13 0600 08/10/13 0545 08/10/13 1415 August 13, 2013 0500  NA 141 143  --   --   --   --  158*  K 3.4* 3.5*  --   --  2.7* 3.3* 3.5*  CL 107 109  --   --   --   --  117*  CO2 18* 20  --   --   --   --  33*  GLUCOSE 168* 109*  --   --   --   --  127*  BUN 16 12  --   --   --   --  9  CREATININE 1.12* 0.91  --   --   --   --  0.65  CALCIUM 7.1* 7.7*  --   --   --   --  8.0*  MG  --  1.5 1.5 1.7 1.7  --  2.2    CBG:  Recent Labs Lab 08/05/13 1954 08/06/13 0025 08/06/13 0443 08/06/13 0733 08/06/13 1125  GLUCAP 125* 173* 179* 153* 150*    GFR Estimated Creatinine Clearance: 63.8 ml/min (by C-G formula based on Cr of 0.65).  Coagulation profile No results found for this basename: INR, PROTIME,  in the last 168 hours  Cardiac Enzymes No results found for this basename: CK, CKMB, TROPONINI, MYOGLOBIN,  in the last 168 hours  No components found with this basename: POCBNP,  No results found for this basename: DDIMER,  in the last 72 hours No results found for this basename: HGBA1C,  in the last 72 hours No results found for this basename: CHOL, HDL, LDLCALC, TRIG, CHOLHDL, LDLDIRECT,  in the last 72 hours No results found for this basename: TSH, T4TOTAL, FREET3, T3FREE, THYROIDAB,  in the last 72 hours No results found for this basename: VITAMINB12, FOLATE, FERRITIN, TIBC, IRON, RETICCTPCT,  in the last 72 hours No results found for this basename: LIPASE, AMYLASE,  in the last 72 hours  Urine Studies No results found for this basename: UACOL, UAPR, USPG, UPH, UTP, UGL, UKET, UBIL, UHGB, UNIT, UROB, ULEU, UEPI, UWBC, URBC, UBAC, CAST, CRYS, UCOM, BILUA,  in  the last 72 hours  MICROBIOLOGY: Recent Results (from the past 240 hour(s))  CULTURE, BLOOD (ROUTINE X 2)     Status: None   Collection Time    08/08/2013 10:28 PM      Result Value Ref Range Status   Specimen Description BLOOD WRIST LEFT   Final   Special Requests BOTTLES DRAWN AEROBIC AND ANAEROBIC 10CC   Final   Culture  Setup Time     Final   Value: 08/05/2013 12:07     Performed at [facility]   Culture     Final   Value: KLEBSIELLA PNEUMONIAE     Note: Confirmed Extended Spectrum Beta-Lactamase Producer (ESBL)     Note: Gram Stain Report Called to,Read Back By and Verified With: CHRIS HAYES 08/05/13 @ 9:31PM BY RUSCOE A. PREVIOUSLY REPORTED AS GRAM POSITIVE RODS CORRECTED RESULTS CALLED TO: MELINDA MACASERO 08/06/13 @ 10AM BY RUSCOE A. CRITICAL RESULT CALLED TO,      READ BACK BY AND VERIFIED WITH: Madlyn Frankel 08/06/13 @ 8:33PM BY RUSCOE A.     Performed at [facility]   Report Status 08/06/2013 FINAL   Final   Organism ID, Bacteria KLEBSIELLA PNEUMONIAE   Final  CULTURE, BLOOD (ROUTINE X 2)     Status: None   Collection Time    08/23/2013 10:36 PM      Result Value Ref Range Status   Specimen Description BLOOD HAND RIGHT   Final   Special Requests BOTTLES DRAWN AEROBIC AND ANAEROBIC [facility]   Final   Culture  Setup Time     Final   Value: 08/05/2013 12:07     Performed at [facility]   Culture     Final   Value: NO GROWTH 5 DAYS     Performed at [facility]   Report Status 08/11/2013 FINAL   Final  URINE CULTURE  Status: None   Collection Time    08/25/2013 10:48 PM      Result Value Ref Range Status   Specimen Description URINE, CATHETERIZED   Final   Special Requests NONE   Final   Culture  Setup Time     Final   Value: 08/05/2013 12:50     Performed at [facility]     Final   Value: >=100,000 COLONIES/ML     Performed at [facility]   Culture     Final   Value: KLEBSIELLA PNEUMONIAE     Performed  at [facility]   Report Status 08/07/2013 FINAL   Final   Organism ID, Bacteria KLEBSIELLA PNEUMONIAE   Final  MRSA PCR SCREENING     Status: None   Collection Time    08/05/13  2:28 AM      Result Value Ref Range Status   MRSA by PCR NEGATIVE  NEGATIVE Final   Comment:            The GeneXpert MRSA Assay (FDA     approved for NASAL specimens     only), is one component of a     comprehensive MRSA colonization     surveillance program. It is not     intended to diagnose MRSA     infection nor to guide or     monitor treatment for     MRSA infections.  CULTURE, BLOOD (ROUTINE X 2)     Status: None   Collection Time    08/08/13  8:01 AM      Result Value Ref Range Status   Specimen Description BLOOD LEFT HAND   Final   Special Requests BOTTLES DRAWN AEROBIC ONLY 10CC   Final   Culture  Setup Time     Final   Value: 08/08/2013 16:39     Performed at [facility]   Culture     Final   Value:        BLOOD CULTURE RECEIVED NO GROWTH TO DATE CULTURE WILL BE HELD FOR 5 DAYS BEFORE ISSUING A FINAL NEGATIVE REPORT     Performed at [facility]   Report Status PENDING   Incomplete  CULTURE, BLOOD (ROUTINE X 2)     Status: None   Collection Time    08/08/13  8:16 AM      Result Value Ref Range Status   Specimen Description BLOOD LEFT HAND   Final   Special Requests BOTTLES DRAWN AEROBIC ONLY 10CC   Final   Culture  Setup Time     Final   Value: 08/08/2013 16:39     Performed at [facility]   Culture     Final   Value:        BLOOD CULTURE RECEIVED NO GROWTH TO DATE CULTURE WILL BE HELD FOR 5 DAYS BEFORE ISSUING A FINAL NEGATIVE REPORT     Performed at [facility]   Report Status PENDING   Incomplete    RADIOLOGY STUDIES/RESULTS: US Renal  08/02/2013   CLINICAL DATA:  Evaluate hydronephrosis.  EXAM: RENAL/URINARY TRACT ULTRASOUND COMPLETE  COMPARISON:  01/31/2013  FINDINGS: Right Kidney:  Length: 11.1 cm. Echogenicity within normal limits.  Moderate to severe hydronephrosis. Similar to previous exam.  Left Kidney:  Length: 11.2 cm. Echogenicity within normal limits. Moderate hydronephrosis. Similar to previous exam.  Bladder:  Ureteral jets not visualized. The pre void bladder volume is equal  to 92 cc. The postvoid bladder volume is equal to 54 cc.  IMPRESSION: 1. Persistent moderate to severe bilateral hydronephrosis.   Electronically Signed   By: Kerby Moors M.D.   On: 08/02/2013 13:48   US Renal Port  08/06/2013   CLINICAL DATA:  Evaluate for hydronephrosis.  EXAM: RENAL/URINARY TRACT ULTRASOUND COMPLETE  COMPARISON:  Ultrasound 08/02/2013.  FINDINGS: Right Kidney:  Length: 12.5 cm. Echogenicity within normal limits. Persistent but improved hydronephrosis.  Left Kidney:  Length: 11.9 cm. Echogenicity within normal limits. Persistent but improved hydronephrosis.  Bladder:  Bladder nondistended.  Foley catheter in bladder.  IMPRESSION: 1. Foley catheter bladder appear bladder nondistended. 2. Persistent but improved bilateral hydronephrosis.   Electronically Signed   By: Marcello Moores  Register   On: 08/06/2013 16:12   Dg Chest Port 1 View  08/06/2013   CLINICAL DATA:  Shortness of Breath  EXAM: PORTABLE CHEST - 1 VIEW  COMPARISON:  August 05, 2013  FINDINGS: There is patchy infiltrate in the left base. Elsewhere lungs are clear. Heart size and pulmonary vascularity are normal. No adenopathy. No bone lesions.  IMPRESSION: Left base infiltrate.   Electronically Signed   By: Lowella Grip M.D.   On: 08/06/2013 22:37   Dg Chest Portable 1 View  08/05/2013   CLINICAL DATA:  Hypotension.  EXAM: PORTABLE CHEST - 1 VIEW  COMPARISON:  08/11/2013  FINDINGS: Shallow inspiration. Cardiac enlargement without vascular congestion. Mild diffuse interstitial prominence again demonstrated. No evidence of any increase consolidation or infiltration since prior study. No blunting of costophrenic angles. No pneumothorax.  IMPRESSION: Shallow inspiration. Cardiac  enlargement with interstitial changes. No progression since prior study.   Electronically Signed   By: Lucienne Capers M.D.   On: 08/05/2013 01:16   Dg Chest Port 1 View  08/25/2013   CLINICAL DATA:  Hypotension.  EXAM: PORTABLE CHEST - 1 VIEW  COMPARISON:  Chest radiograph January 13, 2012  FINDINGS: The cardiac silhouette appears similarly enlarged, mediastinal silhouette is nonsuspicious, patient is rotated to the right. Mild diffuse interstitial prominence, somewhat confluent in the left lung base. Low inspiratory examination. No pleural effusions. No pneumothorax.  Osteopenia. Mild degenerative change of the thoracic spine. Multiple EKG lines overlie the patient and may obscure subtle underlying pathology.  IMPRESSION: Similar cardiomegaly, and diffuse mild interstitial prominence could reflect pulmonary edema/ atypical infection somewhat confluent left lung base.   Electronically Signed   By: Elon Alas   On: 08/15/2013 23:13    Thurnell Lose M.D on 08-27-13 at 9:55 AM  Between 7am to 7pm - Pager - [phone], After 7pm go to [email or web address] - password [email or web address]  And look for the night coverage person covering me after hours  [place]   LOS: 8 days   **Disclaimer: This note may have been dictated with voice recognition software. Similar sounding words can inadvertently be transcribed and this note may contain transcription errors which may not have been corrected upon publication of note.**

## 2013-08-26 NOTE — Progress Notes (Signed)
Patient being coded with partial code meds only, MD made aware of patients status. MD talked with the family and to stop resuscitation.

## 2013-08-26 NOTE — Progress Notes (Signed)
Notified Baltazar Najjar NP regarding patients rhonchorous and labored breathing. Breathing treatment was ordered and given.  Per AC's suggested Chest Xray and possible palliative tap suggested to NP.  NP was informed.  Patient has ongoing labored breathing.   Pt's vitals remain stable and 02 Sats were 94% on 4.5L.  Still minimum response from patient.    Cyndia Diver, RN.

## 2013-08-26 DEATH — deceased

## 2013-09-12 IMAGING — CR DG CHEST 2V
1 series · 1 of 1 positions shown · non-contrast
Comparison: 01/07/2012

CLINICAL DATA: Shortness of breath, weakness

CHEST - 2 VIEW

[view not recorded]
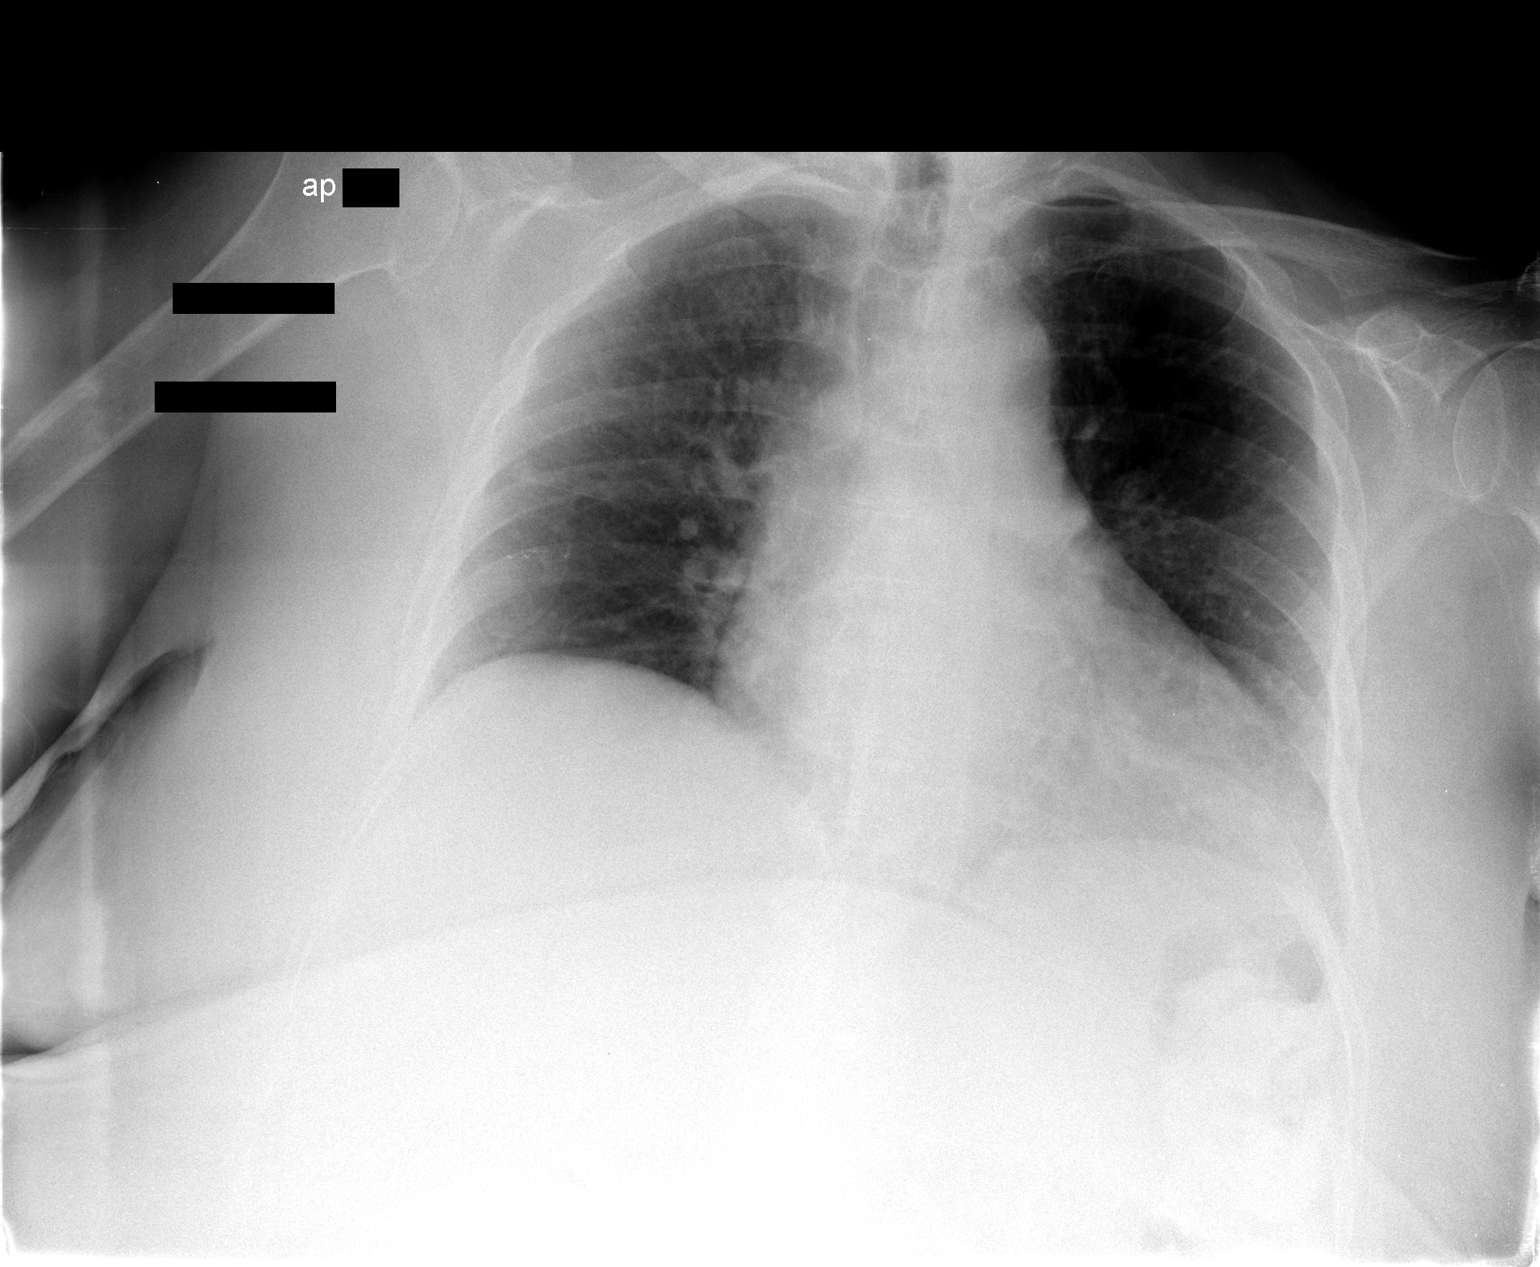

[1 of 1 positions shown; findings below may reference images not displayed]

FINDINGS: Cardiomegaly again noted.  Slight improvement in
aeration.  Residual mild interstitial prominence without convincing
pulmonary edema.  Elevation of the right hemidiaphragm again noted.
No segmental infiltrate.
IMPRESSION: No convincing pulmonary edema.  Cardiomegaly again noted.  No
segmental infiltrate.

## 2015-04-09 IMAGING — CR DG CHEST 1V PORT
1 series · 1 of 1 positions shown · non-contrast
Comparison: 08/06/2013

CLINICAL DATA: Short of breath.

EXAM:
PORTABLE CHEST - 1 VIEW

[AP]
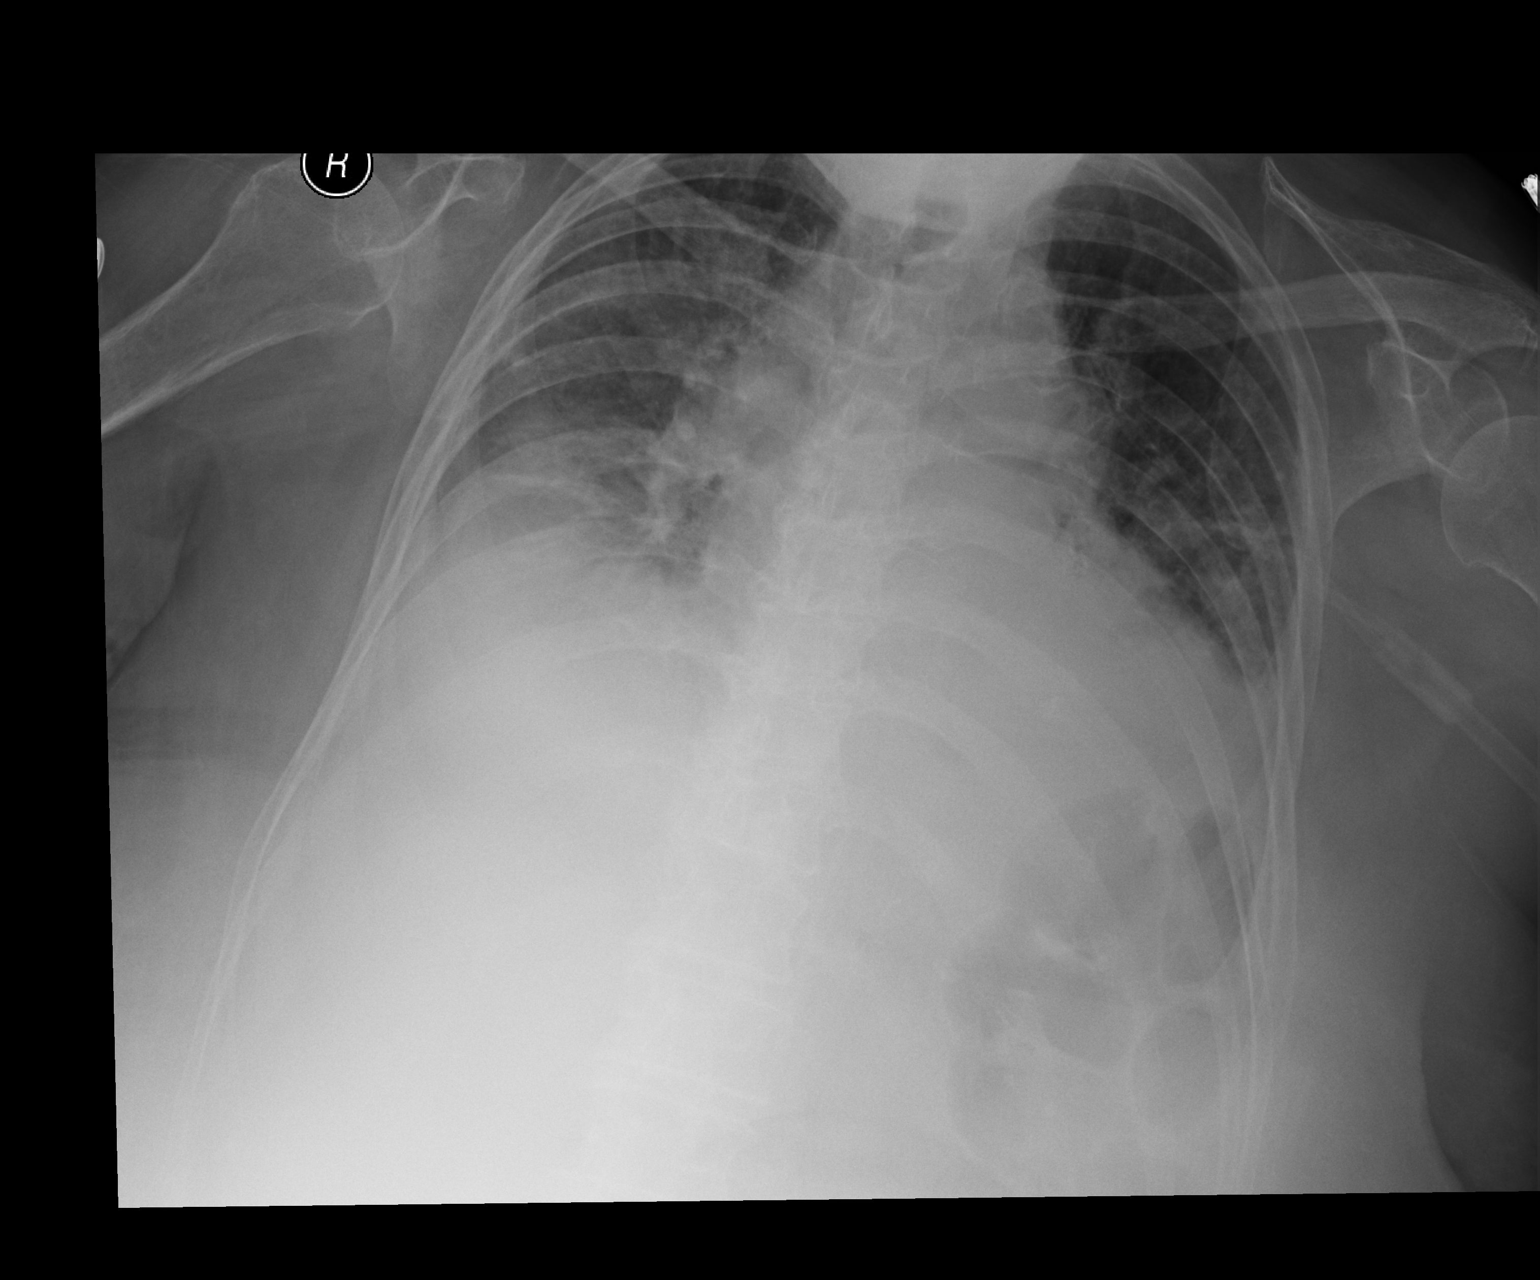

[1 of 1 positions shown; findings below may reference images not displayed]

FINDINGS: The cardiac silhouette is stable. There is bibasilar atelectasis and
low lung volumes. There is increase in bibasilar airspace disease
compared to prior. No pulmonary edema. No pleural fluid
IMPRESSION: 1.  Increased bibasilar airspace disease concerning for pneumonia.

2.  Low lung volumes.

## 2015-04-11 IMAGING — CR DG CHEST 1V PORT
1 series · 1 of 1 positions shown · non-contrast
Comparison: Single view of the chest 08/09/2013 and 08/06/2013.

CLINICAL DATA: Shortness of breath.

EXAM:
PORTABLE CHEST - 1 VIEW

[AP]
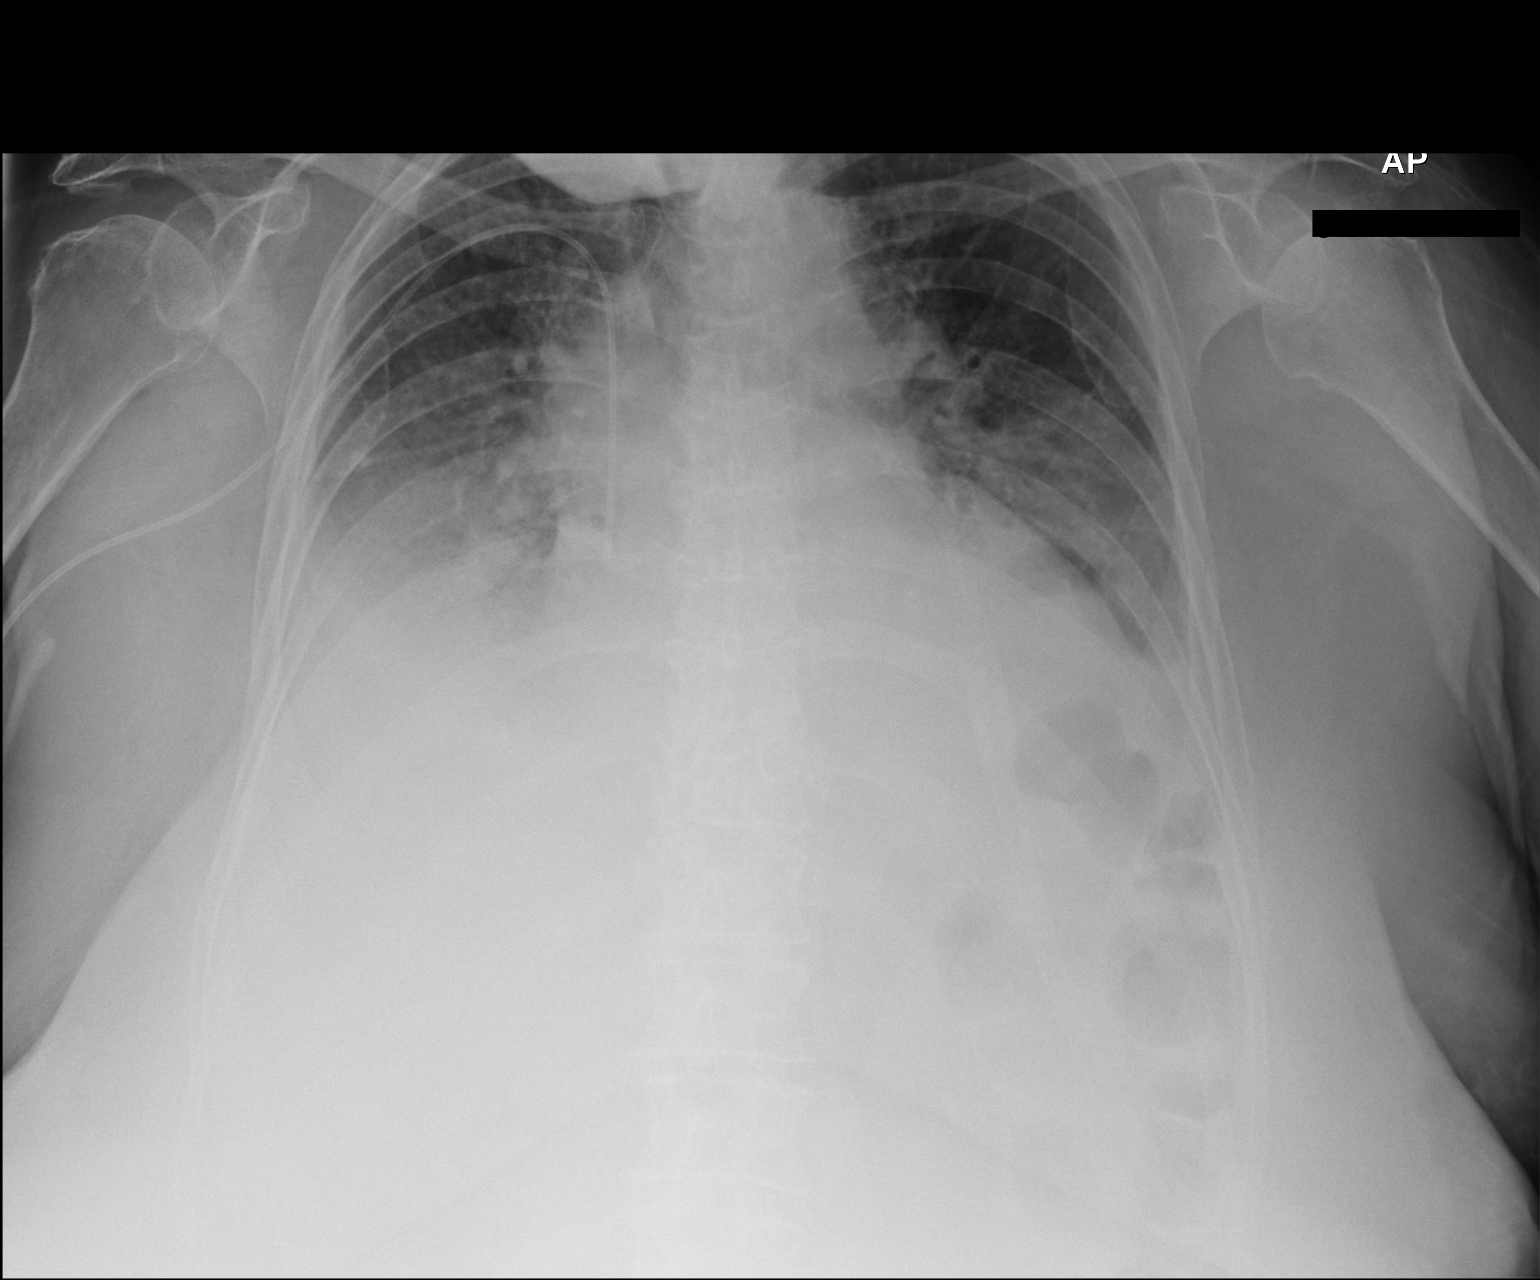

[1 of 1 positions shown; findings below may reference images not displayed]

FINDINGS: The patient has a new right PICC in place with the tip projecting
over the lower superior vena cava. Bilateral airspace disease, worse
on the right and likely pleural effusions persist without marked
change. Heart size is upper normal.
IMPRESSION: Tip of right PICC projects in the lower superior vena cava.

No marked change in effusions and airspace disease, worse on the
right.
